# Patient Record
Sex: Female | Born: 1975
Health system: Southern US, Community
[De-identification: ages and names within clinical notes are randomized; demographics above are authoritative.]

## PROBLEM LIST (undated history)

## (undated) DIAGNOSIS — F32A Depression, unspecified: Secondary | ICD-10-CM

## (undated) DIAGNOSIS — R87629 Unspecified abnormal cytological findings in specimens from vagina: Secondary | ICD-10-CM

## (undated) DIAGNOSIS — R519 Headache, unspecified: Secondary | ICD-10-CM

## (undated) DIAGNOSIS — Z8619 Personal history of other infectious and parasitic diseases: Secondary | ICD-10-CM

## (undated) DIAGNOSIS — M797 Fibromyalgia: Secondary | ICD-10-CM

## (undated) DIAGNOSIS — F419 Anxiety disorder, unspecified: Secondary | ICD-10-CM

## (undated) DIAGNOSIS — K589 Irritable bowel syndrome without diarrhea: Secondary | ICD-10-CM

## (undated) DIAGNOSIS — E785 Hyperlipidemia, unspecified: Secondary | ICD-10-CM

## (undated) HISTORY — DX: Personal history of other infectious and parasitic diseases: Z86.19

## (undated) HISTORY — DX: Hyperlipidemia, unspecified: E78.5

## (undated) HISTORY — PX: HERNIA REPAIR: SHX51

## (undated) HISTORY — DX: Fibromyalgia: M79.7

## (undated) HISTORY — PX: CHOLECYSTECTOMY: SHX55

## (undated) HISTORY — PX: OTHER SURGICAL HISTORY: SHX169

## (undated) HISTORY — DX: Anxiety disorder, unspecified: F41.9

## (undated) HISTORY — DX: Irritable bowel syndrome, unspecified: K58.9

## (undated) HISTORY — DX: Headache, unspecified: R51.9

## (undated) HISTORY — DX: Depression, unspecified: F32.A

## (undated) HISTORY — DX: Unspecified abnormal cytological findings in specimens from vagina: R87.629

---

## 1998-10-28 ENCOUNTER — Other Ambulatory Visit: Admission: RE | Admit: 1998-10-28 | Discharge: 1998-10-28 | Payer: Self-pay | Admitting: Obstetrics and Gynecology

## 1999-10-31 ENCOUNTER — Other Ambulatory Visit: Admission: RE | Admit: 1999-10-31 | Discharge: 1999-10-31 | Payer: Self-pay | Admitting: Obstetrics and Gynecology

## 2000-11-02 ENCOUNTER — Other Ambulatory Visit: Admission: RE | Admit: 2000-11-02 | Discharge: 2000-11-02 | Payer: Self-pay | Admitting: Obstetrics and Gynecology

## 2001-05-23 ENCOUNTER — Ambulatory Visit (HOSPITAL_COMMUNITY): Admission: RE | Admit: 2001-05-23 | Discharge: 2001-05-23 | Payer: Self-pay | Admitting: Obstetrics and Gynecology

## 2001-05-23 ENCOUNTER — Encounter: Payer: Self-pay | Admitting: Obstetrics and Gynecology

## 2001-11-15 ENCOUNTER — Other Ambulatory Visit: Admission: RE | Admit: 2001-11-15 | Discharge: 2001-11-15 | Payer: Self-pay | Admitting: Obstetrics and Gynecology

## 2002-10-30 ENCOUNTER — Other Ambulatory Visit: Admission: RE | Admit: 2002-10-30 | Discharge: 2002-10-30 | Payer: Self-pay | Admitting: Obstetrics and Gynecology

## 2003-07-10 ENCOUNTER — Other Ambulatory Visit: Admission: RE | Admit: 2003-07-10 | Discharge: 2003-07-10 | Payer: Self-pay | Admitting: Obstetrics and Gynecology

## 2004-02-05 ENCOUNTER — Inpatient Hospital Stay (HOSPITAL_COMMUNITY): Admission: AD | Admit: 2004-02-05 | Discharge: 2004-02-08 | Payer: Self-pay | Admitting: Obstetrics and Gynecology

## 2006-01-19 ENCOUNTER — Inpatient Hospital Stay (HOSPITAL_COMMUNITY): Admission: AD | Admit: 2006-01-19 | Discharge: 2006-01-21 | Payer: Self-pay | Admitting: Obstetrics and Gynecology

## 2006-01-26 ENCOUNTER — Inpatient Hospital Stay (HOSPITAL_COMMUNITY): Admission: RE | Admit: 2006-01-26 | Discharge: 2006-01-28 | Payer: Self-pay | Admitting: Obstetrics and Gynecology

## 2009-10-08 ENCOUNTER — Encounter: Admission: RE | Admit: 2009-10-08 | Discharge: 2010-01-06 | Payer: Self-pay | Admitting: Orthopedic Surgery

## 2010-08-10 NOTE — L&D Delivery Note (Signed)
Delivery Note At  a viable and healthy female sex was delivered via  (Presentation:LOA ).  APGAR:9 , 9; weight pending.   Placenta status: spontaneous and intact with 3 vessel Cord:  with the following complications: none.  Cord pH: na  Anesthesia: Epidural  Episiotomy: none Lacerations: 1st degree Suture Repair: 2.0 vicryl rapide Est. Blood Loss (mL): 500  Mom to postpartum.  Baby to nursery-stable.  Deira Shimer J 06/14/2011, 5:56 PM

## 2010-11-12 LAB — RUBELLA ANTIBODY, IGM: Rubella: IMMUNE

## 2010-11-12 LAB — ABO/RH: RH Type: POSITIVE

## 2010-11-12 LAB — RPR: RPR: NONREACTIVE

## 2010-11-12 LAB — ANTIBODY SCREEN: Antibody Screen: NEGATIVE

## 2010-11-12 LAB — HIV ANTIBODY (ROUTINE TESTING W REFLEX): HIV: NONREACTIVE

## 2010-11-12 LAB — HEPATITIS B SURFACE ANTIGEN: Hepatitis B Surface Ag: NEGATIVE

## 2010-12-26 NOTE — Consult Note (Signed)
NAME:  HALIEY, ROMBERG NO.:  [id number]   MEDICAL RECORD NO.:  [id number]          PATIENT TYPE:  INP   LOCATION:  9155                          FACILITY:  [facility]   PHYSICIAN:  Lebron Conners, M.D.   DATE OF BIRTH:  [birth year]   DATE OF CONSULTATION:  DATE OF DISCHARGE:                                   CONSULTATION   Audio too short to transcribe (less than 5 seconds)      Lebron Conners, M.D.     WB/MEDQ  D:  01/19/2006  T:  01/19/2006  Job:  [id number]

## 2010-12-26 NOTE — H&P (Signed)
NAME:  Tricia [name], Tricia [name]              ACCOUNT NO.:  [id number]   MEDICAL RECORD NO.:  [id number]          PATIENT TYPE:  INP   LOCATION:  9171                          FACILITY:  [facility]   PHYSICIAN:  Lenoard Aden, M.D.DATE OF BIRTH:  Feb 03, 1976   DATE OF ADMISSION:  01/26/2006  DATE OF DISCHARGE:                                HISTORY & PHYSICAL   CHIEF COMPLAINT:  Induction.   HISTORY OF PRESENT ILLNESS:  This is a 35 year old white female, G2, P48, EDD  is February 14, 2006, at [redacted] weeks gestation who presents with a one-week history  of worsening of biliary colic, known gallstones and maintenance with  narcotics. She has a history of spontaneous vaginal delivery x1. She is a  nonsmoker, nondrinker. She denies __________ .   MEDICATIONS:  Prenatal vitamins and Vicodin p.r.n.   PAST SURGICAL HISTORY:  She has a history of inguinal hernia repair, history  of wisdom tooth removal.   PHYSICAL EXAMINATION:  GENERAL:  She is a well-developed, well-nourished,  white female in no acute distress.  HEENT:  Normal.  LUNGS:  Clear.  HEART:  Regular rhythm.  ABDOMEN:  Soft, gravid, nontender. Estimated fetal weight 6-1/2 pounds.  Cervix is 2 to 3 cm, 50% vertex, -2.  EXTREMITIES:  Showed __________ .  NEUROLOGICAL:  Nonfocal.   IMPRESSION:  9.  A 35 year old week OB.  2.  Cholecystitis secondary to biliary colic and need for continued narcotic      use.   PLAN:  Will proceed with induction. Risks and benefits discussed. Small risk  of prematurity given [redacted] week gestational age noted. The patient acknowledged  this and will proceed. Pending delivery, will have general surgery  consulting the patient and consider interval cholecystectomy.      Lenoard Aden, M.D.  Electronically Signed     RJT/MEDQ  D:  01/26/2006  T:  01/26/2006  Job:  [id number]

## 2010-12-26 NOTE — Discharge Summary (Signed)
NAME:  Tricia [name], Tricia [name]              ACCOUNT NO.:  [id number]   MEDICAL RECORD NO.:  [id number]          PATIENT TYPE:  INP   LOCATION:  9155                          [facility]ILITY:  [facility]   PHYSICIAN:  Lenoard Aden, M.D.DATE OF BIRTH:  09/10/1975   DATE OF ADMISSION:  01/19/2006  DATE OF DISCHARGE:  01/21/2006                                 DISCHARGE SUMMARY   Patient was admitted with acute biliary colic on January 19, 2006.  Surgical  consult obtained.  Patient was placed on antibiotics and bowel rest and  subsequently improved.  Tolerated regular diet well on hospital day #2.  Fetal heart rate tracing reassuring.  She is discharged to home at this  time.  Surgical follow-up scheduled.  Follow up for obstetric care within  one week.  Fetal activity discussed.  Labor warnings given.  Discharge  teaching done.  Discharge medications to include prenatal vitamins.      Lenoard Aden, M.D.  Electronically Signed     RJT/MEDQ  D:  03/02/2006  T:  03/03/2006  Job:  [id number]

## 2011-05-13 LAB — STREP B DNA PROBE: GBS: NEGATIVE

## 2011-06-09 ENCOUNTER — Encounter (HOSPITAL_COMMUNITY): Payer: Self-pay | Admitting: *Deleted

## 2011-06-09 ENCOUNTER — Telehealth (HOSPITAL_COMMUNITY): Payer: Self-pay | Admitting: *Deleted

## 2011-06-09 NOTE — Telephone Encounter (Signed)
Preadmission screen  

## 2011-06-13 ENCOUNTER — Inpatient Hospital Stay (HOSPITAL_COMMUNITY): Admission: AD | Admit: 2011-06-13 | Payer: Self-pay | Source: Ambulatory Visit | Admitting: Obstetrics and Gynecology

## 2011-06-14 ENCOUNTER — Inpatient Hospital Stay (HOSPITAL_COMMUNITY)
Admission: RE | Admit: 2011-06-14 | Discharge: 2011-06-16 | DRG: 775 | Disposition: A | Discharge status: Home / Self Care | Payer: Managed Care, Other (non HMO) | Source: Ambulatory Visit | Attending: Obstetrics & Gynecology | Admitting: Obstetrics & Gynecology

## 2011-06-14 ENCOUNTER — Encounter (HOSPITAL_COMMUNITY): Payer: Self-pay | Admitting: Anesthesiology

## 2011-06-14 ENCOUNTER — Inpatient Hospital Stay (HOSPITAL_COMMUNITY): Payer: Managed Care, Other (non HMO) | Admitting: Anesthesiology

## 2011-06-14 ENCOUNTER — Encounter (HOSPITAL_COMMUNITY): Payer: Self-pay

## 2011-06-14 DIAGNOSIS — O9903 Anemia complicating the puerperium: Secondary | ICD-10-CM | POA: Diagnosis not present

## 2011-06-14 DIAGNOSIS — O480 Post-term pregnancy: Principal | ICD-10-CM | POA: Diagnosis present

## 2011-06-14 DIAGNOSIS — D62 Acute posthemorrhagic anemia: Secondary | ICD-10-CM | POA: Diagnosis not present

## 2011-06-14 LAB — CBC
HCT: 34.7 % — ABNORMAL LOW (ref 36.0–46.0)
Hemoglobin: 11.4 g/dL — ABNORMAL LOW (ref 12.0–15.0)
MCH: 30.2 pg (ref 26.0–34.0)
MCHC: 32.9 g/dL (ref 30.0–36.0)
MCV: 91.8 fL (ref 78.0–100.0)
Platelets: 272 K/uL (ref 150–400)
RBC: 3.78 MIL/uL — ABNORMAL LOW (ref 3.87–5.11)
RDW: 14.8 % (ref 11.5–15.5)
WBC: 10.7 K/uL — ABNORMAL HIGH (ref 4.0–10.5)

## 2011-06-14 LAB — RPR: RPR Ser Ql: NONREACTIVE

## 2011-06-14 MED ORDER — OXYCODONE-ACETAMINOPHEN 5-325 MG PO TABS: 2.0000 | ORAL_TABLET | ORAL | Status: DC | PRN

## 2011-06-14 MED ORDER — ACETAMINOPHEN 325 MG PO TABS: 650.0000 mg | ORAL_TABLET | ORAL | Status: DC | PRN

## 2011-06-14 MED ORDER — PHENYLEPHRINE 40 MCG/ML (10ML) SYRINGE FOR IV PUSH (FOR BLOOD PRESSURE SUPPORT): 80.0000 ug | PREFILLED_SYRINGE | INTRAVENOUS | Status: DC | PRN

## 2011-06-14 MED ORDER — LACTATED RINGERS IV SOLN
500.0000 mL | Freq: Once | INTRAVENOUS | Status: AC
  Administered 2011-06-14: 1000 mL via INTRAVENOUS

## 2011-06-14 MED ORDER — BENZOCAINE-MENTHOL 20-0.5 % EX AERO
1.0000 "application " | INHALATION_SPRAY | CUTANEOUS | Status: DC | PRN
  Administered 2011-06-14: 1 via TOPICAL

## 2011-06-14 MED ORDER — LIDOCAINE HCL (PF) 1 % IJ SOLN: 30.0000 mL | INTRAMUSCULAR | Status: DC | PRN

## 2011-06-14 MED ORDER — METHYLERGONOVINE MALEATE 0.2 MG PO TABS: 0.2000 mg | ORAL_TABLET | ORAL | Status: DC | PRN

## 2011-06-14 MED ORDER — EPHEDRINE 5 MG/ML INJ
10.0000 mg | INTRAVENOUS | Status: DC | PRN
  Filled 2011-06-14: qty 4

## 2011-06-14 MED ORDER — IBUPROFEN 600 MG PO TABS: 600.0000 mg | ORAL_TABLET | Freq: Four times a day (QID) | ORAL | Status: DC | PRN

## 2011-06-14 MED ORDER — SIMETHICONE 80 MG PO CHEW: 80.0000 mg | CHEWABLE_TABLET | ORAL | Status: DC | PRN

## 2011-06-14 MED ORDER — LANOLIN HYDROUS EX OINT: TOPICAL_OINTMENT | CUTANEOUS | Status: DC | PRN

## 2011-06-14 MED ORDER — SENNOSIDES-DOCUSATE SODIUM 8.6-50 MG PO TABS
2.0000 | ORAL_TABLET | Freq: Every day | ORAL | Status: DC
  Administered 2011-06-14 – 2011-06-15 (×2): 2 via ORAL

## 2011-06-14 MED ORDER — FLEET ENEMA 7-19 GM/118ML RE ENEM: 1.0000 | ENEMA | RECTAL | Status: DC | PRN

## 2011-06-14 MED ORDER — ONDANSETRON HCL 4 MG/2ML IJ SOLN: 4.0000 mg | Freq: Four times a day (QID) | INTRAMUSCULAR | Status: DC | PRN

## 2011-06-14 MED ORDER — PRENATAL PLUS 27-1 MG PO TABS
1.0000 | ORAL_TABLET | Freq: Every day | ORAL | Status: DC
  Administered 2011-06-15: 1 via ORAL
  Filled 2011-06-14 (×2): qty 1

## 2011-06-14 MED ORDER — LACTATED RINGERS IV SOLN: 500.0000 mL | INTRAVENOUS | Status: DC | PRN

## 2011-06-14 MED ORDER — FENTANYL 2.5 MCG/ML BUPIVACAINE 1/10 % EPIDURAL INFUSION (WH - ANES)
INTRAMUSCULAR | Status: DC | PRN
  Administered 2011-06-14: 14 mL/h via EPIDURAL

## 2011-06-14 MED ORDER — ONDANSETRON HCL 4 MG/2ML IJ SOLN: 4.0000 mg | INTRAMUSCULAR | Status: DC | PRN

## 2011-06-14 MED ORDER — CITRIC ACID-SODIUM CITRATE 334-500 MG/5ML PO SOLN: 30.0000 mL | ORAL | Status: DC | PRN

## 2011-06-14 MED ORDER — FENTANYL 2.5 MCG/ML BUPIVACAINE 1/10 % EPIDURAL INFUSION (WH - ANES)
14.0000 mL/h | INTRAMUSCULAR | Status: DC
  Filled 2011-06-14: qty 60

## 2011-06-14 MED ORDER — DIPHENHYDRAMINE HCL 50 MG/ML IJ SOLN: 12.5000 mg | INTRAMUSCULAR | Status: DC | PRN

## 2011-06-14 MED ORDER — TETANUS-DIPHTH-ACELL PERTUSSIS 5-2.5-18.5 LF-MCG/0.5 IM SUSP: 0.5000 mL | Freq: Once | INTRAMUSCULAR | Status: DC

## 2011-06-14 MED ORDER — OXYCODONE-ACETAMINOPHEN 5-325 MG PO TABS: 1.0000 | ORAL_TABLET | ORAL | Status: DC | PRN

## 2011-06-14 MED ORDER — OXYTOCIN 20 UNITS IN LACTATED RINGERS INFUSION - SIMPLE
125.0000 mL/h | Freq: Once | INTRAVENOUS | Status: AC
  Administered 2011-06-14: 999 mL/h via INTRAVENOUS

## 2011-06-14 MED ORDER — IBUPROFEN 600 MG PO TABS
600.0000 mg | ORAL_TABLET | Freq: Four times a day (QID) | ORAL | Status: DC
  Administered 2011-06-14 – 2011-06-16 (×6): 600 mg via ORAL
  Filled 2011-06-14 (×6): qty 1

## 2011-06-14 MED ORDER — ZOLPIDEM TARTRATE 5 MG PO TABS: 5.0000 mg | ORAL_TABLET | Freq: Every evening | ORAL | Status: DC | PRN

## 2011-06-14 MED ORDER — PHENYLEPHRINE 40 MCG/ML (10ML) SYRINGE FOR IV PUSH (FOR BLOOD PRESSURE SUPPORT)
80.0000 ug | PREFILLED_SYRINGE | INTRAVENOUS | Status: DC | PRN
  Filled 2011-06-14: qty 5

## 2011-06-14 MED ORDER — DIPHENHYDRAMINE HCL 25 MG PO CAPS: 25.0000 mg | ORAL_CAPSULE | Freq: Four times a day (QID) | ORAL | Status: DC | PRN

## 2011-06-14 MED ORDER — WITCH HAZEL-GLYCERIN EX PADS: 1.0000 "application " | MEDICATED_PAD | CUTANEOUS | Status: DC | PRN

## 2011-06-14 MED ORDER — LACTATED RINGERS IV SOLN
INTRAVENOUS | Status: DC
  Administered 2011-06-14: 11:00:00 via INTRAVENOUS

## 2011-06-14 MED ORDER — BENZOCAINE-MENTHOL 20-0.5 % EX AERO
INHALATION_SPRAY | CUTANEOUS | Status: AC
  Administered 2011-06-14: 1 via TOPICAL
  Filled 2011-06-14: qty 56

## 2011-06-14 MED ORDER — ONDANSETRON HCL 4 MG PO TABS: 4.0000 mg | ORAL_TABLET | ORAL | Status: DC | PRN

## 2011-06-14 MED ORDER — EPHEDRINE 5 MG/ML INJ: 10.0000 mg | INTRAVENOUS | Status: DC | PRN

## 2011-06-14 MED ORDER — OXYTOCIN 20 UNITS IN LACTATED RINGERS INFUSION - SIMPLE
1.0000 m[IU]/min | INTRAVENOUS | Status: DC
  Administered 2011-06-14: 2 m[IU]/min via INTRAVENOUS
  Filled 2011-06-14: qty 1000

## 2011-06-14 MED ORDER — TERBUTALINE SULFATE 1 MG/ML IJ SOLN: 0.2500 mg | Freq: Once | INTRAMUSCULAR | Status: DC | PRN

## 2011-06-14 MED ORDER — OXYTOCIN BOLUS FROM INFUSION
500.0000 mL | Freq: Once | INTRAVENOUS | Status: DC
  Filled 2011-06-14: qty 500

## 2011-06-14 MED ORDER — DIBUCAINE 1 % RE OINT: 1.0000 "application " | TOPICAL_OINTMENT | RECTAL | Status: DC | PRN

## 2011-06-14 MED ORDER — SODIUM BICARBONATE 8.4 % IV SOLN
INTRAVENOUS | Status: DC | PRN
  Administered 2011-06-14: 4 mL via EPIDURAL

## 2011-06-14 MED ORDER — METHYLERGONOVINE MALEATE 0.2 MG/ML IJ SOLN: 0.2000 mg | INTRAMUSCULAR | Status: DC | PRN

## 2011-06-14 NOTE — Anesthesia Preprocedure Evaluation (Signed)
Anesthesia Evaluation  Patient identified by MRN, date of birth, ID band Patient awake    Reviewed: Allergy & Precautions, H&P , Patient's Chart, lab work & pertinent test results  Airway Mallampati: II TM Distance: >3 FB Neck ROM: full    Dental  (+) Teeth Intact   Pulmonary  clear to auscultation        Cardiovascular regular Normal    Neuro/Psych    GI/Hepatic   Endo/Other  Morbid obesity  Renal/GU      Musculoskeletal   Abdominal   Peds  Hematology   Anesthesia Other Findings       Reproductive/Obstetrics (+) Pregnancy                           Anesthesia Physical Anesthesia Plan  ASA: III  Anesthesia Plan: Epidural   Post-op Pain Management:    Induction:   Airway Management Planned:   Additional Equipment:   Intra-op Plan:   Post-operative Plan:   Informed Consent: I have reviewed the patients History and Physical, chart, labs and discussed the procedure including the risks, benefits and alternatives for the proposed anesthesia with the patient or authorized representative who has indicated his/her understanding and acceptance.   Dental Advisory Given  Plan Discussed with:   Anesthesia Plan Comments: (Labs checked- platelets confirmed with RN in room. Fetal heart tracing, per RN, reported to be stable enough for sitting procedure. Discussed epidural, and patient consents to the procedure:  included risk of possible headache,backache, failed block, allergic reaction, and nerve injury. This patient was asked if she had any questions or concerns before the procedure started. )        Anesthesia Quick Evaluation

## 2011-06-14 NOTE — Progress Notes (Signed)
Tricia [name] is a 35 y.o. [id number] at [redacted]w[redacted]d by LMP admitted for induction of labor due to postterm IUP.  Subjective:   Objective: BP 121/82  Pulse 75  Temp(Src) 98.4 F (36.9 C) (Oral)  Resp 20  Ht 5\' 1"  (1.549 m)  Wt 88.905 kg (196 lb)  BMI 37.03 kg/m2  LMP 08/29/2010      FHT:  FHR: 155 bpm, variability: moderate,  accelerations:  Present,  decelerations:  Absent UC:   irregular, every 4-5 minutes SVE:   Dilation: 3 Effacement (%): 50 Station: -2 Exam by:: k fields, rn AROM- clear  Labs: Lab Results  Component Value Date   WBC 10.7* 06/14/2011   HGB 11.4* 06/14/2011   HCT 34.7* 06/14/2011   MCV 91.8 06/14/2011   PLT 272 06/14/2011    Assessment / Plan: Induction of labor due to postterm,  progressing well on pitocin  Labor: Progressing on Pitocin, will continue to increase then AROM Preeclampsia:  na Fetal Wellbeing:  Category I Pain Control:  Labor support without medications and Epidural I/D:  n/a Anticipated MOD:  NSVD  Matthias Bogus J 06/14/2011, 1:12 PM

## 2011-06-14 NOTE — H&P (Signed)
Tricia [name], LOCHNER NO.:  [id number]  MEDICAL RECORD NO.:  [id number]  LOCATION:  9167                          FACILITY:  [facility]  PHYSICIAN:  Lenoard Aden, M.D.DATE OF BIRTH:  [birth year]  DATE OF ADMISSION:  06/14/2011 DATE OF DISCHARGE:                             HISTORY & PHYSICAL   CHIEF COMPLAINT:  Induction for postterm.  HISTORY OF PRESENT ILLNESS:  She is a 35 year old white female, G4, P2 at 61 weeks' gestation with favorable cervix induction.  ALLERGIES:  She has allergies to SULFA.  MEDICATIONS:  Prenatal vitamins.  PAST MEDICAL HISTORY:  She has a personal history of sinuitis.  SURGICAL HISTORY:  Remarkable for a wisdom tooth removal, inguinal hernia repair, and cholecystectomy.  SOCIAL HISTORY:  She is a nonsmoker, nondrinker.  She denies domestic or physical violence.  OB/GYN HISTORY:  She has a history of 2 term vaginal deliveries and one SAP.  FAMILY HISTORY:  Parkinson disease and chronic hypertension.  PRENATAL COURSE:  Uncomplicated.  PHYSICAL EXAMINATION:  GENERAL:  A well-developed, well-nourished female in no acute distress. HEENT:  Normal. NECK:  Supple.  Full range of motion. LUNGS:  Clear. HEART:  Regular rate and rhythm. ABDOMEN:  Soft, gravid, and nontender.  Fetal weight 7 to 7-1/2 pounds. Cervix is 3 cm, 100% vertex, -1. EXTREMITIES:  No cords. NEUROLOGIC:  Nonfocal. SKIN:  Intact.  IMPRESSION:  Postterm intrauterine pregnancy with favorable cervix induction.  PLAN:  Continue Pitocin epidural as needed.     Lenoard Aden, M.D.     RJT/MEDQ  D:  06/14/2011  T:  06/14/2011  Job:  [id number]

## 2011-06-14 NOTE — Anesthesia Procedure Notes (Signed)

## 2011-06-15 LAB — CBC
MCH: 30.5 pg (ref 26.0–34.0)
MCV: 91.7 fL (ref 78.0–100.0)
Platelets: 227 10*3/uL (ref 150–400)
RBC: 3.15 MIL/uL — ABNORMAL LOW (ref 3.87–5.11)

## 2011-06-15 MED ORDER — POLYSACCHARIDE IRON 150 MG PO CAPS
150.0000 mg | ORAL_CAPSULE | Freq: Every day | ORAL | Status: DC
  Administered 2011-06-15: 150 mg via ORAL
  Filled 2011-06-15 (×2): qty 1

## 2011-06-15 MED ORDER — DOCUSATE SODIUM 100 MG PO CAPS
100.0000 mg | ORAL_CAPSULE | Freq: Every day | ORAL | Status: DC
  Administered 2011-06-15: 100 mg via ORAL
  Filled 2011-06-15 (×2): qty 1

## 2011-06-15 NOTE — Progress Notes (Signed)
  PPD 1 SVD  S: Reports feeling well - minimal discomfort with cramping            Tolerating po/ No nausea or vomiting            Bleeding is moderate            Pain controlled prescription NSAID's including motrin and narcotic analgesics including percocet            Up ad lib / ambulatory            Newborn breast feeding  / female   O:  A & O x 3 NAD             VS: Blood pressure 99/66, pulse 82, temperature 98.1 F (36.7 C), temperature source Oral, resp. rate 18, height 5\' 1"  (1.549 m), weight 88.905 kg (196 lb), last menstrual period 08/29/2010, SpO2 98.00%, unknown if currently breastfeeding.  LABS: Lab Results  Component Value Date   WBC 14.2* 06/15/2011   HGB 9.6* 06/15/2011   HCT 28.9* 06/15/2011   MCV 91.7 06/15/2011   PLT 227 06/15/2011     Lungs: Clear and unlabored  Heart: regular rate and rhythm / no mumurs  Abdomen: soft, non-tender, non-distended             Fundus: firm, non-tender, Ueven  Perineum: mild edema - ice pack in place  Lochia: moderate  Extremities: trace edema, no calf pain or tenderness    A: PPD # 1 SVD female             Mild anemia   Doing well - stable status  P:  Routine post partum orders              Start iron (continue x 4 weeks)  Anticipate discharge in am  BAILEY,TANYA 06/15/2011, 8:59 AM

## 2011-06-15 NOTE — Anesthesia Postprocedure Evaluation (Signed)
Anesthesia Post Note  Patient: Tricia [name]  Procedure(s) Performed: * No procedures listed *  Anesthesia type: Epidural  Patient location: Mother/Baby  Post pain: Pain level controlled  Post assessment: Post-op Vital signs reviewed  Last Vitals:  Filed Vitals:   06/15/11 1412  BP: 113/81  Pulse: 71  Temp: 36.6 C  Resp: 18    Post vital signs: Reviewed  Level of consciousness:alert  Complications: No apparent anesthesia complications

## 2011-06-15 NOTE — Addendum Note (Signed)
Addendum  created 06/15/11 1715 by Rachell Druckenmiller   Modules edited:Charges VN, Notes Section    

## 2011-06-15 NOTE — Addendum Note (Signed)
Addendum  created 06/15/11 1715 by Cephus Shelling   Modules edited:Charges VN, Notes Section

## 2011-06-16 MED ORDER — POLYSACCHARIDE IRON 150 MG PO CAPS: 150.0000 mg | ORAL_CAPSULE | Freq: Every day | ORAL | Status: DC

## 2011-06-16 MED ORDER — OXYCODONE-ACETAMINOPHEN 5-325 MG PO TABS: 1.0000 | ORAL_TABLET | Freq: Four times a day (QID) | ORAL | Status: AC | PRN

## 2011-06-16 NOTE — Discharge Summary (Signed)
Obstetric Discharge Summary Reason for Admission: G4, P2 for induction of labor d/t post dates Prenatal Procedures: ultrasound Intrapartum Procedures: SVD with 1st degree repair Postpartum Procedures: none Complications-Operative and Postpartum: none Hemoglobin  Date Value Range Status  06/15/2011 9.6* 12.0-15.0 (g/dL) Final     HCT  Date Value Range Status  06/15/2011 28.9* 36.0-46.0 (%) Final    Discharge Diagnoses: Term Pregnancy-delivered  Discharge Information: Date: 06/16/2011 Activity: pelvic rest Diet: routine Medications: Ibuprofen Condition: stable Instructions: refer to practice specific booklet Discharge to: home   Newborn Data: Live born female on 05/13/11 Birth Weight: 6 lb 14 oz (3118 g) APGAR: 9, 9  Home with mother.  Genessa Beman K 06/16/2011, 8:34 AM

## 2011-06-16 NOTE — Progress Notes (Signed)
  PPD # 2  Subjective: Pt reports feeling well and eager for d/c home/ Pain controlled with motrin and percocet Tolerating po/ Voiding without problems/ No n/v Bleeding is light/ Newborn info:  Information for the patient's newborn:  Dimaano, Girl Lakshmi [[id number]]  female  / Feeding: breast    Objective:  VS: Blood pressure 116/81, pulse 91, temperature 98.1 F (36.7 C), temperature source Oral, resp. rate 18   Basename 06/15/11 0530 06/14/11 1015  WBC 14.2* 10.7*  HGB 9.6* 11.4*  HCT 28.9* 34.7*  PLT 227 272    Blood type: --/--/O POS (11/04 1015) Rubella: Immune (04/04 0000)    Physical Exam:  General: alert, cooperative and no distress Abdomen: soft, nontender, normal bowel sounds Uterine Fundus: firm, below umbilicus, nontender Perineum: mild edema Lochia: minimal Ext: Homans sign is negative, no sign of DVT and no edema, redness or tenderness in the calves or thighs   A/P: PPD # 2/ [id number] S/P SVD Mild ABL Anemia Doing well and stable for discharge home RX: Ibuprofen 600mg  po Q 6 hrs prn pain #30 Refill x 1 Percocet 5/325 1 to 2 po Q 4 hrs prn pain #15 No refill WOB/GYN booklet given Routine pp visit in 6wks

## 2013-11-01 ENCOUNTER — Other Ambulatory Visit: Payer: Self-pay | Admitting: *Deleted

## 2013-11-01 DIAGNOSIS — R7309 Other abnormal glucose: Secondary | ICD-10-CM

## 2013-11-03 ENCOUNTER — Other Ambulatory Visit: Payer: 59

## 2013-11-03 ENCOUNTER — Other Ambulatory Visit: Payer: Self-pay | Admitting: Obstetrics

## 2013-11-03 DIAGNOSIS — R7309 Other abnormal glucose: Secondary | ICD-10-CM

## 2013-11-06 LAB — GLUCOSE TOLERANCE, 2 HOURS W/ 1HR
GLUCOSE, FASTING: 75 mg/dL (ref 70–99)
Glucose, 1 hour: 96 mg/dL (ref 70–170)
Glucose, 2 hour: 78 mg/dL (ref 70–139)

## 2013-12-04 ENCOUNTER — Other Ambulatory Visit: Payer: Self-pay | Admitting: Obstetrics

## 2013-12-04 DIAGNOSIS — J329 Chronic sinusitis, unspecified: Secondary | ICD-10-CM

## 2013-12-04 DIAGNOSIS — E669 Obesity, unspecified: Secondary | ICD-10-CM

## 2013-12-04 MED ORDER — AMOXICILLIN-POT CLAVULANATE 875-125 MG PO TABS: 1.0000 | ORAL_TABLET | Freq: Two times a day (BID) | ORAL | Status: DC

## 2013-12-04 MED ORDER — PHENTERMINE HCL 37.5 MG PO CAPS
37.5000 mg | ORAL_CAPSULE | ORAL | Status: DC
Start: 2013-12-04 — End: 2016-03-30

## 2014-01-16 ENCOUNTER — Ambulatory Visit: Payer: Managed Care, Other (non HMO) | Admitting: *Deleted

## 2014-03-01 ENCOUNTER — Encounter: Payer: Self-pay | Admitting: *Deleted

## 2014-03-01 ENCOUNTER — Encounter: Payer: 59 | Attending: Obstetrics and Gynecology | Admitting: *Deleted

## 2014-03-01 DIAGNOSIS — Z713 Dietary counseling and surveillance: Secondary | ICD-10-CM | POA: Diagnosis present

## 2014-03-01 DIAGNOSIS — E7800 Pure hypercholesterolemia, unspecified: Secondary | ICD-10-CM | POA: Diagnosis present

## 2014-03-01 NOTE — Progress Notes (Signed)
  Medical Nutrition Therapy:  Appt start time: 0800 end time:  0900.  Assessment:  Primary concerns today: Tricia [name] is here for nutrition counseling. 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  She is interested in general nutrition counseling: what to eat, what not to eat, how to read labels, etc.  She would like for her family to eat healthier.  She states that her total cholesterol is slightly elevated (211 mg/dl).   Tricia [name] does the grocery shopping for her household.  The family eats out a fairy amount and don't cook much at home. (4 nights/week)  Tricia [name] and her husband share cooking responsibilities.  They bake or grill most of the time and rarely fry.  When at home, they eat in the living room while watching tv; she thinks she is a slow eater.    Preferred Learning Style:   Auditory  Visual   Learning Readiness:   Contemplating   MEDICATIONS: see list   DIETARY INTAKE:  Usual eating pattern includes 1-3 meals and 0-3 snacks per day.  Everyday foods include proteins, starches.  Avoided foods include none.    24-hr recall:  B ( AM): granola bar or 1/2 egg sandwich from fast food.  Might skip .  On weekends might have bowl cereal or oatmeal.  Sometimes goes out and gets eggs and toast (even though she really wants the french toast) Snk ( AM): not at work  L ( PM): might have drug rep bring in something: soup and sandwich; if she brings from home she might have lean cousine with goldfish and applesauce.  Might skip on weekends.  Big lunch at [place] ( PM): not at work unless she eats leftover goldfish from lunch.  Grazes more on the weekends on popcorn or granola bar D ( PM): spaghetti or tacos if cook at home.  [place], pizza, [place].  .  Take out on weekends.  Sometimes light dinner on sundays due to big lunch Snk ( PM): not usually.  Might have a little pudding cup or popsicle Beverages: water with  sugar-free mix in, tea (excessive)  Usual physical activity: none outside of ADLs  Estimated energy needs: 1800 calories 200 g carbohydrates 135 g protein 50 g fat    Nutritional Diagnosis:  NB-1.1 Food and nutrition-related knowledge deficit As related to proper balance of fats, carbohydrates, and proteins.  As evidenced by patient self-reported knowledge deficit.    Intervention:  Nutrition counseling provideded.  Discussed saturated, unsaturated, and trans fats and their roles on heart health.  Recommended limiting saturated and trans fats.  Discussed reading food labels for these fats and also fiber.  Suggested planning meals in advance so the family doesn't have to eat out as often during the week.  Tricia [name] agrees this is a good idea, but she doesn't seem to think it is possible to plan out in advance.    Teaching Method Utilized:  Visual Auditory   Handouts given during visit include:  High Cholesterol Nutrition Therapy  Reading Food Labels  Barriers to learning/adherence to lifestyle change: prioritizing meal planning  Demonstrated degree of understanding via:  Teach Back   Monitoring/Evaluation:  Dietary intake, exercise, and body weight prn.

## 2014-06-11 ENCOUNTER — Encounter: Payer: Self-pay | Admitting: *Deleted

## 2014-07-09 ENCOUNTER — Other Ambulatory Visit: Payer: Self-pay | Admitting: Obstetrics

## 2014-07-09 DIAGNOSIS — R05 Cough: Secondary | ICD-10-CM

## 2014-07-09 DIAGNOSIS — R059 Cough, unspecified: Secondary | ICD-10-CM

## 2014-07-09 MED ORDER — GUAIFENESIN-CODEINE 100-10 MG/5ML PO SOLN: 10.0000 mL | Freq: Four times a day (QID) | ORAL | Status: DC | PRN

## 2014-07-23 ENCOUNTER — Other Ambulatory Visit: Payer: Self-pay | Admitting: *Deleted

## 2014-07-23 DIAGNOSIS — J069 Acute upper respiratory infection, unspecified: Secondary | ICD-10-CM

## 2014-07-23 MED ORDER — AZITHROMYCIN 1 G PO PACK: 1.0000 g | PACK | Freq: Once | ORAL | Status: DC

## 2014-07-24 ENCOUNTER — Other Ambulatory Visit: Payer: Self-pay | Admitting: *Deleted

## 2014-07-24 DIAGNOSIS — J069 Acute upper respiratory infection, unspecified: Secondary | ICD-10-CM

## 2014-07-24 MED ORDER — AZITHROMYCIN 250 MG PO TABS
ORAL_TABLET | ORAL | Status: DC
Start: 2014-07-24 — End: 2015-07-02

## 2015-07-02 ENCOUNTER — Other Ambulatory Visit: Payer: Self-pay | Admitting: *Deleted

## 2015-07-02 DIAGNOSIS — J069 Acute upper respiratory infection, unspecified: Secondary | ICD-10-CM

## 2015-07-02 MED ORDER — AZITHROMYCIN 250 MG PO TABS
ORAL_TABLET | ORAL | Status: DC
Start: 2015-07-02 — End: 2016-03-30

## 2015-10-01 ENCOUNTER — Telehealth: Payer: Self-pay | Admitting: *Deleted

## 2015-10-01 ENCOUNTER — Telehealth: Payer: Self-pay

## 2015-10-01 DIAGNOSIS — M25562 Pain in left knee: Secondary | ICD-10-CM

## 2015-10-01 NOTE — Telephone Encounter (Signed)
Sch appt with [facility] on 3/8 at 8:15am per her request

## 2015-10-10 NOTE — Telephone Encounter (Signed)
Referral for ortho.

## 2015-10-16 DIAGNOSIS — M1712 Unilateral primary osteoarthritis, left knee: Secondary | ICD-10-CM | POA: Diagnosis not present

## 2015-11-20 MED FILL — BUTALB-ACETAMIN-CAFF 50-325: 50-325-40 | 6 days supply | Qty: 40 | Fill #0

## 2015-11-29 DIAGNOSIS — M1712 Unilateral primary osteoarthritis, left knee: Secondary | ICD-10-CM | POA: Diagnosis not present

## 2015-12-23 DIAGNOSIS — L700 Acne vulgaris: Secondary | ICD-10-CM | POA: Diagnosis not present

## 2015-12-23 DIAGNOSIS — D225 Melanocytic nevi of trunk: Secondary | ICD-10-CM | POA: Diagnosis not present

## 2015-12-23 DIAGNOSIS — L814 Other melanin hyperpigmentation: Secondary | ICD-10-CM | POA: Diagnosis not present

## 2016-03-23 DIAGNOSIS — H52223 Regular astigmatism, bilateral: Secondary | ICD-10-CM | POA: Diagnosis not present

## 2016-03-30 ENCOUNTER — Ambulatory Visit (INDEPENDENT_AMBULATORY_CARE_PROVIDER_SITE_OTHER): Payer: 59 | Admitting: Family Medicine

## 2016-03-30 ENCOUNTER — Encounter: Payer: Self-pay | Admitting: Family Medicine

## 2016-03-30 VITALS — BP 104/72 | HR 88 | Temp 98.5°F | Ht 61.0 in | Wt 182.6 lb

## 2016-03-30 DIAGNOSIS — Z13220 Encounter for screening for lipoid disorders: Secondary | ICD-10-CM | POA: Diagnosis not present

## 2016-03-30 DIAGNOSIS — S46819A Strain of other muscles, fascia and tendons at shoulder and upper arm level, unspecified arm, initial encounter: Secondary | ICD-10-CM

## 2016-03-30 DIAGNOSIS — Z130 Encounter for screening for diseases of the blood and blood-forming organs and certain disorders involving the immune mechanism: Secondary | ICD-10-CM

## 2016-03-30 DIAGNOSIS — Z1329 Encounter for screening for other suspected endocrine disorder: Secondary | ICD-10-CM | POA: Diagnosis not present

## 2016-03-30 DIAGNOSIS — R519 Headache, unspecified: Secondary | ICD-10-CM

## 2016-03-30 DIAGNOSIS — Z131 Encounter for screening for diabetes mellitus: Secondary | ICD-10-CM

## 2016-03-30 DIAGNOSIS — R51 Headache: Secondary | ICD-10-CM | POA: Diagnosis not present

## 2016-03-30 LAB — COMPREHENSIVE METABOLIC PANEL
ALT: 14 U/L (ref 0–35)
AST: 18 U/L (ref 0–37)
Albumin: 4.2 g/dL (ref 3.5–5.2)
Alkaline Phosphatase: 84 U/L (ref 39–117)
BUN: 11 mg/dL (ref 6–23)
CHLORIDE: 101 meq/L (ref 96–112)
CO2: 28 mEq/L (ref 19–32)
Calcium: 9.5 mg/dL (ref 8.4–10.5)
Creatinine, Ser: 0.69 mg/dL (ref 0.40–1.20)
GFR: 100.74 mL/min (ref >60.00)
GLUCOSE: 93 mg/dL (ref 70–99)
POTASSIUM: 3.6 meq/L (ref 3.5–5.1)
Sodium: 139 mEq/L (ref 135–145)
Total Bilirubin: 0.5 mg/dL (ref 0.2–1.2)
Total Protein: 7.4 g/dL (ref 6.0–8.3)

## 2016-03-30 LAB — LDL CHOLESTEROL, DIRECT: Direct LDL: 142 mg/dL

## 2016-03-30 LAB — LIPID PANEL
CHOL/HDL RATIO: 4
CHOLESTEROL: 240 mg/dL — AB (ref 0–200)
HDL: 53.8 mg/dL (ref >39.00)
NonHDL: 186.13
Triglycerides: 387 mg/dL — ABNORMAL HIGH (ref 0.0–149.0)
VLDL: 77.4 mg/dL — AB (ref 0.0–40.0)

## 2016-03-30 LAB — CBC
HEMATOCRIT: 42.1 % (ref 36.0–46.0)
Hemoglobin: 14.6 g/dL (ref 12.0–15.0)
MCHC: 34.6 g/dL (ref 30.0–36.0)
MCV: 94 fl (ref 78.0–100.0)
PLATELETS: 321 10*3/uL (ref 150.0–400.0)
RBC: 4.48 Mil/uL (ref 3.87–5.11)
RDW: 13.4 % (ref 11.5–15.5)
WBC: 11.7 10*3/uL — ABNORMAL HIGH (ref 4.0–10.5)

## 2016-03-30 LAB — TSH: TSH: 1.28 u[IU]/mL (ref 0.35–4.50)

## 2016-03-30 LAB — HEMOGLOBIN A1C: HEMOGLOBIN A1C: 5.6 % (ref 4.6–6.5)

## 2016-03-30 MED ORDER — CYCLOBENZAPRINE HCL 10 MG PO TABS: ORAL_TABLET | ORAL | 0 refills | Status: DC

## 2016-03-30 NOTE — Patient Instructions (Signed)
It was very nice to see you today- please go to lab for a blood draw, and I will be in touch with your labs asap  We will get you back into PT to work on your shoulder and neck pain If your headaches do not improve, please let me know and we can do further imaging for you Try the flexeril as needed for muscle aches, but remember this can make you sleepy- do not take it when you need to drive You may have sleep apnea- if your labs are normal we can do a sleep study for you. Let me know if you are interested in doing this test

## 2016-03-30 NOTE — Progress Notes (Signed)
Pre visit review using our clinic review tool, if applicable. No additional management support is needed unless otherwise documented below in the visit note. 

## 2016-03-30 NOTE — Progress Notes (Signed)
[facility] [address], [place], [place] [address] [phone]  Date:  03/30/2016   Name:  Tricia [name]   DOB:  10/24/1975   MRN:  [id number]  PCP:  No PCP Per Patient    Chief Complaint: Establish Care (Pt here to est. c/o frequent HA, neck and back pain.)   History of Present Illness:  Tricia [name] is a 40 y.o. very pleasant female patient who presents with the following:  Here today as a new patient.  She is generally in good health- she is establishing with primary care.   She is here today with her 3 children- they are 93, 61 and 66 yo.    She has a history of scoliosis; dx around age 28 yo.  She never needed surgery for this Over the last 5 years or so she has had shoulder trouble.  Did some PT for a while prior to the birth of her youngest, but then could not continue due to insurance changes She has noted more frequent HA over the last 3-6 months.   She may wake up with a HA that might last for 2-3 days. She may use excedrin or another OTC medication. She has tried fioricet as well but it does not help that much.   The HA are generally in the back of her head.   No nausea or vomiting. She may have light sensitivity at times No aura.    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   She may have a HA once every 1-2 weeks here recently  She otherwise feels well- no fever, cough, ST, GI symptoms She has an IUD and is not concerned about pregnancy   No very severe HA, no neurological sx noted   Patient Active Problem List   Diagnosis Date Noted  . Postpartum care following vaginal delivery (11/4) 06/15/2011    Past Medical History:  Diagnosis Date  . Allergic rhinitis   . History of chicken pox   . History of shingles   . Hyperlipidemia     Past Surgical History:  Procedure Laterality Date  . CHOLECYSTECTOMY    . HERNIA REPAIR    . wisdome teeth      Social History  Substance Use  Topics  . Smoking status: Never Smoker  . Smokeless tobacco: Never Used  . Alcohol use No    Family History  Problem Relation Age of Onset  . Hypertension Maternal Grandmother   . Hypertension Maternal Grandfather   . Hypertension Paternal Grandmother   . Heart disease Paternal Grandmother   . Hypertension Paternal Grandfather   . Heart disease Paternal Grandfather   . Anesthesia problems Neg Hx   . Hypotension Neg Hx   . Pseudochol deficiency Neg Hx   . Malignant hyperthermia Neg Hx     Allergies  Allergen Reactions  . Sulfa Antibiotics     Childhood reaction    Medication list has been reviewed and updated.  No current outpatient prescriptions on file prior to visit.   No current facility-administered medications on file prior to visit.     Review of Systems:  As per HPI- otherwise negative.   Physical Examination: Vitals:   03/30/16 1353  BP: 104/72  Pulse: 88  Temp: 98.5 F (36.9 C)   Vitals:   03/30/16 1353  Weight: 182 lb 9.6 oz (82.8 kg)  Height: 5\' 1"  (1.549 m)  Body mass index is 34.5 kg/m. Ideal Body Weight: Weight in (lb) to have BMI = 25: 132  GEN: WDWN, NAD, Non-toxic, A & O x 3, overweight, looks well HEENT: Atraumatic, Normocephalic. Neck supple. No masses, No LAD.  Bilateral TM wnl, oropharynx normal.  PEERL,EOMI.   She has some tenderness over the bilateral trapezius and latissimus muscles.  Normal ROM of both shoudders Ears and Nose: No external deformity. CV: RRR, No M/G/R. No JVD. No thrill. No extra heart sounds. PULM: CTA B, no wheezes, crackles, rhonchi. No retractions. No resp. distress. No accessory muscle use. ABD: S, NT, ND, +BS. No rebound. No HSM. EXTR: No c/c/e NEURO Normal gait.   Normal strength, sensation and DTR of all limbs, normal movement of facial muscles.   She has mild thoracic scoliosis evident on exam PSYCH: Normally interactive. Conversant. Not depressed or anxious appearing.  Calm demeanor.    Assessment  and Plan: Trapezius muscle strain, unspecified laterality, initial encounter - Plan: cyclobenzaprine (FLEXERIL) 10 MG tablet, Ambulatory referral to Physical Therapy  Screening for hyperlipidemia - Plan: Lipid panel  Frequent headaches  Screening for thyroid disorder - Plan: TSH  Screening for diabetes mellitus - Plan: Comprehensive metabolic panel, Hemoglobin A1c  Screening for deficiency anemia - Plan: CBC  Here today with muscle aches and HA as above.  At this time her HA have been present for months and are not particularly severe.  No other neurological sx.  Her sx may be due to muscle tension Will try flexeril, and refer to PT She is advised to let me know if any changes or worsening of her HA. Asked to seek ER care if any very severe or unusual HA and she agrees  She also mentions that her husband says that she snores. Offered to arrange a sleep study- for the time being she declines but she will keep this in mind.   See patient instructions for more details.  Results for orders placed or performed in visit on 03/30/16  CBC  Result Value Ref Range   WBC 11.7 (H) 4.0 - 10.5 K/uL   RBC 4.48 3.87 - 5.11 Mil/uL   Platelets 321.0 150.0 - 400.0 K/uL   Hemoglobin 14.6 12.0 - 15.0 g/dL   HCT 42.1 36.0 - 46.0 %   MCV 94.0 78.0 - 100.0 fl   MCHC 34.6 30.0 - 36.0 g/dL   RDW 13.4 11.5 - 15.5 %  Comprehensive metabolic panel  Result Value Ref Range   Sodium 139 135 - 145 mEq/L   Potassium 3.6 3.5 - 5.1 mEq/L   Chloride 101 96 - 112 mEq/L   CO2 28 19 - 32 mEq/L   Glucose, Bld 93 70 - 99 mg/dL   BUN 11 6 - 23 mg/dL   Creatinine, Ser 0.69 0.40 - 1.20 mg/dL   Total Bilirubin 0.5 0.2 - 1.2 mg/dL   Alkaline Phosphatase 84 39 - 117 U/L   AST 18 0 - 37 U/L   ALT 14 0 - 35 U/L   Total Protein 7.4 6.0 - 8.3 g/dL   Albumin 4.2 3.5 - 5.2 g/dL   Calcium 9.5 8.4 - 10.5 mg/dL   GFR 100.74 >60.00 mL/min  Lipid panel  Result Value Ref Range   Cholesterol 240 (H) 0 - 200 mg/dL    Triglycerides 387.0 (H) 0.0 - 149.0 mg/dL   HDL 53.80 >39.00 mg/dL   VLDL 77.4 (H) 0.0 - 40.0 mg/dL   Total CHOL/HDL Ratio 4    NonHDL  186.13   TSH  Result Value Ref Range   TSH 1.28 0.35 - 4.50 uIU/mL  Hemoglobin A1c  Result Value Ref Range   Hgb A1c MFr Bld 5.6 4.6 - 6.5 %  LDL cholesterol, direct  Result Value Ref Range   Direct LDL 142.0 mg/dL        Signed Lamar Blinks, M

## 2016-03-31 ENCOUNTER — Encounter: Payer: Self-pay | Admitting: Family Medicine

## 2016-04-02 ENCOUNTER — Encounter: Payer: Self-pay | Admitting: Family Medicine

## 2016-04-20 ENCOUNTER — Ambulatory Visit: Payer: 59 | Attending: Family Medicine | Admitting: Physical Therapy

## 2016-04-20 DIAGNOSIS — M6281 Muscle weakness (generalized): Secondary | ICD-10-CM | POA: Insufficient documentation

## 2016-04-20 DIAGNOSIS — M542 Cervicalgia: Secondary | ICD-10-CM | POA: Insufficient documentation

## 2016-04-20 DIAGNOSIS — R293 Abnormal posture: Secondary | ICD-10-CM | POA: Insufficient documentation

## 2016-04-20 NOTE — Therapy (Signed)
[facility] [address]  [place], [place], [address] Phone: [phone]   Fax:  [phone]  Physical Therapy Evaluation  Patient Details  Name: Tricia [name] MRN: [id number] Date of Birth: 01-25-1976 Referring Provider: Lamar Blinks, MD  Encounter Date: 04/20/2016      PT End of Session - 04/20/16 1444    Visit Number 1   Number of Visits 12   Date for PT Re-Evaluation 06/01/16   PT Start Time 1400   PT Stop Time 1441   PT Time Calculation (min) 41 min   Activity Tolerance Patient tolerated treatment well   Behavior During Therapy [facility] for tasks assessed/performed      Past Medical History:  Diagnosis Date  . Allergic rhinitis   . History of chicken pox   . History of shingles   . Hyperlipidemia     Past Surgical History:  Procedure Laterality Date  . CHOLECYSTECTOMY    . HERNIA REPAIR    . wisdome teeth      There were no vitals filed for this visit.       Subjective Assessment - 04/20/16 1401    Subjective Pt is a 40 y/o female who presents to OPPT with 4-5 year hx of chronic neck pain and headaches.  Pt reports associated shoulder pain but this has improved.  Pt was going to PT but had to stop due to coverage.  Pt reports some numbness and tingling in bil UEs.   Pertinent History moderate carpal tunnel   Limitations Sitting;House hold activities   Diagnostic tests nothing recent   Patient Stated Goals improve tightness, decrease headaches   Currently in Pain? No/denies   Pain Score --  up to 6/10   Pain Location Neck   Pain Orientation Upper   Pain Descriptors / Indicators Headache;Tightness   Pain Type Chronic pain   Pain Onset More than a month ago   Pain Frequency Intermittent   Aggravating Factors  nothing that she can pinpoint   Pain Relieving Factors nothing            [facility] PT Assessment - 04/20/16 1408      Assessment   Medical Diagnosis trapezius strain, neck  pain   Referring Provider Lamar Blinks, MD   Onset Date/Surgical Date --  "years"   Hand Dominance Right   Next MD Visit 05/04/16   Prior Therapy 4-5 years ago     Precautions   Precautions None     Restrictions   Weight Bearing Restrictions No     Balance Screen   Has the patient fallen in the past 6 months No   Has the patient had a decrease in activity level because of a fear of falling?  No   Is the patient reluctant to leave their home because of a fear of falling?  No     Home Social worker Private residence   Living Arrangements Children;Spouse/significant other   Additional Comments increased symptoms (numbness and pain) with ADLs and household tasks     Prior Function   Level of Independence Independent   Vocation Full time employment   Vocation Requirements [place] in [place] spend time with children, beach, camping     Cognition   Overall Cognitive Status Within Functional Limits for tasks assessed     Observation/Other Assessments   Focus on Therapeutic Outcomes (FOTO)  59 (41% limited; predicted 34% limited)  Posture/Postural Control   Posture/Postural Control Postural limitations   Postural Limitations Rounded Shoulders;Forward head     AROM   Overall AROM Comments pain with flexion and rotation bil   AROM Assessment Site Cervical   Cervical Flexion 45   Cervical Extension 50   Cervical - Right Side Bend 41   Cervical - Left Side Bend 46   Cervical - Right Rotation 70   Cervical - Left Rotation 70     Strength   Strength Assessment Site Shoulder;Elbow;Hand   Right Shoulder Flexion 4/5   Right Shoulder ABduction 3+/5   Right Shoulder Internal Rotation 4/5   Right Shoulder External Rotation 3/5   Left Shoulder Flexion 4/5   Left Shoulder ABduction 3/5   Left Shoulder Internal Rotation 4/5   Left Shoulder External Rotation 3/5   Right/Left Elbow Right;Left   Right Elbow Flexion 5/5   Right Elbow Extension 5/5    Left Elbow Flexion 5/5   Left Elbow Extension 5/5   Right Hand Grip (lbs) 51.33  55, 45, 54   Left Hand Grip (lbs) 45  45, 45, 45     Palpation   Palpation comment significant tenderness along bil upper traps and levator scapulae, cervical paraspinals tender and tightness noted L > R     Special Tests    Special Tests Cervical   Cervical Tests Spurling's;Dictraction     Spurling's   Findings Negative     Distraction Test   Findngs Negative                   [facility] Adult PT Treatment/Exercise - 04/20/16 1408      Self-Care   Self-Care Other Self-Care Comments   Other Self-Care Comments  issued HEP that pt reports she was familiar with from previous PT sessions - UT, LS and cervical retraction                PT Education - 04/20/16 1444    Education provided Yes   Education Details HEP   Person(s) Educated Patient   Methods Explanation;Demonstration;Handout   Comprehension Verbalized understanding;Returned demonstration;Need further instruction             PT Long Term Goals - 04/20/16 1618      PT LONG TERM GOAL #1   Title independent with HEP (06/01/16)   Time 6   Period Weeks   Status New     PT LONG TERM GOAL #2   Title verbalize understanding of posture and body mechanics to decrease risk of reinjury (06/01/16)   Time 6   Period Weeks   Status New     PT LONG TERM GOAL #3   Title perform cervical ROM without pain for improved function (06/01/16)   Time 6   Period Weeks   Status New     PT LONG TERM GOAL #4   Title improve bil shoulder strength to at least 4/5 for improved function (06/01/16)   Time 6   Period Weeks   Status New               Plan - 04/20/16 1457    Clinical Impression Statement Pt is a 40 y/o female who presents to OPPT for moderate complexity PT eval for neck pain and upper trapezius strain and tightness.  Pt reports intermittent numbness which occurs mostly with specific activities (rounded  shoulders and forward head) or sleeping.  Pt will benefit from PT to address deficits listed.   Rehab Potential  Good   PT Frequency 2x / week   PT Duration 6 weeks   PT Treatment/Interventions ADLs/Self Care Home Management;Cryotherapy;Electrical Stimulation;Iontophoresis 4mg /ml Dexamethasone;Moist Heat;Traction;Ultrasound;Therapeutic exercise;Therapeutic activities;Functional mobility training;Patient/family education;Manual techniques;Passive range of motion;Taping;Dry needling   PT Next Visit Plan review HEP, manual, ?traction (doubt pt will tolerate at this time), modalities and TDN PRN  (may need to perform TOS testing to rule out)   Consulted and Agree with Plan of Care Patient      Patient will benefit from skilled therapeutic intervention in order to improve the following deficits and impairments:  Postural dysfunction, Decreased range of motion, Increased muscle spasms, Pain, Decreased strength  Visit Diagnosis: Cervicalgia - Plan: PT plan of care cert/re-cert  Abnormal posture - Plan: PT plan of care cert/re-cert  Muscle weakness (generalized) - Plan: PT plan of care cert/re-cert     Problem List Patient Active Problem List   Diagnosis Date Noted  . Postpartum care following vaginal delivery (11/4) 06/15/2011        Laureen Abrahams, PT, DPT 04/20/16 4:22 PM     [facility] [address]  [place], [place], [address] Phone: [phone]   Fax:  [phone]  Name: Tricia [name] MRN: [id number] Date of Birth: 17-Aug-1975

## 2016-04-20 NOTE — Patient Instructions (Addendum)
    Flexibility: Upper Trapezius Stretch    Gently grasp right side of head while reaching behind back with other hand. Tilt head away until a gentle stretch is felt. Hold __20-30__ seconds.  Repeat to other side. Repeat __3__ times per set. Do __1__ sets per session. Do __2-3__ sessions per day.  http://orth.exer.us/341   Copyright  VHI. All rights reserved.     Levator Scapula Stretch, Sitting    Sit, one hand tucked under hip on side to be stretched, other hand over top of head. Turn head toward other side and look down. Use hand on head to gently stretch neck in that position. Hold _20-30__ seconds.  Repeat to other side. Repeat _3__ times per session. Do _2-3__ sessions per day.  Copyright  VHI. All rights reserved.       Flexibility: Neck Retraction    Pull head straight back, keeping eyes and jaw level. Repeat __10__ times per set. Do __1__ sets per session. Do __2-3__ sessions per day.  http://orth.exer.us/345   Copyright  VHI. All rights reserved.

## 2016-04-23 ENCOUNTER — Ambulatory Visit: Payer: 59 | Admitting: Physical Therapy

## 2016-04-23 DIAGNOSIS — M542 Cervicalgia: Secondary | ICD-10-CM

## 2016-04-23 DIAGNOSIS — M6281 Muscle weakness (generalized): Secondary | ICD-10-CM

## 2016-04-23 DIAGNOSIS — R293 Abnormal posture: Secondary | ICD-10-CM | POA: Diagnosis not present

## 2016-04-23 NOTE — Therapy (Signed)
[facility] [address]  [place], [place], [address] Phone: [phone]   Fax:  [phone]  Physical Therapy Treatment  Patient Details  Name: Tricia [name] MRN: [id number] Date of Birth: 1976/07/13 Referring Provider: Lamar Blinks, MD  Encounter Date: 04/23/2016      PT End of Session - 04/23/16 1355    Visit Number 2   Number of Visits 12   Date for PT Re-Evaluation 06/01/16   PT Start Time [id number]   PT Stop Time 1355   PT Time Calculation (min) 40 min   Activity Tolerance Patient tolerated treatment well   Behavior During Therapy [facility] for tasks assessed/performed      Past Medical History:  Diagnosis Date  . Allergic rhinitis   . History of chicken pox   . History of shingles   . Hyperlipidemia     Past Surgical History:  Procedure Laterality Date  . CHOLECYSTECTOMY    . HERNIA REPAIR    . wisdome teeth      There were no vitals filed for this visit.      Subjective Assessment - 04/23/16 1317    Subjective exercises going well; feels the same today   Pertinent History moderate carpal tunnel   Limitations Sitting;House hold activities   Diagnostic tests nothing recent   Patient Stated Goals improve tightness, decrease headaches   Currently in Pain? No/denies                         [facility] Adult PT Treatment/Exercise - 04/23/16 1318      Exercises   Exercises Neck     Neck Exercises: Machines for Strengthening   UBE (Upper Arm Bike) L1.5 x 4 min (2' fwd / 2' bwd)     Manual Therapy   Manual Therapy Myofascial release   Myofascial Release suboccipital release, TPR and myofacial release of bil UT and cervical paraspinals, L>R     Neck Exercises: Stretches   Upper Trapezius Stretch 2 reps;30 seconds   Upper Trapezius Stretch Limitations bil   Levator Stretch 2 reps;30 seconds   Levator Stretch Limitations bil   Chest Stretch 2 reps;30 seconds   Chest Stretch  Limitations 40 deg abdct; 90 deg abdct                      PT Long Term Goals - 04/20/16 1618      PT LONG TERM GOAL #1   Title independent with HEP (06/01/16)   Time 6   Period Weeks   Status New     PT LONG TERM GOAL #2   Title verbalize understanding of posture and body mechanics to decrease risk of reinjury (06/01/16)   Time 6   Period Weeks   Status New     PT LONG TERM GOAL #3   Title perform cervical ROM without pain for improved function (06/01/16)   Time 6   Period Weeks   Status New     PT LONG TERM GOAL #4   Title improve bil shoulder strength to at least 4/5 for improved function (06/01/16)   Time 6   Period Weeks   Status New               Plan - 04/23/16 1355    Clinical Impression Statement Pt tolerated session well today and reported decreased stiffness after manual.  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  Pt will continue to benefit from PT to maximize function.   PT Treatment/Interventions ADLs/Self Care Home Management;Cryotherapy;Electrical Stimulation;Iontophoresis 4mg /ml Dexamethasone;Moist Heat;Traction;Ultrasound;Therapeutic exercise;Therapeutic activities;Functional mobility training;Patient/family education;Manual techniques;Passive range of motion;Taping;Dry needling   PT Next Visit Plan posture exercises, manual, ?traction (doubt pt will tolerate at this time), modalities and TDN PRN  (may need to perform TOS testing to rule out)   Consulted and Agree with Plan of Care Patient      Patient will benefit from skilled therapeutic intervention in order to improve the following deficits and impairments:  Postural dysfunction, Decreased range of motion, Increased muscle spasms, Pain, Decreased strength  Visit Diagnosis: Cervicalgia  Abnormal posture  Muscle weakness (generalized)     Problem List Patient Active  Problem List   Diagnosis Date Noted  . Postpartum care following vaginal delivery (11/4) 06/15/2011       Laureen Abrahams, PT, DPT 04/23/16 1:57 PM    [facility] [address]  [name], [place], [address] Phone: [phone]   Fax:  [phone]  Name: SARAH LEGNER MRN: [id number] Date of Birth: [birth year]

## 2016-04-27 ENCOUNTER — Encounter: Payer: 59 | Admitting: Rehabilitative and Restorative Service Providers"

## 2016-05-04 ENCOUNTER — Ambulatory Visit: Payer: 59 | Admitting: Physical Therapy

## 2016-05-04 ENCOUNTER — Ambulatory Visit (HOSPITAL_BASED_OUTPATIENT_CLINIC_OR_DEPARTMENT_OTHER)
Admission: RE | Admit: 2016-05-04 | Discharge: 2016-05-04 | Disposition: A | Discharge status: Home / Self Care | Payer: 59 | Source: Ambulatory Visit | Attending: Family Medicine | Admitting: Family Medicine

## 2016-05-04 ENCOUNTER — Encounter: Payer: Self-pay | Admitting: Family Medicine

## 2016-05-04 ENCOUNTER — Ambulatory Visit (INDEPENDENT_AMBULATORY_CARE_PROVIDER_SITE_OTHER): Payer: 59 | Admitting: Family Medicine

## 2016-05-04 VITALS — BP 110/80 | HR 84 | Temp 98.7°F | Ht 60.75 in | Wt 184.8 lb

## 2016-05-04 DIAGNOSIS — J3489 Other specified disorders of nose and nasal sinuses: Secondary | ICD-10-CM | POA: Diagnosis not present

## 2016-05-04 DIAGNOSIS — E785 Hyperlipidemia, unspecified: Secondary | ICD-10-CM

## 2016-05-04 DIAGNOSIS — R51 Headache: Secondary | ICD-10-CM

## 2016-05-04 DIAGNOSIS — J329 Chronic sinusitis, unspecified: Secondary | ICD-10-CM | POA: Diagnosis not present

## 2016-05-04 DIAGNOSIS — R293 Abnormal posture: Secondary | ICD-10-CM

## 2016-05-04 DIAGNOSIS — M542 Cervicalgia: Secondary | ICD-10-CM

## 2016-05-04 DIAGNOSIS — R519 Headache, unspecified: Secondary | ICD-10-CM

## 2016-05-04 DIAGNOSIS — M6281 Muscle weakness (generalized): Secondary | ICD-10-CM

## 2016-05-04 MED ORDER — SUMATRIPTAN SUCCINATE 100 MG PO TABS: ORAL_TABLET | ORAL | 0 refills | Status: DC

## 2016-05-04 NOTE — Progress Notes (Signed)
Pre visit review using our clinic review tool, if applicable. No additional management support is needed unless otherwise documented below in the visit note. 

## 2016-05-04 NOTE — Patient Instructions (Signed)
It was very good to see you again today!  Please have an x-ray of your sinuses today on the ground floor- then you can head home.  If there is not any sign of infection we will have you try imitrex for headache.  Take it at the first sign of headache; this may help to stop the headache from developing Please keep me posted as to your progress!

## 2016-05-04 NOTE — Therapy (Signed)
[facility] [address]  [place], [place], [address] Phone: [phone]   Fax:  [phone]  Physical Therapy Treatment  Patient Details  Name: Tricia [name] MRN: [id number] Date of Birth: March 29, 1976 Referring Provider: Lamar Blinks, MD  Encounter Date: 05/04/2016      PT End of Session - 05/04/16 1531    Visit Number 3   Number of Visits 12   Date for PT Re-Evaluation 06/01/16   PT Start Time 1450   PT Stop Time 1538   PT Time Calculation (min) 48 min   Activity Tolerance Patient tolerated treatment well   Behavior During Therapy [facility] for tasks assessed/performed      Past Medical History:  Diagnosis Date  . Allergic rhinitis   . History of chicken pox   . History of shingles   . Hyperlipidemia     Past Surgical History:  Procedure Laterality Date  . CHOLECYSTECTOMY    . HERNIA REPAIR    . wisdome teeth      There were no vitals filed for this visit.      Subjective Assessment - 05/04/16 1451    Subjective doing "ok"  R side feels tighter than L side   Pertinent History moderate carpal tunnel   Limitations Sitting;House hold activities   Patient Stated Goals improve tightness, decrease headaches   Currently in Pain? No/denies                         [facility] Adult PT Treatment/Exercise - 05/04/16 1452      Neck Exercises: Machines for Strengthening   UBE (Upper Arm Bike) L2.5 x 6 min (3' fwd / 3' bwd)     Neck Exercises: Supine   Neck Retraction 10 reps;5 secs   Neck Retraction Limitations on 1/2 foam roll   Shoulder ABduction 10 reps;Both   Shoulder Abduction Limitations red theraband on 1/2 foam roll   Other Supine Exercise er on 1/2 foam roll; red theraband x 10 reps     Modalities   Modalities Electrical Stimulation;Moist Heat     Moist Heat Therapy   Number Minutes Moist Heat 15 Minutes   Moist Heat Location Cervical     Electrical Stimulation   Electrical Stimulation Location R c-spine to upper trap   Electrical Stimulation Action pre mod   Electrical Stimulation Parameters to tolerance x 15 min   Electrical Stimulation Goals Pain;Tone     Manual Therapy   Manual Therapy Joint mobilization   Joint Mobilization C 4-6 R UPA and R/L lateral mobs   Myofascial Release suboccipital release, TPR and myofacial release of bil UT and cervical paraspinals, R side     Neck Exercises: Stretches   Other Neck Stretches supine on 1/2 foam roll x 3 min                     PT Long Term Goals - 04/20/16 1618      PT LONG TERM GOAL #1   Title independent with HEP (06/01/16)   Time 6   Period Weeks   Status New     PT LONG TERM GOAL #2   Title verbalize understanding of posture and body mechanics to decrease risk of reinjury (06/01/16)   Time 6   Period Weeks   Status New     PT LONG TERM GOAL #3   Title perform cervical ROM without pain for improved function (  06/01/16)   Time 6   Period Weeks   Status New     PT LONG TERM GOAL #4   Title improve bil shoulder strength to at least 4/5 for improved function (06/01/16)   Time 6   Period Weeks   Status New               Plan - 05/04/16 1531    Clinical Impression Statement Pt with increased R sided neck and shoulder pain today with increased tightness felt.  Trialed estim to see if benefit noted.  Tolerated general strengthening exercises well without increase in pain.   PT Treatment/Interventions ADLs/Self Care Home Management;Cryotherapy;Electrical Stimulation;Iontophoresis 4mg /ml Dexamethasone;Moist Heat;Traction;Ultrasound;Therapeutic exercise;Therapeutic activities;Functional mobility training;Patient/family education;Manual techniques;Passive range of motion;Taping;Dry needling   PT Next Visit Plan posture exercises, manual, ?traction (doubt pt will tolerate at this time), modalities and TDN PRN  (may need to perform TOS testing to rule out)   Consulted and  Agree with Plan of Care Patient      Patient will benefit from skilled therapeutic intervention in order to improve the following deficits and impairments:  Postural dysfunction, Decreased range of motion, Increased muscle spasms, Pain, Decreased strength  Visit Diagnosis: Cervicalgia  Abnormal posture  Muscle weakness (generalized)     Problem List Patient Active Problem List   Diagnosis Date Noted  . Postpartum care following vaginal delivery (11/4) 06/15/2011       Tricia [name], PT, DPT 05/04/16 3:39 PM    [facility] [address]  [place], [place], [address] Phone: [phone]   Fax:  [phone]  Name: Tricia [name] MRN: [id number] Date of Birth: [birth year]

## 2016-05-04 NOTE — Progress Notes (Signed)
[facility] [address], [place], [place] [address] [phone]  Date:  05/04/2016   Name:  Tricia [name]   DOB:  Dec 20, 1975   MRN:  [id number]  PCP:  Lamar Blinks, MD    Chief Complaint: Establish Care (Pt was last seen 03/30/16. Has started physical therapy. HA are about the same. )   History of Present Illness:  Tricia [name] is a 40 y.o. very pleasant female patient who presents with the following:  She was seen here about 1 month ago to establish care- HPI as below:  Tricia [name] is a 41 y.o. very pleasant female patient who presents with the following:  Here today as a new patient.  She is generally in good health- she is establishing with primary care.   She is here today with her 3 children- they are 64, 1 and 34 yo.    She has a history of scoliosis; dx around age 79 yo.  She never needed surgery for this Over the last 5 years or so she has had shoulder trouble.  Did some PT for a while prior to the birth of her youngest, but then could not continue due to insurance changes She has noted more frequent HA over the last 3-6 months.   She may wake up with a HA that might last for 2-3 days. She may use excedrin or another OTC medication. She has tried fioricet as well but it does not help that much.   The HA are generally in the back of her head.   No nausea or vomiting. She may have light sensitivity at times No aura.    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   She may have a HA once every 1-2 weeks here recently  She started some PT again last month.  She does feel like this is helping her some but she still does have some headaches and pain in her shoulders and neck.  She also has flexeril to use as needed- this helped but really but her to sleep so she cannot take it except at night.  She had had a few HA since our last visit.  Right now her head feels ok.  She has never been dx with  migraine or tried a triptan med She is not sure if her HA are related to her back/ neck or something else.   Never had any CVA or heart trouble She has an IUD  Her triglycerides were elevated on her last labs but she was not fasting. We plan to have her work on lifestyle and recheck fasting labs in 4-6 months Patient Active Problem List   Diagnosis Date Noted  . Postpartum care following vaginal delivery (11/4) 06/15/2011    Past Medical History:  Diagnosis Date  . Allergic rhinitis   . History of chicken pox   . History of shingles   . Hyperlipidemia     Past Surgical History:  Procedure Laterality Date  . CHOLECYSTECTOMY    . HERNIA REPAIR    . wisdome teeth      Social History  Substance Use Topics  . Smoking status: Never Smoker  . Smokeless tobacco: Never Used  . Alcohol use No    Family History  Problem Relation Age of Onset  . Hypertension Maternal Grandmother   . Hypertension Maternal Grandfather   . Hypertension Paternal Grandmother   . Heart disease  Paternal Grandmother   . Hypertension Paternal Grandfather   . Heart disease Paternal Grandfather   . Anesthesia problems Neg Hx   . Hypotension Neg Hx   . Pseudochol deficiency Neg Hx   . Malignant hyperthermia Neg Hx     Allergies  Allergen Reactions  . Sulfa Antibiotics     Childhood reaction    Medication list has been reviewed and updated.  Current Outpatient Prescriptions on File Prior to Visit  Medication Sig Dispense Refill  . cyclobenzaprine (FLEXERIL) 10 MG tablet Take 1/2 or 1 at bedtime as needed for muscle spasm 30 tablet 0  . ibuprofen (ADVIL,MOTRIN) 200 MG tablet Take 200 mg by mouth every 6 (six) hours as needed.    . Multiple Vitamin (MULTIVITAMIN) tablet Take 1 tablet by mouth daily.    . naproxen sodium (ALEVE) 220 MG tablet Take 220 mg by mouth as needed.     No current facility-administered medications on file prior to visit.     Review of Systems:  As per HPI- otherwise  negative.   Physical Examination: Vitals:   05/04/16 1610  BP: 110/80  Pulse: 84  Temp: 98.7 F (37.1 C)   Vitals:   05/04/16 1610  Weight: 184 lb 12.8 oz (83.8 kg)  Height: 5' 0.75" (1.543 m)   Body mass index is 35.21 kg/m. Ideal Body Weight: Weight in (lb) to have BMI = 25: 131  GEN: WDWN, NAD, Non-toxic, A & O x 3, obese, looks well HEENT: Atraumatic, Normocephalic. Neck supple. No masses, No LAD. Ears and Nose: No external deformity. CV: RRR, No M/G/R. No JVD. No thrill. No extra heart sounds. PULM: CTA B, no wheezes, crackles, rhonchi. No retractions. No resp. distress. No accessory muscle use. EXTR: No c/c/e NEURO Normal gait.  PSYCH: Normally interactive. Conversant. Not depressed or anxious appearing.  Calm demeanor.    Assessment and Plan: Persistent headaches - Plan: SUMAtriptan (IMITREX) 100 MG tablet  Sinus pressure - Plan: DG Sinuses Complete  Dyslipidemia  Here today to eval a couple of concerns.  She is doing PT for muscle pain in her neck and shoulders; however she is still having some headaches. She has noted sinus pressure and congestion for about one year- we will do plain films to see if there is any evidence of sinus infection.  Assuming this is negative will plan to try imitrex for her Discussed lifestyle changes to manage her triglycerides- plan to recheck fasting labs in a few months  See patient instructions for more details.     Signed Lamar Blinks, MD

## 2016-05-07 ENCOUNTER — Ambulatory Visit: Payer: 59 | Admitting: Rehabilitative and Restorative Service Providers"

## 2016-05-07 ENCOUNTER — Encounter: Payer: Self-pay | Admitting: Rehabilitative and Restorative Service Providers"

## 2016-05-07 DIAGNOSIS — M6281 Muscle weakness (generalized): Secondary | ICD-10-CM | POA: Diagnosis not present

## 2016-05-07 DIAGNOSIS — M542 Cervicalgia: Secondary | ICD-10-CM | POA: Diagnosis not present

## 2016-05-07 DIAGNOSIS — R293 Abnormal posture: Secondary | ICD-10-CM | POA: Diagnosis not present

## 2016-05-07 NOTE — Patient Instructions (Addendum)
Shoulder Blade Squeeze    Rotate shoulders back, then squeeze shoulder blades down and back Hold 10 reps Repeat __10__ times. Do _several___ sessions per day.   Scapula Adduction With Pectoralis Stretch: Low - Standing   Shoulders at 45 hands even with shoulders, keeping weight through legs, shift weight forward until you feel pull or stretch through the front of your chest. Hold _30__ seconds. Do _3__ times, _2-4__ times per day.   Scapula Adduction With Pectoralis Stretch: Mid-Range - Standing   Shoulders at 90 elbows even with shoulders, keeping weight through legs, shift weight forward until you feel pull or strength through the front of your chest. Hold __30_ seconds. Do _3__ times, __2-4_ times per day.   Scapula Adduction With Pectoralis Stretch: High - Standing   Shoulders at 120 hands up high on the doorway, keeping weight on feet, shift weight forward until you feel pull or stretch through the front of your chest. Hold _30__ seconds. Do _3__ times, _2-3__ times per day.    SUPINE Tips A    Being in the supine position means to be lying on the back. Lying on the back is the position of least compression on the bones and discs of the spine, and helps to re-align the natural curves of the back. Gradually bring arms up more toward your ears. Hold 2-5 min can bend elbows to relax stretch as needed.  Can lie with swim noodle along spine to increase the stretch.  Also may rock knees to side to stretch along the trunk    TENS UNIT: This is helpful for muscle pain and spasm.   Search and Purchase a TENS 7000 2nd edition at www.tenspros.com. It should be less than $30.     TENS unit instructions: Do not shower or bathe with the unit on Turn the unit off before removing electrodes or batteries If the electrodes lose stickiness add a drop of water to the electrodes after they are disconnected from the unit and place on plastic sheet. If you continued to have  difficulty, call the TENS unit company to purchase more electrodes. Do not apply lotion on the skin area prior to use. Make sure the skin is clean and dry as this will help prolong the life of the electrodes. After use, always check skin for unusual red areas, rash or other skin difficulties. If there are any skin problems, does not apply electrodes to the same area. Never remove the electrodes from the unit by pulling the wires. Do not use the TENS unit or electrodes other than as directed. Do not change electrode placement without consultating your therapist or physician. Keep 2 fingers with between each electrode.

## 2016-05-07 NOTE — Therapy (Signed)
[facility] [address]  [place], [place], [address] Phone: [phone]   Fax:  [phone]  Physical Therapy Treatment  Patient Details  Name: Tricia [name] MRN: [id number] Date of Birth: Apr 25, 1976 Referring Provider: Lamar Blinks, MD  Encounter Date: 05/07/2016      PT End of Session - 05/07/16 0711    Visit Number 4   Number of Visits 12   Date for PT Re-Evaluation 06/01/16   PT Start Time 0709   PT Stop Time 0809   PT Time Calculation (min) 60 min   Activity Tolerance Patient tolerated treatment well      Past Medical History:  Diagnosis Date  . Allergic rhinitis   . History of chicken pox   . History of shingles   . Hyperlipidemia     Past Surgical History:  Procedure Laterality Date  . CHOLECYSTECTOMY    . HERNIA REPAIR    . wisdome teeth      There were no vitals filed for this visit.      Subjective Assessment - 05/07/16 0712    Subjective Some increased pain Tuesday night. Some discomfort this am - mostly in the Rt side. Thinking about her work station and how to modify.    Currently in Pain? Yes   Pain Score 3    Pain Location Neck   Pain Orientation Right;Upper   Pain Descriptors / Indicators Dull;Aching;Burning   Pain Type Chronic pain   Pain Onset More than a month ago   Pain Frequency Intermittent                         [facility] Adult PT Treatment/Exercise - 05/07/16 0001      Neck Exercises: Standing   Neck Retraction 5 reps;10 secs   Other Standing Exercises scap squeeze with noodle 10 sec x 10     Neck Exercises: Supine   Neck Retraction 10 reps;10 secs     Moist Heat Therapy   Number Minutes Moist Heat 20 Minutes   Moist Heat Location Cervical  Rt pec/ant shoulder     Electrical Stimulation   Electrical Stimulation Location ; Rt pec    Electrical Stimulation Action IFC   Electrical Stimulation Parameters  to tolerance   Electrical  Stimulation Goals Pain;Tone     Manual Therapy   Manual therapy comments pt supine head on pillow    Joint Mobilization CPA through cervical spine    Soft tissue mobilization ant/lat/post cervical musculature; upper traps/leveator bilat - Rt pecs/ant shoulder    Myofascial Release cervical posteriorly/chest anteriorly   Passive ROM cervical flexion; flexion with slight rotation; lateral flexion    Manual Traction through the manual work 20-30 sec x 3-4      Neck Exercises: Stretches   Other Neck Stretches supine on swim noodle arms at ~75 deg at sides for satis stretch ~3 min    Other Neck Stretches 3 way doorway stretch 30 sec x 2 each - pt noted tingling into bilat UE's with higher position - encouraged to continue with stretch as tolerated to work on releasing the tightness through pecs creating pressure at bracial plexus                PT Education - 05/07/16 0729    Education provided Yes   Education Details HEP   Person(s) Educated Patient   Methods Explanation;Demonstration;Tactile cues;Verbal cues;Handout   Comprehension Verbalized understanding;Returned demonstration;Verbal  cues required;Tactile cues required             PT Long Term Goals - 05/07/16 0714      PT LONG TERM GOAL #1   Title independent with HEP (06/01/16)   Time 6   Period Weeks   Status On-going     PT LONG TERM GOAL #2   Title verbalize understanding of posture and body mechanics to decrease risk of reinjury (06/01/16)   Time 6   Period Weeks   Status On-going     PT LONG TERM GOAL #3   Title perform cervical ROM without pain for improved function (06/01/16)   Time 6   Period Weeks   Status On-going     PT LONG TERM GOAL #4   Title improve bil shoulder strength to at least 4/5 for improved function (06/01/16)   Time 6   Period Weeks   Status On-going               Plan - 05/07/16 0802    Clinical Impression Statement Signifcant tightness ant/lat/post; pecs Rt > Lt.  Tightness through the anterior chest creating the UE symptoms. Responded well to manual work and passive stretch. Will consider TDN in subsequent visits.    Rehab Potential Good   PT Frequency 2x / week   PT Duration 6 weeks   PT Treatment/Interventions ADLs/Self Care Home Management;Cryotherapy;Electrical Stimulation;Iontophoresis 4mg /ml Dexamethasone;Moist Heat;Traction;Ultrasound;Therapeutic exercise;Therapeutic activities;Functional mobility training;Patient/family education;Manual techniques;Passive range of motion;Taping;Dry needling   PT Next Visit Plan posture exercises, manual, ?traction (doubt pt will tolerate at this time), modalities and TDN PRN  (may need to perform TOS testing to rule out)      Patient will benefit from skilled therapeutic intervention in order to improve the following deficits and impairments:  Postural dysfunction, Decreased range of motion, Increased muscle spasms, Pain, Decreased strength  Visit Diagnosis: Cervicalgia  Abnormal posture  Muscle weakness (generalized)     Problem List Patient Active Problem List   Diagnosis Date Noted  . Postpartum care following vaginal delivery (11/4) 06/15/2011    Wonda Goodgame Nilda Simmer PT, MPH  05/07/2016, 8:10 AM  [facility] [address]  [place], [place], [address] Phone: [phone]   Fax:  [phone]  Name: Tricia [name] MRN: [id number] Date of Birth: [birth year]

## 2016-05-11 ENCOUNTER — Ambulatory Visit: Payer: 59 | Attending: Family Medicine | Admitting: Physical Therapy

## 2016-05-11 DIAGNOSIS — M542 Cervicalgia: Secondary | ICD-10-CM | POA: Diagnosis not present

## 2016-05-11 DIAGNOSIS — M6281 Muscle weakness (generalized): Secondary | ICD-10-CM | POA: Diagnosis not present

## 2016-05-11 DIAGNOSIS — R293 Abnormal posture: Secondary | ICD-10-CM | POA: Insufficient documentation

## 2016-05-11 NOTE — Therapy (Signed)
[facility] [address]  [place], [place], [address] Phone: [phone]   Fax:  [phone]  Physical Therapy Treatment  Patient Details  Name: Tricia [name] MRN: [id number] Date of Birth: 09-30-1975 Referring Provider: Lamar Blinks, MD  Encounter Date: 05/11/2016      PT End of Session - 05/11/16 0920    Visit Number 5   Number of Visits 12   Date for PT Re-Evaluation 06/01/16   PT Start Time 0846   PT Stop Time 0934   PT Time Calculation (min) 48 min   Activity Tolerance Patient tolerated treatment well   Behavior During Therapy [facility] for tasks assessed/performed      Past Medical History:  Diagnosis Date  . Allergic rhinitis   . History of chicken pox   . History of shingles   . Hyperlipidemia     Past Surgical History:  Procedure Laterality Date  . CHOLECYSTECTOMY    . HERNIA REPAIR    . wisdome teeth      There were no vitals filed for this visit.      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     Pertinent History moderate carpal tunnel   Limitations Sitting;House hold activities   Diagnostic tests nothing recent   Patient Stated Goals improve tightness, decrease headaches   Currently in Pain? Yes   Pain Score 2   Friday was 8/10   Pain Location Shoulder   Pain Orientation Right;Upper   Pain Descriptors / Indicators Aching;Burning;Dull   Pain Type Chronic pain   Pain Onset More than a month ago   Pain Frequency Intermittent   Aggravating Factors  overhead and static holds   Pain Relieving Factors nothing                         [facility] Adult PT Treatment/Exercise - 05/11/16 0851      Neck Exercises: Machines for Strengthening   UBE (Upper Arm Bike) L3.0 x 6 min (3' fwd / 3' bwd)     Neck Exercises: Theraband   Scapula Retraction 15 reps;Green   Scapula  Retraction Limitations 5 sec hold   Shoulder Extension Green;15 reps   Shoulder Extension Limitations anchored overhead     Modalities   Modalities Traction     Traction   Type of Traction Cervical   Min (lbs) 10   Max (lbs) 18   Hold Time 60   Rest Time 20   Time 15     Neck Exercises: Stretches   Other Neck Stretches 3 way doorway stretch 30 sec x 2 each - pt noted tingling into bilat UE's with higher position - encouraged to continue with stretch as tolerated to work on releasing the tightness through pecs creating pressure at bracial plexus                     PT Long Term Goals - 05/07/16 0714      PT LONG TERM GOAL #1   Title independent with HEP (06/01/16)   Time 6   Period Weeks   Status On-going     PT LONG TERM GOAL #2   Title verbalize understanding of posture and body mechanics to decrease risk of reinjury (06/01/16)   Time 6   Period Weeks   Status On-going  PT LONG TERM GOAL #3   Title perform cervical ROM without pain for improved function (06/01/16)   Time 6   Period Weeks   Status On-going     PT LONG TERM GOAL #4   Title improve bil shoulder strength to at least 4/5 for improved function (06/01/16)   Time 6   Period Weeks   Status On-going               Plan - 05/11/16 0920    Clinical Impression Statement Pt tolerated exercises today and initiated traction today since pt reports relief with manual traction.  Will continue to benefit from PT to maximize function.   PT Treatment/Interventions ADLs/Self Care Home Management;Cryotherapy;Electrical Stimulation;Iontophoresis 4mg /ml Dexamethasone;Moist Heat;Traction;Ultrasound;Therapeutic exercise;Therapeutic activities;Functional mobility training;Patient/family education;Manual techniques;Passive range of motion;Taping;Dry needling   PT Next Visit Plan assess response to traction, posture exercises, manual, ?traction (doubt pt will tolerate at this time), modalities and TDN PRN   (may need to perform TOS testing to rule out)   Consulted and Agree with Plan of Care Patient      Patient will benefit from skilled therapeutic intervention in order to improve the following deficits and impairments:  Postural dysfunction, Decreased range of motion, Increased muscle spasms, Pain, Decreased strength  Visit Diagnosis: Cervicalgia  Abnormal posture  Muscle weakness (generalized)     Problem List Patient Active Problem List   Diagnosis Date Noted  . Postpartum care following vaginal delivery (11/4) 06/15/2011       Laureen Abrahams, PT, DPT 05/11/16 10:54 AM    [facility] [address]  [place], [place], [address] Phone: [phone]   Fax:  [phone]  Name: Tricia [name] MRN: [id number] Date of Birth: [birth year]

## 2016-05-14 ENCOUNTER — Encounter: Payer: Self-pay | Admitting: Rehabilitative and Restorative Service Providers"

## 2016-05-14 ENCOUNTER — Ambulatory Visit: Payer: 59 | Admitting: Rehabilitative and Restorative Service Providers"

## 2016-05-14 DIAGNOSIS — R293 Abnormal posture: Secondary | ICD-10-CM

## 2016-05-14 DIAGNOSIS — M542 Cervicalgia: Secondary | ICD-10-CM

## 2016-05-14 DIAGNOSIS — M6281 Muscle weakness (generalized): Secondary | ICD-10-CM

## 2016-05-14 NOTE — Patient Instructions (Signed)

## 2016-05-14 NOTE — Therapy (Signed)
[facility] [address]  [place], [place], [address] Phone: [phone]   Fax:  [phone]  Physical Therapy Treatment  Patient Details  Name: Tricia [name] MRN: [id number] Date of Birth: 08-Jul-1976 Referring Provider: Lamar Blinks, MD  Encounter Date: 05/14/2016      PT End of Session - 05/14/16 0812    Visit Number 6   Number of Visits 12   Date for PT Re-Evaluation 06/01/16   PT Start Time 0715   PT Stop Time 0805   PT Time Calculation (min) 50 min   Activity Tolerance Patient tolerated treatment well      Past Medical History:  Diagnosis Date  . Allergic rhinitis   . History of chicken pox   . History of shingles   . Hyperlipidemia     Past Surgical History:  Procedure Laterality Date  . CHOLECYSTECTOMY    . HERNIA REPAIR    . wisdome teeth      There were no vitals filed for this visit.      Subjective Assessment - 05/14/16 0815    Subjective some better today. can tell she is less tight and painful. ready to try the TDN    Currently in Pain? No/denies                         [facility] Adult PT Treatment/Exercise - 05/14/16 0001      Neck Exercises: Machines for Strengthening   UBE (Upper Arm Bike) L3.0 x 6 min (3' fwd / 3' bwd)     Neck Exercises: Theraband   Scapula Retraction 20 reps;Red   Shoulder Extension 20 reps;Red   Rows 20 reps;Red     Neck Exercises: Standing   Neck Retraction 5 reps;10 secs   Other Standing Exercises scap squeeze with noodle 10 sec x 10     Moist Heat Therapy   Number Minutes Moist Heat 15 Minutes   Moist Heat Location Cervical  thoracic     Electrical Stimulation   Electrical Stimulation Location Rt lateral cervical upper trap    Electrical Stimulation Action IFC   Electrical Stimulation Parameters to tolerance   Electrical Stimulation Goals Pain;Tone     Manual Therapy   Manual therapy comments pt prone   Soft tissue  mobilization ant/lat/post cervical musculature; upper traps/leveator bilat    Myofascial Release cervical posteriorly/chest anteriorly     Neck Exercises: Stretches   Other Neck Stretches 3 way doorway stretch 30 sec x 2 each - pt noted tingling into bilat UE's with higher position - encouraged to continue with stretch as tolerated to work on releasing the tightness through pecs creating pressure at bracial plexus          Trigger Point Dry Needling - 05/14/16 0818    Consent Given? Yes   Education Handout Provided Yes   Muscles Treated Upper Body Upper trapezius;Levator scapulae;Rhomboids;Longissimus   Upper Trapezius Response Palpable increased muscle length   Levator Scapulae Response Palpable increased muscle length   Rhomboids Response Palpable increased muscle length   Longissimus Response Palpable increased muscle length              PT Education - 05/14/16 0811    Education provided Yes   Education Details TDN   Person(s) Educated Patient   Methods Explanation   Comprehension Verbalized understanding             PT Long Term Goals -  05/14/16 0812      PT LONG TERM GOAL #1   Title independent with HEP (06/01/16)   Time 6   Period Weeks   Status On-going     PT LONG TERM GOAL #2   Title verbalize understanding of posture and body mechanics to decrease risk of reinjury (06/01/16)   Time 6   Period Weeks   Status On-going     PT LONG TERM GOAL #3   Title perform cervical ROM without pain for improved function (06/01/16)   Time 6   Period Weeks   Status On-going     PT LONG TERM GOAL #4   Title improve bil shoulder strength to at least 4/5 for improved function (06/01/16)   Time 6   Period Weeks   Status On-going               Plan - 05/14/16 [id number]    Clinical Impression Statement Progressing well with rehab. Good decrease in tissue tightness with TDN.    Rehab Potential Good   PT Frequency 2x / week   PT Treatment/Interventions  ADLs/Self Care Home Management;Cryotherapy;Electrical Stimulation;Iontophoresis 4mg /ml Dexamethasone;Moist Heat;Traction;Ultrasound;Therapeutic exercise;Therapeutic activities;Functional mobility training;Patient/family education;Manual techniques;Passive range of motion;Taping;Dry needling   PT Next Visit Plan continue traction as indicated, posture exercises, manual, modalities and assess response to TDN  (may need to perform TOS testing to rule out)   Consulted and Agree with Plan of Care Patient      Patient will benefit from skilled therapeutic intervention in order to improve the following deficits and impairments:  Postural dysfunction, Decreased range of motion, Increased muscle spasms, Pain, Decreased strength  Visit Diagnosis: Cervicalgia  Abnormal posture  Muscle weakness (generalized)     Problem List Patient Active Problem List   Diagnosis Date Noted  . Postpartum care following vaginal delivery (11/4) 06/15/2011    Taseen Marasigan Nilda Simmer PT, MPH  05/14/2016, 9:22 AM  [facility] [address]  [place], [place], [address] Phone: [phone]   Fax:  [phone]  Name: Tricia [name] MRN: [id number] Date of Birth: Mar 13, 1976

## 2016-05-20 ENCOUNTER — Ambulatory Visit: Payer: 59

## 2016-05-25 ENCOUNTER — Ambulatory Visit: Payer: 59 | Admitting: Physical Therapy

## 2016-05-25 DIAGNOSIS — R293 Abnormal posture: Secondary | ICD-10-CM

## 2016-05-25 DIAGNOSIS — M542 Cervicalgia: Secondary | ICD-10-CM

## 2016-05-25 DIAGNOSIS — M6281 Muscle weakness (generalized): Secondary | ICD-10-CM

## 2016-05-25 NOTE — Therapy (Addendum)
[facility] [address]  [place], [place], [address] Phone: [phone]   Fax:  [phone]  Physical Therapy Treatment  Patient Details  Name: Tricia [name] MRN: [id number] Date of Birth: 1976/01/23 Referring Provider: Lamar Blinks, MD  Encounter Date: 05/25/2016      PT End of Session - 05/25/16 0854    Visit Number 7   Number of Visits 12   Date for PT Re-Evaluation 06/01/16   PT Start Time 0757   PT Stop Time 0855   PT Time Calculation (min) 58 min   Activity Tolerance Patient tolerated treatment well   Behavior During Therapy [facility] for tasks assessed/performed      Past Medical History:  Diagnosis Date  . Allergic rhinitis   . History of chicken pox   . History of shingles   . Hyperlipidemia     Past Surgical History:  Procedure Laterality Date  . CHOLECYSTECTOMY    . HERNIA REPAIR    . wisdome teeth      There were no vitals filed for this visit.      Subjective Assessment - 05/25/16 0757    Subjective feels "ok."  TDN was "interesting."  sore at work, but did pretty well after.     Pertinent History moderate carpal tunnel   Limitations Sitting;House hold activities   Diagnostic tests nothing recent   Patient Stated Goals improve tightness, decrease headaches   Currently in Pain? No/denies                         [facility] Adult PT Treatment/Exercise - 05/25/16 0800      Neck Exercises: Machines for Strengthening   UBE (Upper Arm Bike) L3.0 x 6 min (3' fwd / 3' bwd)     Neck Exercises: Theraband   Scapula Retraction Green;10 reps   Scapula Retraction Limitations 10 sec hold   Shoulder Extension 10 reps;Green   Shoulder Extension Limitations 5 sec hold   Shoulder External Rotation 10 reps;Green   Shoulder External Rotation Limitations bil     Neck Exercises: Seated   Neck Retraction 10 reps;5 secs     Moist Heat Therapy   Number Minutes Moist Heat 15 Minutes    Moist Heat Location Cervical     Electrical Stimulation   Electrical Stimulation Location neck/upper trap bil   Electrical Stimulation Action IFC   Electrical Stimulation Parameters to tolerance   Electrical Stimulation Goals Pain;Tone     Neck Exercises: Stretches   Upper Trapezius Stretch 2 reps;30 seconds   Upper Trapezius Stretch Limitations bil   Levator Stretch 2 reps;30 seconds   Levator Stretch Limitations bil   Other Neck Stretches 3 way doorway stretch 30 sec x 2 each - pt noted tingling into bilat UE's with higher position - encouraged to continue with stretch as tolerated to work on releasing the tightness through pecs creating pressure at bracial plexus                     PT Long Term Goals - 05/25/16 0855      PT LONG TERM GOAL #1   Title independent with HEP (06/01/16)   Status Achieved     PT LONG TERM GOAL #2   Title verbalize understanding of posture and body mechanics to decrease risk of reinjury (06/01/16)   Status On-going     PT LONG TERM GOAL #3   Title perform  cervical ROM without pain for improved function (06/01/16)   Status On-going     PT LONG TERM GOAL #4   Title improve bil shoulder strength to at least 4/5 for improved function (06/01/16)   Status On-going               Plan - 05/25/16 0855    Clinical Impression Statement Pt independent with HEP and has met LTG #1.  Anticipate holding PT x 30 days after next session as pt doing well and feels comfortable progressing to HEP.   PT Treatment/Interventions ADLs/Self Care Home Management;Cryotherapy;Electrical Stimulation;Iontophoresis 60m/ml Dexamethasone;Moist Heat;Traction;Ultrasound;Therapeutic exercise;Therapeutic activities;Functional mobility training;Patient/family education;Manual techniques;Passive range of motion;Taping;Dry needling   PT Next Visit Plan check remaning goals, anticipate hold x 30 days after next session   Consulted and Agree with Plan of Care  Patient      Patient will benefit from skilled therapeutic intervention in order to improve the following deficits and impairments:  Postural dysfunction, Decreased range of motion, Increased muscle spasms, Pain, Decreased strength  Visit Diagnosis: Cervicalgia  Abnormal posture  Muscle weakness (generalized)     Problem List Patient Active Problem List   Diagnosis Date Noted  . Postpartum care following vaginal delivery (11/4) 06/15/2011       SLaureen Abrahams PT, DPT 05/25/16 8:57 AM    [facility] [place] [address]Phone: [phone]  Fax:  [phone] Name: SQUINCI GAVIDIAMRN: [id number] of Birth: [birth year]     PHYSICAL THERAPY DISCHARGE SUMMARY  Visits from Start of Care: 7  Current functional level related to goals / functional outcomes: See above; pt did not return for final visit to assess remaining goals but anticipate all goals would have been met.   Remaining deficits: Unknown but anticipation was to hold or d/c PT at next visit so feel pt without remaining deficits   Education / Equipment: HEP  Plan: Patient agrees to discharge.  Patient goals were partially met. Patient is being discharged due to not returning since the last visit.  ?????    SLaureen Abrahams PT, DPT 06/26/16 10:01 AM  [facility] [id number] [phone](office) [phone](fax)

## 2016-05-28 ENCOUNTER — Ambulatory Visit: Payer: 59 | Admitting: Rehabilitative and Restorative Service Providers"

## 2016-08-18 ENCOUNTER — Ambulatory Visit (INDEPENDENT_AMBULATORY_CARE_PROVIDER_SITE_OTHER): Payer: 59 | Admitting: Physician Assistant

## 2016-08-18 ENCOUNTER — Encounter: Payer: Self-pay | Admitting: Physician Assistant

## 2016-08-18 VITALS — BP 118/81 | HR 141 | Temp 102.3°F | Ht 61.0 in | Wt 183.0 lb

## 2016-08-18 DIAGNOSIS — J039 Acute tonsillitis, unspecified: Secondary | ICD-10-CM

## 2016-08-18 DIAGNOSIS — R6889 Other general symptoms and signs: Secondary | ICD-10-CM

## 2016-08-18 LAB — POCT RAPID STREP A (OFFICE): RAPID STREP A SCREEN: NEGATIVE

## 2016-08-18 MED ORDER — AMOXICILLIN 500 MG PO TABS: 500.0000 mg | ORAL_TABLET | Freq: Two times a day (BID) | ORAL | 0 refills | Status: DC

## 2016-08-18 MED ORDER — OSELTAMIVIR PHOSPHATE 75 MG PO CAPS: 75.0000 mg | ORAL_CAPSULE | Freq: Two times a day (BID) | ORAL | 0 refills | Status: DC

## 2016-08-18 NOTE — Patient Instructions (Signed)
It was great meeting you today!  Start the Tamiflu and the antibiotic.  I would like for you to follow up in 3 days to check your tonsils. Please follow-up sooner if you have any more pressing concerns. Please go to the ER if you have any difficulty breathing, unable to take food or liquid, shortness of breath or rapid worsening symptoms.

## 2016-08-18 NOTE — Progress Notes (Signed)
Subjective:    Patient ID: Tricia [name], female    DOB: 12-18-1975, 41 y.o.   MRN: [id number]  HPI  Tricia [name] is a 41 y/o female who presents with a chief complaint of sore throat. She reports headaches, body aches, chills, fever and sore throat x 3 days. She works at an [employer]. She states that she has had contact with patients with known strep and flu. Received a flu shot this year. She has had a fever up to 102.3. She is eating and drinking fair. She denies any shortness of breath, drooling, inability to drink or eat. She has a history of repeat strep infections.   She is taking Alka Seltzer Cold and Flu medicine with minimal relief.   Review of Systems  See HPI  Past Medical History:  Diagnosis Date  . Allergic rhinitis   . History of chicken pox   . History of shingles   . Hyperlipidemia      Social History   Social History  . Marital status: Married    Spouse name: N/A  . Number of children: N/A  . Years of education: N/A   Occupational History  . Not on file.   Social History Main Topics  . Smoking status: Never Smoker  . Smokeless tobacco: Never Used  . Alcohol use No  . Drug use: No  . Sexual activity: Yes   Other Topics Concern  . Not on file   Social History Narrative  . No narrative on file    Past Surgical History:  Procedure Laterality Date  . CHOLECYSTECTOMY    . HERNIA REPAIR    . wisdome teeth      Family History  Problem Relation Age of Onset  . Hypertension Maternal Grandmother   . Hypertension Maternal Grandfather   . Hypertension Paternal Grandmother   . Heart disease Paternal Grandmother   . Hypertension Paternal Grandfather   . Heart disease Paternal Grandfather   . Anesthesia problems Neg Hx   . Hypotension Neg Hx   . Pseudochol deficiency Neg Hx   . Malignant hyperthermia Neg Hx     Allergies  Allergen Reactions  . Sulfa Antibiotics     Childhood reaction    Current Outpatient Prescriptions on File Prior  to Visit  Medication Sig Dispense Refill  . cyclobenzaprine (FLEXERIL) 10 MG tablet Take 1/2 or 1 at bedtime as needed for muscle spasm 30 tablet 0  . ibuprofen (ADVIL,MOTRIN) 200 MG tablet Take 200 mg by mouth every 6 (six) hours as needed.    . Multiple Vitamin (MULTIVITAMIN) tablet Take 1 tablet by mouth daily.    . naproxen sodium (ALEVE) 220 MG tablet Take 220 mg by mouth as needed.    . SUMAtriptan (IMITREX) 100 MG tablet Take 1/2 or 1 at first sign of migraine headache. May repeat in 2 hours if needed.  Max 200 in 24 hours 5 tablet 0   No current facility-administered medications on file prior to visit.     BP 118/81 (BP Location: Right Arm, Patient Position: Sitting, Cuff Size: Large)   Pulse (!) 141   Temp (!) 102.3 F (39.1 C) (Oral)   Ht 5\' 1"  (1.549 m)   Wt 183 lb (83 kg)   SpO2 97%   BMI 34.58 kg/m      Objective:   Physical Exam  Constitutional: She appears well-developed and well-nourished.  HENT:  Head: Normocephalic and atraumatic.  Right Ear: Tympanic membrane, external ear and ear  canal normal. Tympanic membrane is not erythematous, not retracted and not bulging.  Left Ear: Tympanic membrane, external ear and ear canal normal. Tympanic membrane is not erythematous, not retracted and not bulging.  Mouth/Throat: Uvula is midline. No trismus in the jaw. No uvula swelling. Posterior oropharyngeal edema and posterior oropharyngeal erythema present. No oropharyngeal exudate.  R tonsil 3+, L tonsil 2+ No exudates  Eyes: Conjunctivae are normal.  Cardiovascular: Regular rhythm and normal heart sounds.  Tachycardia present.   Pulmonary/Chest: Effort normal and breath sounds normal. No accessory muscle usage. No respiratory distress.  Lymphadenopathy:    She has cervical adenopathy.  Neurological: She is alert.  Skin: Skin is warm and intact.  Nursing note and vitals reviewed.      Assessment & Plan:  1. Flu-like symptoms High suspicion for flu based on symptoms,  treat with Tamiflu. Unable to perform rapid test, presently out of flu swabs. Discussed use of anti-inflammatories for body aches and fever and need for adequate hydration.  2. Acute tonsillitis, unspecified etiology Rapid strep test negative, throat culture pending. Asymmetrical tonsils will treat with Amoxicillin. Patient advised as follows: I would like for you to follow up in 3 days to check your tonsils. Please follow-up sooner if you have any more pressing concerns. Please go to the ER if you have any difficulty breathing, unable to take food or liquid, shortness of breath or rapid worsening symptoms.  Inda Coke PA-C 08/18/16

## 2016-08-18 NOTE — Progress Notes (Signed)
Pre visit review using our clinic review tool, if applicable. No additional management support is needed unless otherwise documented below in the visit note. 

## 2016-08-18 NOTE — Addendum Note (Signed)
Addended by: Dundee Cellar on: 08/18/2016 09:40 AM   Modules accepted: Orders

## 2016-08-20 ENCOUNTER — Telehealth: Payer: Self-pay | Admitting: Family Medicine

## 2016-08-20 LAB — CULTURE, GROUP A STREP: ORGANISM ID, BACTERIA: NORMAL

## 2016-08-20 NOTE — Telephone Encounter (Signed)
Called patient and left message to return call.   Pt has f/u appt scheduled w/ treating provider tomorrow. Recommend she keep appt as scheduled and increase fluid intake and eat bland diet as tolerated until then.   Dr. Etter Sjogren: Strep test negative. Okay to d/c atbx? Any further recs?

## 2016-08-20 NOTE — Telephone Encounter (Signed)
Can you call to triage Pt?

## 2016-08-20 NOTE — Telephone Encounter (Signed)
Ok to d/c abx

## 2016-08-20 NOTE — Telephone Encounter (Signed)
Patient called stating that she is not feeling well after using both medications, amoxicillin (AMOXIL) 500 MG tablet and oseltamivir (TAMIFLU) 75 MG capsule that she was prescribed on 08/18/16 by Inda Coke. She states that she is very nauseated and "swimmy headed" She states she hasn't eaten very much. She is feeling better from the flu but now is dealing with these new symptoms. She is wondering about her throat culture and whether she can discontinue use of the antibiotic. Please advise  Phone: [phone]

## 2016-08-21 ENCOUNTER — Encounter: Payer: Self-pay | Admitting: Physician Assistant

## 2016-08-21 ENCOUNTER — Ambulatory Visit (INDEPENDENT_AMBULATORY_CARE_PROVIDER_SITE_OTHER): Payer: 59 | Admitting: Physician Assistant

## 2016-08-21 ENCOUNTER — Encounter: Payer: Self-pay | Admitting: *Deleted

## 2016-08-21 VITALS — BP 116/81 | HR 88 | Temp 98.9°F | Ht 61.0 in | Wt 180.6 lb

## 2016-08-21 DIAGNOSIS — J039 Acute tonsillitis, unspecified: Secondary | ICD-10-CM | POA: Diagnosis not present

## 2016-08-21 LAB — CBC WITH DIFFERENTIAL/PLATELET
BASOS ABS: 0 10*3/uL (ref 0.0–0.1)
Basophils Relative: 0.4 % (ref 0.0–3.0)
Eosinophils Absolute: 0.1 10*3/uL (ref 0.0–0.7)
Eosinophils Relative: 1.5 % (ref 0.0–5.0)
HEMATOCRIT: 41.7 % (ref 36.0–46.0)
Hemoglobin: 14.3 g/dL (ref 12.0–15.0)
LYMPHS PCT: 54.9 % — AB (ref 12.0–46.0)
Lymphs Abs: 4.7 10*3/uL — ABNORMAL HIGH (ref 0.7–4.0)
MCHC: 34.3 g/dL (ref 30.0–36.0)
MCV: 93.7 fl (ref 78.0–100.0)
MONOS PCT: 5.3 % (ref 3.0–12.0)
Monocytes Absolute: 0.5 10*3/uL (ref 0.1–1.0)
NEUTROS ABS: 3.2 10*3/uL (ref 1.4–7.7)
Neutrophils Relative %: 37.9 % — ABNORMAL LOW (ref 43.0–77.0)
Platelets: 299 10*3/uL (ref 150.0–400.0)
RBC: 4.45 Mil/uL (ref 3.87–5.11)
RDW: 12.7 % (ref 11.5–15.5)
WBC: 8.5 10*3/uL (ref 4.0–10.5)

## 2016-08-21 MED ORDER — GUAIFENESIN-CODEINE 100-10 MG/5ML PO SYRP: 5.0000 mL | ORAL_SOLUTION | Freq: Three times a day (TID) | ORAL | 0 refills | Status: DC | PRN

## 2016-08-21 NOTE — Telephone Encounter (Signed)
MyChart message sent to pt notifying her of below.

## 2016-08-21 NOTE — Progress Notes (Signed)
Subjective:    Patient ID: Thayer Headings, female    DOB: 1976-07-28, 41 y.o.   MRN: [id number]  HPI Ms. Sedberry is a 41 y/o female who presents for re-check for her throat. She was seen on 08/18/16 for presumed flu and tonsillitis. Since that time, her throat pain has improved. She is able to eat and drink without difficulty, although appetite has remained poor. She is taking Tylenol occassionally. Her strep test and throat culture were both negative and she was advised to stop her antibiotics (amoxicillin) yesterday, she was on Day 3, when she called the office. She called yesterday because she has been "swimmy headed" and felt as thought it was a side effect from all of the medication she was taking. Her symptoms have had some improvement since discontinuing the antibiotic, but she also admits that her symptoms are likely secondary to her sleeping poorly because of her cough. She has not had any fevers. Also denies SOB, stridor, inability to eat or drink, uncontrollable drooling, chest pain. She is on day 4 of 5 of Tamiflu.  Review of Systems  See HPI  Past Medical History:  Diagnosis Date  . Allergic rhinitis   . History of chicken pox   . History of shingles   . Hyperlipidemia      Social History   Social History  . Marital status: Married    Spouse name: N/A  . Number of children: N/A  . Years of education: N/A   Occupational History  . Not on file.   Social History Main Topics  . Smoking status: Never Smoker  . Smokeless tobacco: Never Used  . Alcohol use No  . Drug use: No  . Sexual activity: Yes   Other Topics Concern  . Not on file   Social History Narrative  . No narrative on file    Past Surgical History:  Procedure Laterality Date  . CHOLECYSTECTOMY    . HERNIA REPAIR    . wisdome teeth      Family History  Problem Relation Age of Onset  . Hypertension Maternal Grandmother   . Hypertension Maternal Grandfather   . Hypertension Paternal Grandmother     . Heart disease Paternal Grandmother   . Hypertension Paternal Grandfather   . Heart disease Paternal Grandfather   . Anesthesia problems Neg Hx   . Hypotension Neg Hx   . Pseudochol deficiency Neg Hx   . Malignant hyperthermia Neg Hx     Allergies  Allergen Reactions  . Sulfa Antibiotics     Childhood reaction    Current Outpatient Prescriptions on File Prior to Visit  Medication Sig Dispense Refill  . cyclobenzaprine (FLEXERIL) 10 MG tablet Take 1/2 or 1 at bedtime as needed for muscle spasm 30 tablet 0  . ibuprofen (ADVIL,MOTRIN) 200 MG tablet Take 200 mg by mouth every 6 (six) hours as needed.    . Multiple Vitamin (MULTIVITAMIN) tablet Take 1 tablet by mouth daily.    . naproxen sodium (ALEVE) 220 MG tablet Take 220 mg by mouth as needed.    Marland Kitchen oseltamivir (TAMIFLU) 75 MG capsule Take 1 capsule (75 mg total) by mouth 2 (two) times daily. 10 capsule 0  . SUMAtriptan (IMITREX) 100 MG tablet Take 1/2 or 1 at first sign of migraine headache. May repeat in 2 hours if needed.  Max 200 in 24 hours 5 tablet 0  . amoxicillin (AMOXIL) 500 MG tablet Take 1 tablet (500 mg total) by mouth 2 (two)  times daily. (Patient not taking: Reported on 08/21/2016) 20 tablet 0   No current facility-administered medications on file prior to visit.     BP 116/81   Pulse 88   Temp 98.9 F (37.2 C) (Oral)   Ht 5\' 1"  (1.549 m)   Wt 180 lb 9.6 oz (81.9 kg)   SpO2 97%   BMI 34.12 kg/m        Objective:   Physical Exam  Constitutional: Vital signs are normal. She appears well-developed and well-nourished.  HENT:  Head: Normocephalic and atraumatic.  Right Ear: Tympanic membrane, external ear and ear canal normal. Tympanic membrane is not erythematous, not retracted and not bulging.  Left Ear: Tympanic membrane, external ear and ear canal normal. Tympanic membrane is not erythematous, not retracted and not bulging.  Mouth/Throat: Mucous membranes are normal. Posterior oropharyngeal edema and  posterior oropharyngeal erythema present. No oropharyngeal exudate.  R tonsil 2+; L tonsil 1+; post nasal drip present     Neck: Trachea normal.  Cardiovascular: Normal rate, regular rhythm and normal heart sounds.   Pulmonary/Chest: Effort normal and breath sounds normal. No accessory muscle usage. No respiratory distress.  Lymphadenopathy:    She has no cervical adenopathy.  Nursing note and vitals reviewed.     Assessment & Plan:  1. Acute tonsillitis, unspecified etiology Symptoms greatly improved since last seen by me on 08/18/16. Tonsils appear slightly improved, however R tonsil is greater than L. Will order stat CBC with Differential/Platelet to assess r/o any underlying infectious process. Low suspicion for tonsillar abscess, however if markedly elevated, consider further work-up. Will call patient with results when available. Cheratussin also prescribed for cough. Stop Tamiflu secondary to symptoms.  Inda Coke PA-C 08/21/16

## 2016-08-21 NOTE — Patient Instructions (Signed)
Thanks for following up with [place] today!  You can stop the Tamiflu. Take the cough syrup as needed, it may cause drowsiness so take while at home.  Contact a health care provider if:  Large, tender lumps develop in your neck.  A rash develops.  A green, yellow-brown, or bloody substance is coughed up.    You notice that only one of the tonsils is swollen. Get help right away if:  You develop any new symptoms such as vomiting, severe headache, stiff neck, chest pain, or trouble breathing or swallowing.  You have severe throat pain along with drooling or voice changes.  You have severe pain, unrelieved with recommended medications.  You are unable to fully open the mouth.  You develop redness, swelling, or severe pain anywhere in the neck.  You have a fever.  You are unable to swallow liquids or food

## 2016-08-21 NOTE — Progress Notes (Signed)
Pre visit review using our clinic tool,if applicable. No additional management support is needed unless otherwise documented below in the visit note.  

## 2016-09-10 ENCOUNTER — Encounter: Payer: Self-pay | Admitting: Family Medicine

## 2016-11-30 ENCOUNTER — Other Ambulatory Visit: Payer: Self-pay | Admitting: Certified Nurse Midwife

## 2016-11-30 MED ORDER — PHENTERMINE HCL 37.5 MG PO CAPS: 37.5000 mg | ORAL_CAPSULE | ORAL | 3 refills | Status: DC

## 2016-12-07 MED FILL — PHENTERMINE 37.5 MG CAPSULE: 37.5 | 30 days supply | Qty: 30 | Fill #0

## 2017-01-27 DIAGNOSIS — Z01419 Encounter for gynecological examination (general) (routine) without abnormal findings: Secondary | ICD-10-CM | POA: Diagnosis not present

## 2017-01-27 DIAGNOSIS — Z6833 Body mass index (BMI) 33.0-33.9, adult: Secondary | ICD-10-CM | POA: Diagnosis not present

## 2017-03-03 ENCOUNTER — Telehealth: Payer: 59 | Admitting: Nurse Practitioner

## 2017-03-03 ENCOUNTER — Telehealth: Payer: Self-pay | Admitting: Family Medicine

## 2017-03-03 DIAGNOSIS — R05 Cough: Secondary | ICD-10-CM

## 2017-03-03 DIAGNOSIS — R059 Cough, unspecified: Secondary | ICD-10-CM

## 2017-03-03 MED ORDER — AZITHROMYCIN 250 MG PO TABS: ORAL_TABLET | ORAL | 0 refills | Status: DC

## 2017-03-03 MED ORDER — BENZONATATE 100 MG PO CAPS: 100.0000 mg | ORAL_CAPSULE | Freq: Three times a day (TID) | ORAL | 0 refills | Status: DC | PRN

## 2017-03-03 NOTE — Telephone Encounter (Signed)
Pt would like to know if she is due any labs? She said that she do not have any health issues. She would just like to stay on labs if she need them?    Please advise.

## 2017-03-03 NOTE — Progress Notes (Signed)
We are sorry that you are not feeling well.  Here is how we plan to help!  Based on your presentation I believe you most likely have A cough due to bacteria.  When patients have a fever and a productive cough with a change in color or increased sputum production, we are concerned about bacterial bronchitis.  If left untreated it can progress to pneumonia.  If your symptoms do not improve with your treatment plan it is important that you contact your provider.   I have prescribed Azithromyin 250 mg: two tables now and then one tablet daily for 4 additonal days    In addition you may use A prescription cough medication called Tessalon Perles 100mg . You may take 1-2 capsules every 8 hours as needed for your cough.    From your responses in the eVisit questionnaire you describe inflammation in the upper respiratory tract which is causing a significant cough.  This is commonly called Bronchitis and has four common causes:    Allergies  Viral Infections  Acid Reflux  Bacterial Infection Allergies, viruses and acid reflux are treated by controlling symptoms or eliminating the cause. An example might be a cough caused by taking certain blood pressure medications. You stop the cough by changing the medication. Another example might be a cough caused by acid reflux. Controlling the reflux helps control the cough.  USE OF BRONCHODILATOR ("RESCUE") INHALERS: There is a risk from using your bronchodilator too frequently.  The risk is that over-reliance on a medication which only relaxes the muscles surrounding the breathing tubes can reduce the effectiveness of medications prescribed to reduce swelling and congestion of the tubes themselves.  Although you feel brief relief from the bronchodilator inhaler, your asthma may actually be worsening with the tubes becoming more swollen and filled with mucus.  This can delay other crucial treatments, such as oral steroid medications. If you need to use a  bronchodilator inhaler daily, several times per day, you should discuss this with your provider.  There are probably better treatments that could be used to keep your asthma under control.     HOME CARE . Only take medications as instructed by your medical team. . Complete the entire course of an antibiotic. . Drink plenty of fluids and get plenty of rest. . Avoid close contacts especially the very young and the elderly . Cover your mouth if you cough or cough into your sleeve. . Always remember to wash your hands . A steam or ultrasonic humidifier can help congestion.   GET HELP RIGHT AWAY IF: . You develop worsening fever. . You become short of breath . You cough up blood. . Your symptoms persist after you have completed your treatment plan MAKE SURE YOU   Understand these instructions.  Will watch your condition.  Will get help right away if you are not doing well or get worse.  Your e-visit answers were reviewed by a board certified advanced clinical practitioner to complete your personal care plan.  Depending on the condition, your plan could have included both over the counter or prescription medications. If there is a problem please reply  once you have received a response from your provider. Your safety is important to [place].  If you have drug allergies check your prescription carefully.    You can use MyChart to ask questions about today's visit, request a non-urgent call back, or ask for a work or school excuse for 24 hours related to this e-Visit. If it has  greater than 24 hours you will need to follow up with your provider, or enter a new e-Visit to address those concerns. You will get an e-mail in the next two days asking about your experience.  I hope that your e-visit has been valuable and will speed your recovery. Thank you for using e-visits.   

## 2017-03-08 NOTE — Telephone Encounter (Signed)
In reviewing patient's chart, she is due for a physical exam.  Please call and schedule.

## 2017-03-08 NOTE — Telephone Encounter (Signed)
Pt has been scheduled.  °

## 2017-04-09 ENCOUNTER — Encounter: Payer: Self-pay | Admitting: Medical

## 2017-04-09 ENCOUNTER — Ambulatory Visit (INDEPENDENT_AMBULATORY_CARE_PROVIDER_SITE_OTHER): Payer: 59 | Admitting: Medical

## 2017-04-09 ENCOUNTER — Ambulatory Visit (HOSPITAL_BASED_OUTPATIENT_CLINIC_OR_DEPARTMENT_OTHER)
Admission: RE | Admit: 2017-04-09 | Discharge: 2017-04-09 | Disposition: A | Discharge status: Home / Self Care | Payer: 59 | Source: Ambulatory Visit | Attending: Medical | Admitting: Medical

## 2017-04-09 ENCOUNTER — Telehealth: Payer: Self-pay | Admitting: Medical

## 2017-04-09 VITALS — BP 127/90 | HR 84 | Temp 98.3°F | Resp 16 | Ht 61.0 in | Wt 180.8 lb

## 2017-04-09 DIAGNOSIS — M79671 Pain in right foot: Secondary | ICD-10-CM | POA: Diagnosis not present

## 2017-04-09 DIAGNOSIS — S92511A Displaced fracture of proximal phalanx of right lesser toe(s), initial encounter for closed fracture: Secondary | ICD-10-CM | POA: Diagnosis not present

## 2017-04-09 DIAGNOSIS — X58XXXA Exposure to other specified factors, initial encounter: Secondary | ICD-10-CM | POA: Diagnosis not present

## 2017-04-09 DIAGNOSIS — S92901A Unspecified fracture of right foot, initial encounter for closed fracture: Secondary | ICD-10-CM

## 2017-04-09 MED ORDER — HYDROCODONE-ACETAMINOPHEN 10-325 MG PO TABS: 1.0000 | ORAL_TABLET | Freq: Four times a day (QID) | ORAL | 0 refills | Status: DC | PRN

## 2017-04-09 NOTE — Progress Notes (Signed)
   Subjective:    Patient ID: Tricia [name], female    DOB: 05-Aug-1976, 41 y.o.   MRN: [id number]  HPI  Pt in for rt foot pain. She accidentally kicked 25 lb pound dumbell. She did it this morning.   It hurts to walk. Pain level when she walks to be 8-9/10.  Patient does have an old postop boot that she could use over the weekend.  Patient is not diabetic and suffered no laceration to her foot.  She indicates that she can rest this weekend and not have to walk much.   LMP- MIrena.     Review of Systems  Musculoskeletal:       Right foot pain with bruise at the base of toes.  Skin: Negative for rash.  Hematological: Negative for adenopathy. Bruises/bleeds easily.       Objective:   Physical Exam   General- No acute distress. Pleasant patient. Neurologic- CNII- XII grossly intact. Rt foot- at base of 3rd and 4th toe bruising. Mild swollen. Tender to palpation. Normal pulses. Good capillary refill.      Assessment & Plan:  For your right foot pain/injury, I am ordering x-rays of your right foot. Please go downstairs and get that done now.  I would recommend that she use her postop shoe/brace that you have at home. This will stabilize the area pending x-ray review. When I review the results I will my chart you but if there are significant findings to discuss try to call you.  If fracture is seen in metatarsal bones will try to get you in with orthopedist on Tuesday. Also if needed would recommend after orthopedic clinic but I don't think that will be necessary.  For mild to moderate pain ibuprofen over-the-counter. For more severe pain making Norco available over the weekend.  Follow-up date to be determined after x-ray reviewed.

## 2017-04-09 NOTE — Patient Instructions (Addendum)
  For your right foot pain/injury, I am ordering x-rays of your right foot. Please go downstairs and get that done now.  I would recommend that she use her postop shoe/brace that you have at home. This will stabilize the area pending x-ray review. When I review the results I will my chart you but if there are significant findings to discuss try to call you.  If fracture is seen in metatarsal bones will try to get you in with orthopedist on Tuesday. Also if needed would recommend after orthopedic clinic but I don't think that will be necessary.  For mild to moderate pain ibuprofen over-the-counter. For more severe pain making Norco available over the weekend.  Follow-up date to be determined after x-ray reviewed.

## 2017-04-09 NOTE — Telephone Encounter (Signed)
Please see the referral to sports medicine/Dr. Barbaraann Barthel. Continue get her in Wednesday or Thursday?

## 2017-04-09 NOTE — Telephone Encounter (Signed)
Opened to review 

## 2017-04-13 NOTE — Telephone Encounter (Signed)
Referral has been sent to Dr Barbaraann Barthel, their office will contact patient

## 2017-04-16 ENCOUNTER — Ambulatory Visit (INDEPENDENT_AMBULATORY_CARE_PROVIDER_SITE_OTHER): Payer: 59 | Admitting: Family Medicine

## 2017-04-16 ENCOUNTER — Encounter: Payer: Self-pay | Admitting: Family Medicine

## 2017-04-16 DIAGNOSIS — S92501A Displaced unspecified fracture of right lesser toe(s), initial encounter for closed fracture: Secondary | ICD-10-CM | POA: Diagnosis not present

## 2017-04-16 NOTE — Patient Instructions (Addendum)
You have a proximal phalanx fracture of your third digit. These typically heal over 6 weeks. Wear boot or postop shoe for comfort when up and walking around. You can transition to a supportive shoe (tennis shoe, running shoe) when tolerated though. Buddy tape the middle 3 digits together if this helps you with pain. Icing 15 minutes at a time 3-4 times a day as needed. Ibuprofen or aleve if needed for inflammation and pain. Ok to take tylenol or the norco if needed in addition to this (no driving on the norco - don't take norco with tylenol as norco has tylenol in it). Follow up with me in 5 weeks only if you're having problems.

## 2017-04-19 DIAGNOSIS — S92501K Displaced unspecified fracture of right lesser toe(s), subsequent encounter for fracture with nonunion: Secondary | ICD-10-CM | POA: Insufficient documentation

## 2017-04-19 NOTE — Progress Notes (Signed)
PCP: Copland, Gay Filler, MD Consultation requested by: Mackie Pai PA-C  Subjective:   HPI: Patient is a 41 y.o. female here for right foot injury.  Patient reports on 8/31 she was in the gym and accidentally kicked a dumbbell with her right foot. Immediate pain, bruising distal 2nd-4th digits and was limping. Pain down to 4/10 and sore mainly 3rd digit. Has been taking either ibuprofen or naproxen since and Norco at nighttime for severe pain. No skin changes, numbness.  Past Medical History:  Diagnosis Date  . Allergic rhinitis   . History of chicken pox   . History of shingles   . Hyperlipidemia     Current Outpatient Prescriptions on File Prior to Visit  Medication Sig Dispense Refill  . cyclobenzaprine (FLEXERIL) 10 MG tablet Take 1/2 or 1 at bedtime as needed for muscle spasm 30 tablet 0  . HYDROcodone-acetaminophen (NORCO) 10-325 MG tablet Take 1 tablet by mouth every 6 (six) hours as needed for severe pain. 12 tablet 0  . ibuprofen (ADVIL,MOTRIN) 200 MG tablet Take 200 mg by mouth every 6 (six) hours as needed.    . Multiple Vitamin (MULTIVITAMIN) tablet Take 1 tablet by mouth daily.    . naproxen sodium (ALEVE) 220 MG tablet Take 220 mg by mouth as needed.    . SUMAtriptan (IMITREX) 100 MG tablet Take 1/2 or 1 at first sign of migraine headache. May repeat in 2 hours if needed.  Max 200 in 24 hours 5 tablet 0   No current facility-administered medications on file prior to visit.     Past Surgical History:  Procedure Laterality Date  . CHOLECYSTECTOMY    . HERNIA REPAIR    . wisdome teeth      Allergies  Allergen Reactions  . Sulfa Antibiotics     Childhood reaction    Social History   Social History  . Marital status: Married    Spouse name: N/A  . Number of children: N/A  . Years of education: N/A   Occupational History  . Not on file.   Social History Main Topics  . Smoking status: Never Smoker  . Smokeless tobacco: Never Used  . Alcohol use  No  . Drug use: No  . Sexual activity: Yes   Other Topics Concern  . Not on file   Social History Narrative  . No narrative on file    Family History  Problem Relation Age of Onset  . Hypertension Maternal Grandmother   . Hypertension Maternal Grandfather   . Hypertension Paternal Grandmother   . Heart disease Paternal Grandmother   . Hypertension Paternal Grandfather   . Heart disease Paternal Grandfather   . Anesthesia problems Neg Hx   . Hypotension Neg Hx   . Pseudochol deficiency Neg Hx   . Malignant hyperthermia Neg Hx     BP 129/87   Pulse 83   Ht 5\' 1"  (1.549 m)   Wt 181 lb (82.1 kg)   BMI 34.20 kg/m   Review of Systems: See HPI above.     Objective:  Physical Exam:  Gen: NAD, comfortable in exam room  Right foot/ankle: Swelling 3rd digit.  Bruising distal dorsal foot into 2nd-4th digits.  No malrotation or angulation. FROM ankle, able to flex and extend digits. TTP throughout 3rd digit and distal 3rd metatarsal. Negative ant drawer and talar tilt.   Negative syndesmotic compression. Negative metatarsal squeeze. Thompsons test negative. NV intact distally.  Left foot/ankle: No gross deformity, swelling, ecchymoses FROM  without pain.   Assessment & Plan:  1. Right foot injury - independently reviewed radiographs and noted nondisplaced proximal phalanx fracture of 3rd digit.  Icing, buddy taping reviewed.  Ibuprofen or aleve with norco as needed for severe pain.  Boot or postop shoe for comfort.  F/u in 5 weeks if having issues as expect complete recovery by this time.

## 2017-04-19 NOTE — Assessment & Plan Note (Signed)
independently reviewed radiographs and noted nondisplaced proximal phalanx fracture of 3rd digit.  Icing, buddy taping reviewed.  Ibuprofen or aleve with norco as needed for severe pain.  Boot or postop shoe for comfort.  F/u in 5 weeks if having issues as expect complete recovery by this time.

## 2017-05-18 NOTE — Progress Notes (Addendum)
[facility] [address], [place], [place] [address] [phone]  Date:  05/19/2017   Name:  Tricia [name]   DOB:  [birth year]   MRN:  [id number]  PCP:  [name], MD    Chief Complaint: Annual Exam (Pt here for CPE and labs. )   History of Present Illness:  Tricia [name] is a 41 y.o. very pleasant female patient who presents with the following:  Here today for a CPE Last visit with myself about a year ago, as follows:  Tricia Blow Fosteris a 41 y.o.very pleasant femalepatient who presents with the following:  Here today as a new patient. She is generally in good health- she is establishing with primary care.  She is here today with her 3 children- they are 53, 52 and 59 yo.   She has a history of scoliosis; dx around age 73 yo. She never needed surgery for this Over the last 5 years or so she has had shoulder trouble. Did some PT for a while prior to the birth of her youngest, but then could not continue due to insurance changes She has noted more frequent HA over the last 3-6 months.  She may wake up with a HA that might last for 2-3 days. She may use excedrin or another OTC medication. She has tried fioricet as well but it does not help that much.  The HA are generally in the back of her head.  No nausea or vomiting. She may have light sensitivity at times No aura.   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 She does work for [employer]- in an [place] clinic She may have a HA once every 1-2 weeks here recently  Pap: 6/18 Mammo: not done yet- she will get her IUD changed and have a mammo in the spring Flu: done this month Tetanus: she had a tdap about 10 years ago, she is not quite sure of the date Will give td today  Since her last visit her headaches have gotten a bit better, she is doing some massage which does help with her head and neck pains BP Readings from Last 3 Encounters:   05/19/17 122/80  04/16/17 129/87  04/09/17 127/90   She has been a bit concerned about her BP- it was higher than normal over the last couple of months.  However today it looks quite good- she will continue to monitor this on occasion There is a strong family history of HTN She did not have hypertension in pregnancy  She did have a latte and some toast this am- we will do labs for her  Her children are 5, 11 and 13.  The oldest is a teen and there is some stress in the family  She admtis that she is feeling anxious a lot of the time.  Not so much depressed, but tends to worry a lot about her family and kids She is interested in trying a medication for anxiety, but notes that she often forgets to take medication every day  Finally, she notes that when she gets home after work she still feels tired and worn out. She often feels like her sleep is non- restorative.  Her husband has noted that she snores  Patient Active Problem List   Diagnosis Date Noted  . Closed fracture of third toe of right foot, initial encounter 04/19/2017  . Postpartum care following vaginal delivery (  11/4) 06/15/2011    Past Medical History:  Diagnosis Date  . Allergic rhinitis   . History of chicken pox   . History of shingles   . Hyperlipidemia     Past Surgical History:  Procedure Laterality Date  . CHOLECYSTECTOMY    . HERNIA REPAIR    . wisdome teeth      Social History  Substance Use Topics  . Smoking status: Never Smoker  . Smokeless tobacco: Never Used  . Alcohol use No    Family History  Problem Relation Age of Onset  . Hypertension Maternal Grandmother   . Hypertension Maternal Grandfather   . Hypertension Paternal Grandmother   . Heart disease Paternal Grandmother   . Hypertension Paternal Grandfather   . Heart disease Paternal Grandfather   . Anesthesia problems Neg Hx   . Hypotension Neg Hx   . Pseudochol deficiency Neg Hx   . Malignant hyperthermia Neg Hx     Allergies   Allergen Reactions  . Sulfa Antibiotics     Childhood reaction    Medication list has been reviewed and updated.  Current Outpatient Prescriptions on File Prior to Visit  Medication Sig Dispense Refill  . cyclobenzaprine (FLEXERIL) 10 MG tablet Take 1/2 or 1 at bedtime as needed for muscle spasm 30 tablet 0  . ibuprofen (ADVIL,MOTRIN) 200 MG tablet Take 200 mg by mouth every 6 (six) hours as needed.    . Multiple Vitamin (MULTIVITAMIN) tablet Take 1 tablet by mouth daily.    . naproxen sodium (ALEVE) 220 MG tablet Take 220 mg by mouth as needed.    . SUMAtriptan (IMITREX) 100 MG tablet Take 1/2 or 1 at first sign of migraine headache. May repeat in 2 hours if needed.  Max 200 in 24 hours 5 tablet 0   No current facility-administered medications on file prior to visit.     Review of Systems:  As per HPI- otherwise negative.   Physical Examination: Vitals:   05/19/17 1210  BP: 122/80  Pulse: 87  Temp: 98.5 F (36.9 C)  SpO2: 98%   Vitals:   05/19/17 1210  Weight: 181 lb 3.2 oz (82.2 kg)  Height: 5' 1.5" (1.562 m)   Body mass index is 33.68 kg/m. Ideal Body Weight: Weight in (lb) to have BMI = 25: 134.2  GEN: WDWN, NAD, Non-toxic, A & O x 3, overweight, otherwise looks wel HEENT: Atraumatic, Normocephalic. Neck supple. No masses, No LAD.  Bilateral TM wnl, oropharynx normal except for large tonsils bilaterally.  PEERL,EOMI.   Ears and Nose: No external deformity. CV: RRR, No M/G/R. No JVD. No thrill. No extra heart sounds. PULM: CTA B, no wheezes, crackles, rhonchi. No retractions. No resp. distress. No accessory muscle use. ABD: S, NT, ND. No rebound. No HSM. EXTR: No c/c/e NEURO Normal gait.  PSYCH: Normally interactive. Conversant. Not depressed or anxious appearing.  Calm demeanor.    Assessment and Plan: Physical exam  Screening for deficiency anemia - Plan: CBC  Screening for diabetes mellitus - Plan: Comprehensive metabolic panel, Hemoglobin  A1c  Screening for hyperlipidemia - Plan: Lipid panel  Non-restorative sleep - Plan: Ambulatory referral to Neurology  Fatigue, unspecified type - Plan: TSH  GAD (generalized anxiety disorder) - Plan: FLUoxetine (PROZAC) 20 MG tablet  Immunization due - Plan: Td vaccine greater than or equal to 7yo preservative free IM  Here today for a CPE She does see gyn for her well woman care IUD for contraception Update tetanus today Flu  shot is UTD Labs pending as above Referral to neurology for possible sleep apnea testing  She notes anxiety- would like to try medication.  Will put her on prozac as it has a very long 1/2 life and she tends to forget her meds some days  She will send me a mychart message with an update in a couple of weeks- sooner if not doing ok  Will plan further follow- up pending labs.   Signed Lamar Blinks, MD  Received her labs, message to pt 10/14  Results for orders placed or performed in visit on 05/19/17  CBC  Result Value Ref Range   WBC 11.6 (H) 4.0 - 10.5 K/uL   RBC 4.62 3.87 - 5.11 Mil/uL   Platelets 327.0 150.0 - 400.0 K/uL   Hemoglobin 15.0 12.0 - 15.0 g/dL   HCT 45.5 36.0 - 46.0 %   MCV 98.4 78.0 - 100.0 fl   MCHC 32.9 30.0 - 36.0 g/dL   RDW 13.0 11.5 - 15.5 %  Comprehensive metabolic panel  Result Value Ref Range   Sodium 136 135 - 145 mEq/L   Potassium 4.1 3.5 - 5.1 mEq/L   Chloride 99 96 - 112 mEq/L   CO2 29 19 - 32 mEq/L   Glucose, Bld 92 70 - 99 mg/dL   BUN 11 6 - 23 mg/dL   Creatinine, Ser 0.71 0.40 - 1.20 mg/dL   Total Bilirubin 0.5 0.2 - 1.2 mg/dL   Alkaline Phosphatase 82 39 - 117 U/L   AST 20 0 - 37 U/L   ALT 18 0 - 35 U/L   Total Protein 7.9 6.0 - 8.3 g/dL   Albumin 4.4 3.5 - 5.2 g/dL   Calcium 10.3 8.4 - 10.5 mg/dL   GFR 96.90 >60.00 mL/min  Lipid panel  Result Value Ref Range   Cholesterol 224 (H) 0 - 200 mg/dL   Triglycerides 243.0 (H) 0.0 - 149.0 mg/dL   HDL 54.60 >39.00 mg/dL   VLDL 48.6 (H) 0.0 - 40.0 mg/dL    Total CHOL/HDL Ratio 4    NonHDL 169.22   Hemoglobin A1c  Result Value Ref Range   Hgb A1c MFr Bld 5.7 4.6 - 6.5 %  TSH  Result Value Ref Range   TSH 1.26 0.35 - 4.50 uIU/mL  LDL cholesterol, direct  Result Value Ref Range   Direct LDL 138.0 mg/dL

## 2017-05-19 ENCOUNTER — Ambulatory Visit (INDEPENDENT_AMBULATORY_CARE_PROVIDER_SITE_OTHER): Payer: 59 | Admitting: Family Medicine

## 2017-05-19 ENCOUNTER — Telehealth: Payer: Self-pay | Admitting: Family Medicine

## 2017-05-19 VITALS — BP 122/80 | HR 87 | Temp 98.5°F | Ht 61.5 in | Wt 181.2 lb

## 2017-05-19 DIAGNOSIS — Z13220 Encounter for screening for lipoid disorders: Secondary | ICD-10-CM

## 2017-05-19 DIAGNOSIS — F411 Generalized anxiety disorder: Secondary | ICD-10-CM | POA: Diagnosis not present

## 2017-05-19 DIAGNOSIS — Z130 Encounter for screening for diseases of the blood and blood-forming organs and certain disorders involving the immune mechanism: Secondary | ICD-10-CM

## 2017-05-19 DIAGNOSIS — Z23 Encounter for immunization: Secondary | ICD-10-CM

## 2017-05-19 DIAGNOSIS — D72820 Lymphocytosis (symptomatic): Secondary | ICD-10-CM | POA: Diagnosis not present

## 2017-05-19 DIAGNOSIS — Z131 Encounter for screening for diabetes mellitus: Secondary | ICD-10-CM | POA: Diagnosis not present

## 2017-05-19 DIAGNOSIS — R5383 Other fatigue: Secondary | ICD-10-CM

## 2017-05-19 DIAGNOSIS — Z0000 Encounter for general adult medical examination without abnormal findings: Secondary | ICD-10-CM | POA: Diagnosis not present

## 2017-05-19 DIAGNOSIS — G478 Other sleep disorders: Secondary | ICD-10-CM

## 2017-05-19 LAB — COMPREHENSIVE METABOLIC PANEL
ALBUMIN: 4.4 g/dL (ref 3.5–5.2)
ALT: 18 U/L (ref 0–35)
AST: 20 U/L (ref 0–37)
Alkaline Phosphatase: 82 U/L (ref 39–117)
BILIRUBIN TOTAL: 0.5 mg/dL (ref 0.2–1.2)
BUN: 11 mg/dL (ref 6–23)
CALCIUM: 10.3 mg/dL (ref 8.4–10.5)
CO2: 29 meq/L (ref 19–32)
CREATININE: 0.71 mg/dL (ref 0.40–1.20)
Chloride: 99 mEq/L (ref 96–112)
GFR: 96.9 mL/min (ref >60.00)
Glucose, Bld: 92 mg/dL (ref 70–99)
Potassium: 4.1 mEq/L (ref 3.5–5.1)
Sodium: 136 mEq/L (ref 135–145)
Total Protein: 7.9 g/dL (ref 6.0–8.3)

## 2017-05-19 LAB — LIPID PANEL
CHOLESTEROL: 224 mg/dL — AB (ref 0–200)
HDL: 54.6 mg/dL (ref >39.00)
NONHDL: 169.22
TRIGLYCERIDES: 243 mg/dL — AB (ref 0.0–149.0)
Total CHOL/HDL Ratio: 4
VLDL: 48.6 mg/dL — ABNORMAL HIGH (ref 0.0–40.0)

## 2017-05-19 LAB — CBC
HEMATOCRIT: 45.5 % (ref 36.0–46.0)
HEMOGLOBIN: 15 g/dL (ref 12.0–15.0)
MCHC: 32.9 g/dL (ref 30.0–36.0)
MCV: 98.4 fl (ref 78.0–100.0)
Platelets: 327 10*3/uL (ref 150.0–400.0)
RBC: 4.62 Mil/uL (ref 3.87–5.11)
RDW: 13 % (ref 11.5–15.5)
WBC: 11.6 10*3/uL — ABNORMAL HIGH (ref 4.0–10.5)

## 2017-05-19 LAB — HEMOGLOBIN A1C: Hgb A1c MFr Bld: 5.7 % (ref 4.6–6.5)

## 2017-05-19 LAB — LDL CHOLESTEROL, DIRECT: LDL DIRECT: 138 mg/dL

## 2017-05-19 LAB — TSH: TSH: 1.26 u[IU]/mL (ref 0.35–4.50)

## 2017-05-19 MED ORDER — FLUOXETINE HCL 20 MG PO TABS: 20.0000 mg | ORAL_TABLET | Freq: Every day | ORAL | 6 refills | Status: DC

## 2017-05-19 NOTE — Patient Instructions (Addendum)
It was a pleasure to see you to day as always I will be in touch with your labs asap We will refer you to neurology- I wonder if you have sleep apnea which can cause you to feel chronically tired.  They can do a sleep test for you if needed  For anxiety, let's have you try prozac 20 mg.  You can increase to 2 pills a day after 2-3 weeks if needed.  Please let me know how this works for you!   Health Maintenance, Female Adopting a healthy lifestyle and getting preventive care can go a long way to promote health and wellness. Talk with your health care provider about what schedule of regular examinations is right for you. This is a good chance for you to check in with your provider about disease prevention and staying healthy. In between checkups, there are plenty of things you can do on your own. Experts have done a lot of research about which lifestyle changes and preventive measures are most likely to keep you healthy. Ask your health care provider for more information. Weight and diet Eat a healthy diet  Be sure to include plenty of vegetables, fruits, low-fat dairy products, and lean protein.  Do not eat a lot of foods high in solid fats, added sugars, or salt.  Get regular exercise. This is one of the most important things you can do for your health. ? Most adults should exercise for at least 150 minutes each week. The exercise should increase your heart rate and make you sweat (moderate-intensity exercise). ? Most adults should also do strengthening exercises at least twice a week. This is in addition to the moderate-intensity exercise.  Maintain a healthy weight  Body mass index (BMI) is a measurement that can be used to identify possible weight problems. It estimates body fat based on height and weight. Your health care provider can help determine your BMI and help you achieve or maintain a healthy weight.  For females 23 years of age and older: ? A BMI below 18.5 is considered  underweight. ? A BMI of 18.5 to 24.9 is normal. ? A BMI of 25 to 29.9 is considered overweight. ? A BMI of 30 and above is considered obese.  Watch levels of cholesterol and blood lipids  You should start having your blood tested for lipids and cholesterol at 40 years of age, then have this test every 5 years.  You may need to have your cholesterol levels checked more often if: ? Your lipid or cholesterol levels are high. ? You are older than 40 years of age. ? You are at high risk for heart disease.  Cancer screening Lung Cancer  Lung cancer screening is recommended for adults 75-11 years old who are at high risk for lung cancer because of a history of smoking.  A yearly low-dose CT scan of the lungs is recommended for people who: ? Currently smoke. ? Have quit within the past 15 years. ? Have at least a 30-pack-year history of smoking. A pack year is smoking an average of one pack of cigarettes a day for 1 year.  Yearly screening should continue until it has been 15 years since you quit.  Yearly screening should stop if you develop a health problem that would prevent you from having lung cancer treatment.  Breast Cancer  Practice breast self-awareness. This means understanding how your breasts normally appear and feel.  It also means doing regular breast self-exams. Let your health care  provider know about any changes, no matter how small.  If you are in your 20s or 30s, you should have a clinical breast exam (CBE) by a health care provider every 1-3 years as part of a regular health exam.  If you are 63 or older, have a CBE every year. Also consider having a breast X-ray (mammogram) every year.  If you have a family history of breast cancer, talk to your health care provider about genetic screening.  If you are at high risk for breast cancer, talk to your health care provider about having an MRI and a mammogram every year.  Breast cancer gene (BRCA) assessment is  recommended for women who have family members with BRCA-related cancers. BRCA-related cancers include: ? Breast. ? Ovarian. ? Tubal. ? Peritoneal cancers.  Results of the assessment will determine the need for genetic counseling and BRCA1 and BRCA2 testing.  Cervical Cancer Your health care provider may recommend that you be screened regularly for cancer of the pelvic organs (ovaries, uterus, and vagina). This screening involves a pelvic examination, including checking for microscopic changes to the surface of your cervix (Pap test). You may be encouraged to have this screening done every 3 years, beginning at age 53.  For women ages 43-65, health care providers may recommend pelvic exams and Pap testing every 3 years, or they may recommend the Pap and pelvic exam, combined with testing for human papilloma virus (HPV), every 5 years. Some types of HPV increase your risk of cervical cancer. Testing for HPV may also be done on women of any age with unclear Pap test results.  Other health care providers may not recommend any screening for nonpregnant women who are considered low risk for pelvic cancer and who do not have symptoms. Ask your health care provider if a screening pelvic exam is right for you.  If you have had past treatment for cervical cancer or a condition that could lead to cancer, you need Pap tests and screening for cancer for at least 20 years after your treatment. If Pap tests have been discontinued, your risk factors (such as having a new sexual partner) need to be reassessed to determine if screening should resume. Some women have medical problems that increase the chance of getting cervical cancer. In these cases, your health care provider may recommend more frequent screening and Pap tests.  Colorectal Cancer  This type of cancer can be detected and often prevented.  Routine colorectal cancer screening usually begins at 40 years of age and continues through 40 years of  age.  Your health care provider may recommend screening at an earlier age if you have risk factors for colon cancer.  Your health care provider may also recommend using home test kits to check for hidden blood in the stool.  A small camera at the end of a tube can be used to examine your colon directly (sigmoidoscopy or colonoscopy). This is done to check for the earliest forms of colorectal cancer.  Routine screening usually begins at age 64.  Direct examination of the colon should be repeated every 5-10 years through 40 years of age. However, you may need to be screened more often if early forms of precancerous polyps or small growths are found.  Skin Cancer  Check your skin from head to toe regularly.  Tell your health care provider about any new moles or changes in moles, especially if there is a change in a mole's shape or color.  Also tell your  health care provider if you have a mole that is larger than the size of a pencil eraser.  Always use sunscreen. Apply sunscreen liberally and repeatedly throughout the day.  Protect yourself by wearing long sleeves, pants, a wide-brimmed hat, and sunglasses whenever you are outside.  Heart disease, diabetes, and high blood pressure  High blood pressure causes heart disease and increases the risk of stroke. High blood pressure is more likely to develop in: ? People who have blood pressure in the high end of the normal range (130-139/85-89 mm Hg). ? People who are overweight or obese. ? People who are African American.  If you are 38-107 years of age, have your blood pressure checked every 3-5 years. If you are 37 years of age or older, have your blood pressure checked every year. You should have your blood pressure measured twice-once when you are at a hospital or clinic, and once when you are not at a hospital or clinic. Record the average of the two measurements. To check your blood pressure when you are not at a hospital or clinic, you  can use: ? An automated blood pressure machine at a pharmacy. ? A home blood pressure monitor.  If you are between 73 years and 77 years old, ask your health care provider if you should take aspirin to prevent strokes.  Have regular diabetes screenings. This involves taking a blood sample to check your fasting blood sugar level. ? If you are at a normal weight and have a low risk for diabetes, have this test once every three years after 40 years of age. ? If you are overweight and have a high risk for diabetes, consider being tested at a younger age or more often. Preventing infection Hepatitis B  If you have a higher risk for hepatitis B, you should be screened for this virus. You are considered at high risk for hepatitis B if: ? You were born in a country where hepatitis B is common. Ask your health care provider which countries are considered high risk. ? Your parents were born in a high-risk country, and you have not been immunized against hepatitis B (hepatitis B vaccine). ? You have HIV or AIDS. ? You use needles to inject street drugs. ? You live with someone who has hepatitis B. ? You have had sex with someone who has hepatitis B. ? You get hemodialysis treatment. ? You take certain medicines for conditions, including cancer, organ transplantation, and autoimmune conditions.  Hepatitis C  Blood testing is recommended for: ? Everyone born from [birth year] through 1964. ? Anyone with known risk factors for hepatitis C.  Sexually transmitted infections (STIs)  You should be screened for sexually transmitted infections (STIs) including gonorrhea and chlamydia if: ? You are sexually active and are younger than 40 years of age. ? You are older than 40 years of age and your health care provider tells you that you are at risk for this type of infection. ? Your sexual activity has changed since you were last screened and you are at an increased risk for chlamydia or gonorrhea. Ask your  health care provider if you are at risk.  If you do not have HIV, but are at risk, it may be recommended that you take a prescription medicine daily to prevent HIV infection. This is called pre-exposure prophylaxis (PrEP). You are considered at risk if: ? You are sexually active and do not regularly use condoms or know the HIV status of your partner(s). ?  You take drugs by injection. ? You are sexually active with a partner who has HIV.  Talk with your health care provider about whether you are at high risk of being infected with HIV. If you choose to begin PrEP, you should first be tested for HIV. You should then be tested every 3 months for as long as you are taking PrEP. Pregnancy  If you are premenopausal and you may become pregnant, ask your health care provider about preconception counseling.  If you may become pregnant, take 400 to 800 micrograms (mcg) of folic acid every day.  If you want to prevent pregnancy, talk to your health care provider about birth control (contraception). Osteoporosis and menopause  Osteoporosis is a disease in which the bones lose minerals and strength with aging. This can result in serious bone fractures. Your risk for osteoporosis can be identified using a bone density scan.  If you are 67 years of age or older, or if you are at risk for osteoporosis and fractures, ask your health care provider if you should be screened.  Ask your health care provider whether you should take a calcium or vitamin D supplement to lower your risk for osteoporosis.  Menopause may have certain physical symptoms and risks.  Hormone replacement therapy may reduce some of these symptoms and risks. Talk to your health care provider about whether hormone replacement therapy is right for you. Follow these instructions at home:  Schedule regular health, dental, and eye exams.  Stay current with your immunizations.  Do not use any tobacco products including cigarettes, chewing  tobacco, or electronic cigarettes.  If you are pregnant, do not drink alcohol.  If you are breastfeeding, limit how much and how often you drink alcohol.  Limit alcohol intake to no more than 1 drink per day for nonpregnant women. One drink equals 12 ounces of beer, 5 ounces of wine, or 1 ounces of hard liquor.  Do not use street drugs.  Do not share needles.  Ask your health care provider for help if you need support or information about quitting drugs.  Tell your health care provider if you often feel depressed.  Tell your health care provider if you have ever been abused or do not feel safe at home. This information is not intended to replace advice given to you by your health care provider. Make sure you discuss any questions you have with your health care provider. Document Released: 02/09/2011 Document Revised: 01/02/2016 Document Reviewed: 04/30/2015 Elsevier Interactive Patient Education  [employer].

## 2017-05-19 NOTE — Telephone Encounter (Signed)
°  Relation to SA:YTKZ Call back number:[phone]  Reason for call:  Patient dropped of [employer] accident claim form for 04/09/17 date of service patient was seen by Percell Miller, placed form in paperwork bin. Informed patient 5 to 7 business day turnaround.

## 2017-05-20 ENCOUNTER — Ambulatory Visit: Payer: 59 | Admitting: Family Medicine

## 2017-05-20 NOTE — Telephone Encounter (Signed)
Completed as much as possible on [employer] Accident Claim Form; forwarded to provider/SLS 10/11

## 2017-05-23 ENCOUNTER — Encounter: Payer: Self-pay | Admitting: Family Medicine

## 2017-05-23 NOTE — Addendum Note (Signed)
Addended by: Lamar Blinks C on: 05/23/2017 12:20 PM   Modules accepted: Orders

## 2017-05-24 ENCOUNTER — Encounter: Payer: 59 | Admitting: Family Medicine

## 2017-05-25 ENCOUNTER — Telehealth: Payer: Self-pay | Admitting: Medical

## 2017-05-25 NOTE — Telephone Encounter (Signed)
I don't remember telling pt not to work. But can see that she would have been necessary/reasonable(don't remember work question coming up). I referred her to Dr. Felipe Drone.  How many days did she miss. When did she stop working.  Did I give her a work note. Can you find that in epic.  I need to review this information before I can fill out her accident claim form.

## 2017-05-26 ENCOUNTER — Encounter: Payer: Self-pay | Admitting: Medical

## 2017-05-26 ENCOUNTER — Telehealth: Payer: Self-pay | Admitting: Medical

## 2017-05-26 NOTE — Telephone Encounter (Signed)
I don't see any note to be excuse from work.

## 2017-05-26 NOTE — Telephone Encounter (Signed)
Would you look in epic and see if I gave pt a work note for her toe fracture injury. Or did Dr. Felipe Drone give her days off or recommend time off work.

## 2017-05-26 NOTE — Telephone Encounter (Signed)
Patient Calls  Saguier, Percell Miller, [place] routed conversation to You 9 hours ago (9:57 PM)    Saguier, Percell Miller, PA-C 9 hours ago (9:52 PM)      I don't remember telling pt not to work. But can see that she would have been necessary/reasonable(don't remember work question coming up). I referred her to Dr. Felipe Drone.  How many days did she miss. When did she stop working.  Did I give her a work note. Can you find that in epic.  I need to review this information before I can fill out her accident claim form.

## 2017-05-26 NOTE — Telephone Encounter (Signed)
I just got your voicemail regarding my paperwork.  I did not miss any work due to this problem. I was able to wear Boot and work.  I think all they need for this claim is Dx codes and procdures (x-ray) and any medical notes that can be sent from that visit.   I will check with Dr Ericka Pontiff office for notes from him as well.   Above is copy of my chart message pt sent me. I fill out accident claim form based on above.(form given to [facility]). I signed.

## 2017-05-26 NOTE — Telephone Encounter (Signed)
I left a message with patient since she did not pick up. I explained to her that time per the note we did not specifically discuss time off work. Also medical system did not see a work note in the chart. I do think it is reasonable that she would've missed some work due to the toe fracture and pain ambulating. But I wanted to review this with her before filling out the form/signing off. So I asked her to call back or to my chart me. I explained that if I'm not available she could also speak to [facility]. Go to sleep

## 2017-05-26 NOTE — Telephone Encounter (Addendum)
All of my information comes from chart, provider and/or patient, in the future I can complete only the known diagnosis and information related to that diagnosis, so there are no discrepancies, and I will relay any information given by patient for verification. I apologize.Tricia [name] 10/17

## 2017-05-26 NOTE — Telephone Encounter (Signed)
Copy & paste to open phone note.

## 2017-05-27 NOTE — Telephone Encounter (Signed)
Relation to RV:UYEB Call back number: [phone] (M) Pharmacy:  Reason for call:  Patient checking on the status of paperwork, please advise via phone call [phone] (M)

## 2017-05-28 ENCOUNTER — Encounter: Payer: Self-pay | Admitting: Family Medicine

## 2017-05-28 DIAGNOSIS — G478 Other sleep disorders: Secondary | ICD-10-CM

## 2017-06-25 ENCOUNTER — Other Ambulatory Visit (INDEPENDENT_AMBULATORY_CARE_PROVIDER_SITE_OTHER): Payer: 59

## 2017-06-25 DIAGNOSIS — D72820 Lymphocytosis (symptomatic): Secondary | ICD-10-CM | POA: Diagnosis not present

## 2017-06-25 LAB — CBC
HEMATOCRIT: 43.3 % (ref 36.0–46.0)
HEMOGLOBIN: 14.4 g/dL (ref 12.0–15.0)
MCHC: 33.4 g/dL (ref 30.0–36.0)
MCV: 97.6 fl (ref 78.0–100.0)
Platelets: 327 10*3/uL (ref 150.0–400.0)
RBC: 4.43 Mil/uL (ref 3.87–5.11)
RDW: 12.7 % (ref 11.5–15.5)
WBC: 9.4 10*3/uL (ref 4.0–10.5)

## 2017-06-26 ENCOUNTER — Encounter: Payer: Self-pay | Admitting: Family Medicine

## 2017-07-13 ENCOUNTER — Telehealth: Payer: Self-pay | Admitting: Neurology

## 2017-07-13 ENCOUNTER — Ambulatory Visit: Payer: 59 | Admitting: Neurology

## 2017-07-13 ENCOUNTER — Encounter: Payer: Self-pay | Admitting: Neurology

## 2017-07-13 VITALS — BP 140/85 | HR 88 | Ht 61.0 in | Wt 183.0 lb

## 2017-07-13 DIAGNOSIS — R51 Headache: Secondary | ICD-10-CM | POA: Diagnosis not present

## 2017-07-13 DIAGNOSIS — R519 Headache, unspecified: Secondary | ICD-10-CM

## 2017-07-13 DIAGNOSIS — G4719 Other hypersomnia: Secondary | ICD-10-CM | POA: Diagnosis not present

## 2017-07-13 DIAGNOSIS — E669 Obesity, unspecified: Secondary | ICD-10-CM | POA: Diagnosis not present

## 2017-07-13 DIAGNOSIS — R0683 Snoring: Secondary | ICD-10-CM | POA: Diagnosis not present

## 2017-07-13 DIAGNOSIS — G478 Other sleep disorders: Secondary | ICD-10-CM | POA: Diagnosis not present

## 2017-07-13 NOTE — Patient Instructions (Addendum)

## 2017-07-13 NOTE — Telephone Encounter (Signed)
I called pt back. She reports that she spoke with her husband and he notices that pt twitches in her sleep, but does not have much RLS. She says that Dr. Rexene Alberts asked her for this information and  wanted to give her an update. I advised pt that I would make Dr. Rexene Alberts aware of this information.

## 2017-07-13 NOTE — Telephone Encounter (Signed)
Pt called back she forgot to tell RN about some other information. Please call

## 2017-07-13 NOTE — Progress Notes (Signed)
Subjective:    Tricia [name] ID: Tricia Tricia [name] is a 41 y.o. female.  HPI     [name], MD, [name] [facility] [address] [place], [place] [address]  Dear Dr. Lorelei Pont,  I saw your Tricia [name], Tricia Tricia [name], upon your kind request in my neurologic clinic today for initial consultation of her sleep disorder, in particular, concern for underlying obstructive sleep apnea. Tricia Tricia [name] is unaccompanied today. As you know, Tricia Tricia [name] is a 41 year old right-handed woman with an underlying medical history of scoliosis, allergic rhinitis, history of shingles, hyperlipidemia, and obesity, who reports snoring and excessive daytime somnolence. She has had morning headaches. I reviewed your office note from 05/19/2017. Her Epworth sleepiness score is 14 out of 24, fatigue score is 39 out of 63. She lives at home with her husband and 3 children. 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 She is a nonsmoker and drinks alcohol occasionally, caffeine on a daily basis in Tricia form of coffee, soda and tea, multiple servings per day, including at night. Bedtime is generally between 10 and 11 PM, wakeup time between 6 and 6:30 AM. She does not wake up rested. She does have multiple nighttime awakenings for unclear reasons, denies any telltale symptoms of restless leg syndrome her leg twitching at night, she has been told in Tricia past year that she has been snoring more consistently. She denies any night to night nocturia. She's not aware of any family history of OSA except one of her grandmothers having a CPAP machine she recalls. She has had recurrent headaches, particularly in Tricia occipital and posterior neck areas. This is not a throbbing headache such as migrainous headache, no associated photophobia or nausea or vomiting thankfully. Feels like a more dull achy nagging headache. She has also had shoulder muscle problems and has been seen by orthopedics in Tricia past for this and her  scoliosis. She has done physical therapy for this.  Her Past Medical History Is Significant For: Past Medical History:  Diagnosis Date  . Allergic rhinitis   . History of chicken pox   . History of shingles   . Hyperlipidemia     Her Past Surgical History Is Significant For: Past Surgical History:  Procedure Laterality Date  . CHOLECYSTECTOMY    . HERNIA REPAIR    . wisdome teeth      Her Family History Is Significant For: Family History  Problem Relation Age of Onset  . Hypertension Maternal Grandmother   . Hypertension Maternal Grandfather   . Hypertension Paternal Grandmother   . Heart disease Paternal Grandmother   . Hypertension Paternal Grandfather   . Heart disease Paternal Grandfather   . Anesthesia problems Neg Hx   . Hypotension Neg Hx   . Pseudochol deficiency Neg Hx   . Malignant hyperthermia Neg Hx     Her Social History Is Significant For: Social History   Socioeconomic History  . Marital status: Married    Spouse name: None  . Number of children: None  . Years of education: None  . Highest education level: None  Social Needs  . Financial resource strain: None  . Food insecurity - worry: None  . Food insecurity - inability: None  . Transportation needs - medical: None  . Transportation needs - non-medical: None  Occupational History  . None  Tobacco Use  . Smoking status: Never Smoker  . Smokeless tobacco: Never Used  Substance and Sexual Activity  . Alcohol use: No  .  Drug use: No  . Sexual activity: Yes  Other Topics Concern  . None  Social History Narrative  . None    Her Allergies Are:  Allergies  Allergen Reactions  . Sulfa Antibiotics     Childhood reaction  :   Her Current Medications Are:  Outpatient Encounter Medications as of 07/13/2017  Medication Sig  . cyclobenzaprine (FLEXERIL) 10 MG tablet Take 1/2 or 1 at bedtime as needed for muscle spasm  . FLUoxetine (PROZAC) 20 MG tablet Take 1 tablet (20 mg total) by mouth  daily. May increase to 2 pills in 2 weeks if needed  . ibuprofen (ADVIL,MOTRIN) 200 MG tablet Take 200 mg by mouth every 6 (six) hours as needed.  . Multiple Vitamin (MULTIVITAMIN) tablet Take 1 tablet by mouth daily.  . naproxen sodium (ALEVE) 220 MG tablet Take 220 mg by mouth as needed.  . SUMAtriptan (IMITREX) 100 MG tablet Take 1/2 or 1 at first sign of migraine headache. May repeat in 2 hours if needed.  Max 200 in 24 hours   No facility-administered encounter medications on file as of 07/13/2017.   :  Review of Systems:  Out of a complete 14 point review of systems, all are reviewed and negative with Tricia exception of these symptoms as listed below: Review of Systems  Neurological:       Pt presents today to discuss her sleep. Pt has never had a sleep study but does endorse snoring.  Epworth Sleepiness Scale 0= would never doze 1= slight chance of dozing 2= moderate chance of dozing 3= high chance of dozing  Sitting and reading: 3 Watching TV: 3 Sitting inactive in a public place (ex. Theater or meeting): 1 As a passenger in a car for an hour without a break: 3 Lying down to rest in Tricia afternoon: 2 Sitting and talking to someone: 0 Sitting quietly after lunch (no alcohol): 1 In a car, while stopped in traffic: 1 Total: 14     Objective:  Neurological Exam  Physical Exam Physical Examination:   Vitals:   07/13/17 1336  BP: 140/85  Pulse: 88    General Examination: Tricia Tricia [name] is a very pleasant 41 y.o. female in no acute distress. She appears well-developed and well-nourished and well groomed.   HEENT: Normocephalic, atraumatic, pupils are equal, round and reactive to light and accommodation. Funduscopic exam is normal with sharp disc margins noted. Extraocular tracking is good without limitation to gaze excursion or nystagmus noted. Normal smooth pursuit is noted. Hearing is grossly intact. Tympanic membranes are clear bilaterally. Face is symmetric with normal  facial animation and normal facial sensation. Speech is clear with no dysarthria noted. There is no hypophonia. There is no lip, neck/head, jaw or voice tremor. Neck is supple with full range of passive and active motion. There are no carotid bruits on auscultation. Oropharynx exam reveals: mild mouth dryness, adequate dental hygiene and mild airway crowding, due to larger tonsil on R and normal on Tricia L. Mallampati is class I. Tongue protrudes centrally and palate elevates symmetrically. Tonsils are 3+ on Tricia R and 2+ in size on L. Neck size is 13 7/8 inches. She has a Mild overbite.    Chest: Clear to auscultation without wheezing, rhonchi or crackles noted.  Heart: S1+S2+0, regular and normal without murmurs, rubs or gallops noted.   Abdomen: Soft, non-tender and non-distended with normal bowel sounds appreciated on auscultation.  Extremities: There is no pitting edema in Tricia distal lower extremities bilaterally.  Pedal pulses are intact.  Skin: Warm and dry without trophic changes noted.  Musculoskeletal: exam reveals no obvious joint deformities, tenderness or joint swelling or erythema, mild scoliosis lower thoracic spine. Slight increase in hip height on Tricia right, slight tilt of her body to Tricia left.   Neurologically:  Mental status: Tricia Tricia [name] is awake, alert and oriented in all 4 spheres. Her immediate and remote memory, attention, language skills and fund of knowledge are appropriate. There is no evidence of aphasia, agnosia, apraxia or anomia. Speech is clear with normal prosody and enunciation. Thought process is linear. Mood is normal and affect is normal.  Cranial nerves II - XII are as described above under HEENT exam. In addition: shoulder shrug is normal with equal shoulder height noted. Motor exam: Normal bulk, strength and tone is noted. There is no drift, tremor or rebound. Romberg is negative. Reflexes are 2+ throughout. Fine motor skills and coordination: intact with normal  finger taps, normal hand movements, normal rapid alternating patting, normal foot taps and normal foot agility.  Cerebellar testing: No dysmetria or intention tremor on finger to nose testing. Heel to shin is unremarkable bilaterally. There is no truncal or gait ataxia.  Sensory exam: intact to light touch in Tricia upper and lower extremities.  Gait, station and balance: She stands easily. No veering to one side is noted. No leaning to one side is noted. Posture is age-appropriate and stance is narrow based. Gait shows normal stride length and normal pace. No problems turning are noted. Tandem walk is unremarkable.   Assessment and plan:  In summary, Tricia Tricia [name] is a very pleasant 41 y.o.-year old female with an underlying medical history of scoliosis, allergic rhinitis, history of shingles, hyperlipidemia, and obesity, whose history and physical exam are concerning for obstructive sleep apnea (OSA). I had a long chat with Tricia Tricia [name] about my findings and Tricia diagnosis of OSA, its prognosis and treatment options. We talked about medical treatments, surgical interventions and non-pharmacological approaches. I explained in particular Tricia risks and ramifications of untreated moderate to severe OSA, especially with respect to developing cardiovascular disease down Tricia Road, including congestive heart failure, difficult to treat hypertension, cardiac arrhythmias, or stroke. Even type 2 diabetes has, in part, been linked to untreated OSA. Symptoms of untreated OSA include daytime sleepiness, memory problems, mood irritability and mood disorder such as depression and anxiety, lack of energy, as well as recurrent headaches, especially morning headaches. We talked about trying to maintain a healthy lifestyle in general, as well as Tricia importance of weight control. I encouraged Tricia Tricia [name] to eat healthy, exercise daily and keep well hydrated, to keep a scheduled bedtime and wake time routine, to not skip any meals  and eat healthy snacks in between meals. I advised Tricia Tricia [name] not to drive when feeling sleepy. I recommended Tricia following at this time: sleep study with potential positive airway pressure titration. (We will score hypopneas at 3%).   I explained Tricia sleep test procedure to Tricia Tricia [name] and also outlined possible surgical and non-surgical treatment options of OSA, including Tricia use of a custom-made dental device (which would require a referral to a specialist dentist or oral surgeon), upper airway surgical options, such as pillar implants, radiofrequency surgery, tongue base surgery, and UPPP (which would involve a referral to an ENT surgeon). Rarely, jaw surgery such as mandibular advancement may be considered.  I also explained Tricia CPAP treatment option to Tricia Tricia [name], who indicated that she would be willing  to try CPAP if Tricia need arises. I explained Tricia importance of being compliant with PAP treatment, not only for insurance purposes but primarily to improve Her symptoms, and for Tricia Tricia [name]'s long term health benefit, including to reduce Her cardiovascular risks. I answered all her questions today and Tricia Tricia [name] was in agreement. I would like to see her back after Tricia sleep study is completed and encouraged her to call with any interim questions, concerns, problems or updates.   Thank you very much for allowing me to participate in Tricia care of this nice Tricia [name]. If I can be of any further assistance to you please do not hesitate to call me at [phone].  Sincerely,   [name], MD, [name]

## 2017-08-16 ENCOUNTER — Other Ambulatory Visit: Payer: Self-pay | Admitting: Family Medicine

## 2017-08-16 DIAGNOSIS — S46819A Strain of other muscles, fascia and tendons at shoulder and upper arm level, unspecified arm, initial encounter: Secondary | ICD-10-CM

## 2017-08-16 MED ORDER — CYCLOBENZAPRINE HCL 10 MG PO TABS: ORAL_TABLET | ORAL | 0 refills | Status: DC

## 2017-08-29 ENCOUNTER — Ambulatory Visit (INDEPENDENT_AMBULATORY_CARE_PROVIDER_SITE_OTHER): Payer: 59 | Admitting: Neurology

## 2017-08-29 DIAGNOSIS — G4710 Hypersomnia, unspecified: Secondary | ICD-10-CM | POA: Diagnosis not present

## 2017-08-29 DIAGNOSIS — R519 Headache, unspecified: Secondary | ICD-10-CM

## 2017-08-29 DIAGNOSIS — G4719 Other hypersomnia: Secondary | ICD-10-CM

## 2017-08-29 DIAGNOSIS — R0683 Snoring: Secondary | ICD-10-CM

## 2017-08-29 DIAGNOSIS — E669 Obesity, unspecified: Secondary | ICD-10-CM

## 2017-08-29 DIAGNOSIS — G4720 Circadian rhythm sleep disorder, unspecified type: Secondary | ICD-10-CM

## 2017-08-29 DIAGNOSIS — G478 Other sleep disorders: Secondary | ICD-10-CM

## 2017-08-29 DIAGNOSIS — R51 Headache: Secondary | ICD-10-CM

## 2017-08-31 DIAGNOSIS — Z1231 Encounter for screening mammogram for malignant neoplasm of breast: Secondary | ICD-10-CM | POA: Diagnosis not present

## 2017-08-31 DIAGNOSIS — Z30433 Encounter for removal and reinsertion of intrauterine contraceptive device: Secondary | ICD-10-CM | POA: Diagnosis not present

## 2017-09-02 ENCOUNTER — Other Ambulatory Visit: Payer: Self-pay | Admitting: Certified Nurse Midwife

## 2017-09-02 DIAGNOSIS — L308 Other specified dermatitis: Secondary | ICD-10-CM

## 2017-09-02 DIAGNOSIS — J40 Bronchitis, not specified as acute or chronic: Secondary | ICD-10-CM

## 2017-09-02 MED ORDER — HYDROCORTISONE 2.5 % EX OINT: TOPICAL_OINTMENT | Freq: Two times a day (BID) | CUTANEOUS | 2 refills | Status: DC

## 2017-09-02 MED ORDER — PIMECROLIMUS 1 % EX CREA: TOPICAL_CREAM | Freq: Two times a day (BID) | CUTANEOUS | 2 refills | Status: DC

## 2017-09-02 MED ORDER — BENZONATATE 100 MG PO CAPS: 100.0000 mg | ORAL_CAPSULE | Freq: Three times a day (TID) | ORAL | 0 refills | Status: DC | PRN

## 2017-09-02 MED ORDER — ALBUTEROL SULFATE HFA 108 (90 BASE) MCG/ACT IN AERS: 2.0000 | INHALATION_SPRAY | Freq: Four times a day (QID) | RESPIRATORY_TRACT | 1 refills | Status: DC | PRN

## 2017-09-02 MED ORDER — AMOXICILLIN-POT CLAVULANATE 875-125 MG PO TABS: 1.0000 | ORAL_TABLET | Freq: Two times a day (BID) | ORAL | 0 refills | Status: DC

## 2017-09-03 ENCOUNTER — Encounter: Payer: Self-pay | Admitting: Neurology

## 2017-09-03 NOTE — Procedures (Signed)
PATIENT'S NAME:  Tricia [name], Tricia [name] DOB:      [birth year]      MR#:    [id number]     DATE OF RECORDING: 08/29/2017 REFERRING M.D.:  Lamar Blinks, MD Study Performed:   Baseline Polysomnogram HISTORY: 42 year old woman with a history of scoliosis, allergic rhinitis, history of shingles, hyperlipidemia, and obesity, who reports snoring and excessive daytime somnolence. She has had morning headaches. Her Epworth sleepiness score is 14 out of 24, fatigue score is 39 out of 63. The patient endorsed the Epworth Sleepiness Scale at 14 points. The patient's weight 183 pounds with a height of 61 (inches), resulting in a BMI of 34.5 kg/m2. The patient's neck circumference measured 14 inches.  CURRENT MEDICATIONS: Flexiril, Prozac, Advil, Multuivitamin, Aleve, Imitrex,   PROCEDURE:  This is a multichannel digital polysomnogram utilizing the Somnostar 11.2 system.  Electrodes and sensors were applied and monitored per AASM Specifications.   EEG, EOG, Chin and Limb EMG, were sampled at 200 Hz.  ECG, Snore and Nasal Pressure, Thermal Airflow, Respiratory Effort, CPAP Flow and Pressure, Oximetry was sampled at 50 Hz. Digital video and audio were recorded.      BASELINE STUDY  Lights Out was at 22:22 and Lights On at 05:01.  Total recording time (TRT) was 399.5 minutes, with a total sleep time (TST) of  313.5 minutes.   The patient's sleep latency was 54.5 minutes, which is delayed. REM latency was 81.5 minutes, which is normal. The sleep efficiency was 78.5%, which is reduced.     SLEEP ARCHITECTURE: WASO (Wake after sleep onset) was 31.5 minutes with mild sleep fragmentation noted. There were 46.5 minutes in Stage N1, 142 minutes Stage N2, 47.5 minutes Stage N3 and 77.5 minutes in Stage REM.  The percentage of Stage N1 was 14.8%, which is increased, Stage N2 was 45.3%, which is normal, Stage N3 was 15.2%, which is normal, and Stage R (REM sleep) was 24.7%, which is normal. The arousals were noted as: 31 were  spontaneous, 0 were associated with PLMs, 4 were associated with respiratory events. Audio and video analysis did not show any abnormal or unusual movements, behaviors, phonations or vocalizations. The patient took 1 bathroom break. Mild to, at times moderate snoring was noted. The EKG was in keeping with normal sinus rhythm (NSR).  RESPIRATORY ANALYSIS:  There were a total of 7 respiratory events:  1 obstructive apneas, 0 central apneas and 0 mixed apneas with a total of 1 apneas and an apnea index (AI) of .2 /hour. There were 6 hypopneas with a hypopnea index of 1.1 /hour. The patient also had 0 respiratory event related arousals (RERAs).      The total APNEA/HYPOPNEA INDEX (AHI) was 1.3/hour and the total RESPIRATORY DISTURBANCE INDEX was 1.3 /hour.  7 events occurred in REM sleep and 0 events in NREM. The REM AHI was 5.4 /hour, versus a non-REM AHI of 0. The patient spent 0 minutes of total sleep time in the supine position and 314 minutes in non-supine.. The supine AHI was 0.0 versus a non-supine AHI of 1.3.  OXYGEN SATURATION & C02:  The Wake baseline 02 saturation was 96%, with the lowest being 89%. Time spent below 89% saturation equaled 0 minutes.  PERIODIC LIMB MOVEMENTS: The patient had a total of 0 Periodic Limb Movements.  The Periodic Limb Movement (PLM) index was 0 and the PLM Arousal index was 0/hour.  Post-study, the patient indicated that sleep was the same as usual.   IMPRESSION:  1. Primary  Snoring 2. Dysfunctions associated with sleep stages or arousal from sleep  RECOMMENDATIONS:  1. This study does not demonstrate any significant obstructive or central sleep disordered breathing, mild to moderate snoring was noted. For disturbing snoring, an oral appliance (through a qualified dentist) can be considered. This study does not support an intrinsic sleep disorder as a cause of the patient's symptoms. Other causes, including circadian rhythm disturbances, an underlying mood  disorder, medication effect and/or an underlying medical problem cannot be ruled out. 2. This study shows sleep fragmentation and abnormal sleep stage percentages; these are nonspecific findings and per se do not signify an intrinsic sleep disorder or a cause for the patient's sleep-related symptoms. Causes include (but are not limited to) the first night effect of the sleep study, circadian rhythm disturbances, medication effect or an underlying mood disorder or medical problem.  3. The patient should be cautioned not to drive, work at heights, or operate dangerous or heavy equipment when tired or sleepy. Review and reiteration of good sleep hygiene measures should be pursued with any patient. 4. The patient can follow-up with her referring provider, who will be notified of the test results.   I certify that I have reviewed the entire raw data recording prior to the issuance of this report in accordance with the Standards of Accreditation of the American Academy of Sleep Medicine (AASM)   [name], MD, [name] Diplomat, American Board of Psychiatry and Neurology (Neurology and Sleep Medicine)

## 2017-09-03 NOTE — Progress Notes (Signed)
Patient referred by Dr. Lorelei Pont, seen by me on 07/13/17, diagnostic PSG on 08/29/17.   Please call and notify the patient that the recent sleep study did not show any significant obstructive sleep apnea. Mild to moderate snoring was noted. For disturbing snoring, an oral appliance (through a qualified dentist) can be considered.  She had some difficulty falling asleep, if this happens at home, some Melatonin may help: 1 mg to 3 mg, one to 2 hours before bedtime.  Please remind patient to try to maintain good sleep hygiene, which means: Keep a regular sleep and wake schedule and make enough time for sleep (7 1/2 to 8 1/2 hours for the average adult), try not to exercise or have a meal within 2 hours of your bedtime, try to keep your bedroom conducive for sleep, that is, cool and dark, without light distractors such as an illuminated alarm clock, and refrain from watching TV right before sleep or in the middle of the night and do not keep the TV or radio on during the night. If a nightlight is used, have it away from the visual field. Also, try not to use or play on electronic devices at bedtime, such as your cell phone, tablet PC or laptop. If you like to read at bedtime on an electronic device, try to dim the background light as much as possible. Do not eat in the middle of the night. Keep pets away from the bedroom environment. For stress relief, try meditation, deep breathing exercises (there are many books and CDs available), a white noise machine or fan can help to diffuse other noise distractors, such as traffic noise. Do not drink alcohol before bedtime, as it can disturb sleep and cause middle of the night awakenings. Never mix alcohol and sedating medications! Avoid narcotic pain medication close to bedtime, as opioids/narcotics can suppress breathing drive and breathing effort.   Please inform patient that she can FU with PCP.    Thanks,  [name], MD, [name] [facility])

## 2017-09-06 ENCOUNTER — Telehealth: Payer: Self-pay

## 2017-09-06 NOTE — Telephone Encounter (Signed)
I called pt. I advised pt that Dr. Rexene Alberts reviewed pt's sleep study and found that pt did not show any significant osa. Mild to moderate snoring was noted and I advised pt to discuss her snoring with a qualified dentist if it is bothersome to her. Dr. Rexene Alberts recommends that pt try melatonin 1mg  to 3mg  one to 2 hours before bedtime. Dr. Rexene Alberts also recommends that pt follow up with her PCP. I reviewed sleep hygiene recommendations with the pt, including trying to keep a regular sleep wake schedule, avoiding electronics in the bedroom, keeping the bedroom cool, dark, and quiet, and avoiding eating or exercising within 2 hours of bedtime as well as eating in the middle of the night. I advised pt to keep pets out of the bedroom. I discussed with pt the importance of stress relief and to try meditation, deep breathing exercises, and/or a white noise machine or fan to diffuse other noise distracters. I advised pt to not drink alcohol before bedtime and to never mix alcohol and sedating medications. Pt was advised to avoid narcotic pain medication close to bedtime. I advised pt that a copy of these sleep study results will be sent to Dr. Lorelei Pont. Pt verbalized understanding of results. Pt had no questions at this time but was encouraged to call back if questions arise.

## 2017-09-06 NOTE — Telephone Encounter (Signed)
-----   Message from [name], MD sent at 09/03/2017 11:37 AM EST ----- Patient referred by Dr. Lorelei Pont, seen by me on 07/13/17, diagnostic PSG on 08/29/17.   Please call and notify the patient that the recent sleep study did not show any significant obstructive sleep apnea. Mild to moderate snoring was noted. For disturbing snoring, an oral appliance (through a qualified dentist) can be considered.  She had some difficulty falling asleep, if this happens at home, some Melatonin may help: 1 mg to 3 mg, one to 2 hours before bedtime.  Please remind patient to try to maintain good sleep hygiene, which means: Keep a regular sleep and wake schedule and make enough time for sleep (7 1/2 to 8 1/2 hours for the average adult), try not to exercise or have a meal within 2 hours of your bedtime, try to keep your bedroom conducive for sleep, that is, cool and dark, without light distractors such as an illuminated alarm clock, and refrain from watching TV right before sleep or in the middle of the night and do not keep the TV or radio on during the night. If a nightlight is used, have it away from the visual field. Also, try not to use or play on electronic devices at bedtime, such as your cell phone, tablet PC or laptop. If you like to read at bedtime on an electronic device, try to dim the background light as much as possible. Do not eat in the middle of the night. Keep pets away from the bedroom environment. For stress relief, try meditation, deep breathing exercises (there are many books and CDs available), a white noise machine or fan can help to diffuse other noise distractors, such as traffic noise. Do not drink alcohol before bedtime, as it can disturb sleep and cause middle of the night awakenings. Never mix alcohol and sedating medications! Avoid narcotic pain medication close to bedtime, as opioids/narcotics can suppress breathing drive and breathing effort.   Please inform patient that she can FU with PCP.     Thanks,  [name], MD, [name] [facility])

## 2017-09-10 ENCOUNTER — Encounter: Payer: Self-pay | Admitting: Family Medicine

## 2017-09-10 MED FILL — CYCLOBENZAPRINE HCL 10 MG T: 10 | 30 days supply | Qty: 30 | Fill #0

## 2017-09-23 ENCOUNTER — Encounter: Payer: Self-pay | Admitting: Family Medicine

## 2017-09-27 ENCOUNTER — Encounter: Payer: Self-pay | Admitting: Family Medicine

## 2017-09-27 ENCOUNTER — Ambulatory Visit: Payer: 59 | Admitting: Family Medicine

## 2017-09-27 DIAGNOSIS — S92501K Displaced unspecified fracture of right lesser toe(s), subsequent encounter for fracture with nonunion: Secondary | ICD-10-CM

## 2017-09-27 DIAGNOSIS — M722 Plantar fascial fibromatosis: Secondary | ICD-10-CM | POA: Diagnosis not present

## 2017-09-27 NOTE — Patient Instructions (Signed)
You have plantar fasciitis Take tylenol and/or aleve as needed for pain  Plantar fascia stretch for 20-30 seconds (do 3 of these) in morning Lowering/raise on a step exercises 3 x 10 once or twice a day - this is very important for long term recovery. Can add heel walks, toe walks forward and backward as well Ice heel for 15 minutes as needed. Avoid flat shoes/barefoot walking as much as possible. Arch straps have been shown to help with pain. Inserts are important (dr. scholls active series, spencos, our green insoles, custom orthotics). Steroid injection is a consideration for short term pain relief if you are struggling. Physical therapy is also an option. Follow up with me in 6 weeks.  

## 2017-09-28 ENCOUNTER — Encounter: Payer: Self-pay | Admitting: Family Medicine

## 2017-09-28 DIAGNOSIS — N939 Abnormal uterine and vaginal bleeding, unspecified: Secondary | ICD-10-CM | POA: Diagnosis not present

## 2017-09-28 DIAGNOSIS — M722 Plantar fascial fibromatosis: Secondary | ICD-10-CM | POA: Insufficient documentation

## 2017-09-28 NOTE — Assessment & Plan Note (Signed)
reviewed home exercises and stretches to do daily.  Icing.  Arch binders, encouraged arch supports and different ones she could try that she may like.  Consider physical therapy, night splints, injection.  Tylenol, aleve if needed.  F/u in 6 weeks.

## 2017-09-28 NOTE — Assessment & Plan Note (Signed)
Right 3rd digit proximal phalanx fracture - she continues to have achiness with sharp edges seen where her fracture is.  Offered x-rays, discussed percutaneous pinning, ORIF if this confirms nonunion - she declined for now as not bothering her enough to want to do anything about this.

## 2017-09-28 NOTE — Progress Notes (Signed)
PCP: Copland, Gay Filler, MD  Subjective:   HPI: Patient is a 42 y.o. female here for right heel pain.  Patient reports for a few months now she's had plantar right heel pain. Pain worse by end of work shift. Pain currently at 3/10 level. Has tried some stretching only which helps some. Not using inserts - can never find ones that feel right. She also reports achiness of her 3rd toe which she broke previously, swelling. No skin changes, numbness  Past Medical History:  Diagnosis Date  . Allergic rhinitis   . History of chicken pox   . History of shingles   . Hyperlipidemia     Current Outpatient Medications on File Prior to Visit  Medication Sig Dispense Refill  . albuterol (PROVENTIL HFA;VENTOLIN HFA) 108 (90 Base) MCG/ACT inhaler Inhale 2 puffs into the lungs every 6 (six) hours as needed for wheezing or shortness of breath. 18 g 1  . amoxicillin-clavulanate (AUGMENTIN) 875-125 MG tablet Take 1 tablet by mouth 2 (two) times daily. 14 tablet 0  . benzonatate (TESSALON PERLES) 100 MG capsule Take 1 capsule (100 mg total) by mouth 3 (three) times daily as needed for cough. 30 capsule 0  . cyclobenzaprine (FLEXERIL) 10 MG tablet Take 1/2 or 1 at bedtime as needed for muscle spasm 30 tablet 0  . FLUoxetine (PROZAC) 20 MG tablet Take 1 tablet (20 mg total) by mouth daily. May increase to 2 pills in 2 weeks if needed 60 tablet 6  . hydrocortisone 2.5 % ointment Apply topically 2 (two) times daily. 30 g 2  . ibuprofen (ADVIL,MOTRIN) 200 MG tablet Take 200 mg by mouth every 6 (six) hours as needed.    . Multiple Vitamin (MULTIVITAMIN) tablet Take 1 tablet by mouth daily.    . naproxen sodium (ALEVE) 220 MG tablet Take 220 mg by mouth as needed.    . pimecrolimus (ELIDEL) 1 % cream Apply topically 2 (two) times daily. 30 g 2  . SUMAtriptan (IMITREX) 100 MG tablet Take 1/2 or 1 at first sign of migraine headache. May repeat in 2 hours if needed.  Max 200 in 24 hours 5 tablet 0   No current  facility-administered medications on file prior to visit.     Past Surgical History:  Procedure Laterality Date  . CHOLECYSTECTOMY    . HERNIA REPAIR    . wisdome teeth      Allergies  Allergen Reactions  . Sulfa Antibiotics     Childhood reaction    Social History   Socioeconomic History  . Marital status: Married    Spouse name: Not on file  . Number of children: Not on file  . Years of education: Not on file  . Highest education level: Not on file  Social Needs  . Financial resource strain: Not on file  . Food insecurity - worry: Not on file  . Food insecurity - inability: Not on file  . Transportation needs - medical: Not on file  . Transportation needs - non-medical: Not on file  Occupational History  . Not on file  Tobacco Use  . Smoking status: Never Smoker  . Smokeless tobacco: Never Used  Substance and Sexual Activity  . Alcohol use: No  . Drug use: No  . Sexual activity: Yes  Other Topics Concern  . Not on file  Social History Narrative  . Not on file    Family History  Problem Relation Age of Onset  . Hypertension Maternal Grandmother   .  Hypertension Maternal Grandfather   . Hypertension Paternal Grandmother   . Heart disease Paternal Grandmother   . Hypertension Paternal Grandfather   . Heart disease Paternal Grandfather   . Anesthesia problems Neg Hx   . Hypotension Neg Hx   . Pseudochol deficiency Neg Hx   . Malignant hyperthermia Neg Hx     BP 122/83   Pulse 91   Ht 5\' 1"  (1.549 m)   Wt 180 lb (81.6 kg)   BMI 34.01 kg/m   Review of Systems: See HPI above.     Objective:  Physical Exam:  Gen: NAD, comfortable in exam room  Right foot/ankle: Mild swelling of 3rd digit throughout.  No gross deformity, swelling, ecchymoses FROM with 5/5 strength all directions TTP plantar calcaneus at PF insertion.  Mild TTP base 3rd proximal phalanx. Negative ant drawer and talar tilt.   Negative syndesmotic compression. Negative calcaneal  squeeze. Thompsons test negative. NV intact distally.  Left foot/ankle: No deformity. FROM with 5/5 strength. No tenderness to palpation. NVI distally.   MSK u/s right 3rd digit:  Callus formation noted but can still see sharp edges of fracture base proximal phalanx.  Assessment & Plan:  1. Right plantar fasciitis - reviewed home exercises and stretches to do daily.  Icing.  Arch binders, encouraged arch supports and different ones she could try that she may like.  Consider physical therapy, night splints, injection.  Tylenol, aleve if needed.  F/u in 6 weeks.  2. Right 3rd digit proximal phalanx fracture - she continues to have achiness with sharp edges seen where her fracture is.  Offered x-rays, discussed percutaneous pinning, ORIF if this confirms nonunion - she declined for now as not bothering her enough to want to do anything about this.

## 2017-11-25 DIAGNOSIS — H52223 Regular astigmatism, bilateral: Secondary | ICD-10-CM | POA: Diagnosis not present

## 2018-01-25 ENCOUNTER — Other Ambulatory Visit: Payer: Self-pay | Admitting: Certified Nurse Midwife

## 2018-01-25 DIAGNOSIS — F520 Hypoactive sexual desire disorder: Secondary | ICD-10-CM

## 2018-01-25 MED ORDER — FLIBANSERIN 100 MG PO TABS: 1.0000 | ORAL_TABLET | Freq: Every day | ORAL | 12 refills | Status: DC

## 2018-02-09 ENCOUNTER — Other Ambulatory Visit: Payer: Self-pay | Admitting: Certified Nurse Midwife

## 2018-02-09 DIAGNOSIS — F520 Hypoactive sexual desire disorder: Secondary | ICD-10-CM

## 2018-02-09 MED ORDER — BUPROPION HCL ER (XL) 150 MG PO TB24: 150.0000 mg | ORAL_TABLET | Freq: Every day | ORAL | 5 refills | Status: DC

## 2018-02-11 MED FILL — buPROPion HCL ER (XL) 150 M: 150 | 30 days supply | Qty: 30 | Fill #0

## 2018-04-21 ENCOUNTER — Other Ambulatory Visit: Payer: Self-pay | Admitting: Obstetrics

## 2018-04-21 DIAGNOSIS — F520 Hypoactive sexual desire disorder: Secondary | ICD-10-CM

## 2018-04-21 MED ORDER — FLIBANSERIN 100 MG PO TABS: 1.0000 | ORAL_TABLET | Freq: Every day | ORAL | 12 refills | Status: DC

## 2018-07-23 NOTE — Progress Notes (Addendum)
[facility] [address] [place], [place] [address] [phone]  Date:  07/25/2018   Name:  Tricia [name]   DOB:  04/01/1976   MRN:  [id number]  PCP:  [name], MD    Chief Complaint: Annual Exam   History of Present Illness:  Tricia [name] is a 42 y.o. very pleasant female patient who presents with the following:  Generally healthy woman here today for a physical and labs. She also wishes to discuss her anxiety. Most recent labs done about a year ago.  Can update today  From our CPE a year ago: Since her last visit her headaches have gotten a bit better, she is doing some massage which does help with her head and neck pains She has been a bit concerned about her BP- it was higher than normal over the last couple of months.  However today it looks quite good- she will continue to monitor this on occasion There is a strong family history of HTN She did not have hypertension in pregnancy Her children are 5, 11 and 13.  The oldest is a teen and there is some stress in the family  She admtis that she is feeling anxious a lot of the time.  Not so much depressed, but tends to worry a lot about her family and kids She is interested in trying a medication for anxiety, but notes that she often forgets to take medication every day Finally, she notes that when she gets home after work she still feels tired and worn out. She often feels like her sleep is non- restorative.  Her husband has noted that she snores //////////////// We referred her to neurology for sleep study, and started Prozac for anxiety at that time.  She had her sleep study in January of this year, and it did not show sleep apnea. She is also taking medication for hypoactive sexual desire disorder per her GYN- Addyi She notes that this really made her tired   Flu shot: done  Mammo: done in January of 2019 Pap: per her GYN  Labs: update today ,  she is fasting today  Her kids are now 69, 12 and 14 Things are very busy with sports We tried prozac but she had diarrhea with this and had to stop it.   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  She used xanax in the past, she is not sure of the dose   Reviewed her [place]- nothing recent   She had to get an MMR for her work recently  Today is Monday.  This last Thursday she noted outbreak of a rash on her chest and back.  It consists of a flat, somewhat scaly erythematous skin lesions.  They are smaller than a nickel.  Otherwise she feels fine, though she did recently have a cold last week. Patient Active Problem List   Diagnosis Date Noted  . Plantar fasciitis of right foot 09/28/2017  . GAD (generalized anxiety disorder) 05/19/2017  . Closed fracture of third toe of right foot, with nonunion, subsequent encounter 04/19/2017  . Postpartum care following vaginal delivery (11/4) 06/15/2011    Past Medical History:  Diagnosis Date  . Allergic rhinitis   . History of chicken pox   . History of shingles   .  Hyperlipidemia     Past Surgical History:  Procedure Laterality Date  . CHOLECYSTECTOMY    . HERNIA REPAIR    . wisdome teeth      Social History   Tobacco Use  . Smoking status: Never Smoker  . Smokeless tobacco: Never Used  Substance Use Topics  . Alcohol use: No  . Drug use: No    Family History  Problem Relation Age of Onset  . Hypertension Maternal Grandmother   . Hypertension Maternal Grandfather   . Hypertension Paternal Grandmother   . Heart disease Paternal Grandmother   . Hypertension Paternal Grandfather   . Heart disease Paternal Grandfather   . Anesthesia problems Neg Hx   . Hypotension Neg Hx   . Pseudochol deficiency Neg Hx   . Malignant hyperthermia Neg Hx     Allergies  Allergen Reactions  . Sulfa Antibiotics      Childhood reaction    Medication list has been reviewed and updated.  Current Outpatient Medications on File Prior to Visit  Medication Sig Dispense Refill  . cyclobenzaprine (FLEXERIL) 10 MG tablet Take 1/2 or 1 at bedtime as needed for muscle spasm 30 tablet 0  . hydrocortisone 2.5 % ointment Apply topically 2 (two) times daily. 30 g 2  . ibuprofen (ADVIL,MOTRIN) 200 MG tablet Take 200 mg by mouth every 6 (six) hours as needed.    . Multiple Vitamin (MULTIVITAMIN) tablet Take 1 tablet by mouth daily.    . naproxen sodium (ALEVE) 220 MG tablet Take 220 mg by mouth as needed.     No current facility-administered medications on file prior to visit.     Review of Systems:  As per HPI- otherwise negative.   Physical Examination: Vitals:   07/25/18 0915  BP: 126/72  Pulse: 85  Resp: 16  Temp: 98.2 F (36.8 C)  SpO2: 97%   Vitals:   07/25/18 0915  Weight: 182 lb (82.6 kg)  Height: '5\' 1"'  (1.549 m)   Body mass index is 34.39 kg/m. Ideal Body Weight: Weight in (lb) to have BMI = 25: 132  GEN: WDWN, NAD, Non-toxic, A & O x 3, obese, looks well    HEENT: Atraumatic, Normocephalic. Neck supple. No masses, No LAD.  Bilateral TM wnl, oropharynx normal.  PEERL,EOMI.   Ears and Nose: No external deformity. CV: RRR, No M/G/R. No JVD. No thrill. No extra heart sounds. PULM: CTA B, no wheezes, crackles, rhonchi. No retractions. No resp. distress. No accessory muscle use. ABD: S, NT, ND, +BS. No rebound. No HSM. EXTR: No c/c/e NEURO Normal gait.  PSYCH: Normally interactive. Conversant. Not depressed or anxious appearing.  Calm demeanor.    Assessment and Plan: Physical exam  Fatigue, unspecified type - Plan: CBC, TSH  GAD (generalized anxiety disorder) - Plan: venlafaxine XR (EFFEXOR-XR) 75 MG 24 hr capsule, ALPRAZolam (XANAX) 0.25 MG tablet  Screening for deficiency anemia - Plan: CBC  Screening for diabetes mellitus - Plan: Comprehensive metabolic panel, Hemoglobin  A1c  Screening for hyperlipidemia - Plan: Lipid panel  Skin rash - Plan: Antistreptolysin O titer   Physical exam today. We can touch of her labs ASAP. She likely has pityriasis rosea on her trunk.  However she also reports recent cold-like symptoms, so we will also check an ASO titer for today. She is bothered by anxiety.  This is fairly persistent, but she also does have some attacks of more acute anxiety. We will have her try Effexor XR 75 mg.  Asked her  to give me an update in about 1 month with how this is working for her.  Also gave her a prescription for Xanax to use as needed for acute anxiety.  Cautioned her about tolerance, habit formation, and sedation. She is to use this as little as possible   Signed Lamar Blinks, MD  Received her labs, message to pt  Results for orders placed or performed in visit on 07/25/18  CBC  Result Value Ref Range   WBC 8.3 4.0 - 10.5 K/uL   RBC 4.61 3.87 - 5.11 Mil/uL   Platelets 325.0 150.0 - 400.0 K/uL   Hemoglobin 15.1 (H) 12.0 - 15.0 g/dL   HCT 44.3 36.0 - 46.0 %   MCV 96.1 78.0 - 100.0 fl   MCHC 34.2 30.0 - 36.0 g/dL   RDW 13.0 11.5 - 15.5 %  Comprehensive metabolic panel  Result Value Ref Range   Sodium 138 135 - 145 mEq/L   Potassium 4.1 3.5 - 5.1 mEq/L   Chloride 102 96 - 112 mEq/L   CO2 26 19 - 32 mEq/L   Glucose, Bld 104 (H) 70 - 99 mg/dL   BUN 12 6 - 23 mg/dL   Creatinine, Ser 0.71 0.40 - 1.20 mg/dL   Total Bilirubin 0.8 0.2 - 1.2 mg/dL   Alkaline Phosphatase 75 39 - 117 U/L   AST 17 0 - 37 U/L   ALT 17 0 - 35 U/L   Total Protein 7.2 6.0 - 8.3 g/dL   Albumin 4.2 3.5 - 5.2 g/dL   Calcium 9.6 8.4 - 10.5 mg/dL   GFR 96.33 >60.00 mL/min  Hemoglobin A1c  Result Value Ref Range   Hgb A1c MFr Bld 5.7 4.6 - 6.5 %  Lipid panel  Result Value Ref Range   Cholesterol 213 (H) 0 - 200 mg/dL   Triglycerides 165.0 (H) 0.0 - 149.0 mg/dL   HDL 49.60 >39.00 mg/dL   VLDL 33.0 0.0 - 40.0 mg/dL   LDL Cholesterol 130 (H) 0 - 99  mg/dL   Total CHOL/HDL Ratio 4    NonHDL 163.48   TSH  Result Value Ref Range   TSH 0.90 0.35 - 4.50 uIU/mL   The 10-year ASCVD risk score Mikey Bussing DC Jr., et al., 2013) is: 0.8%   Values used to calculate the score:     Age: 89 years     Sex: Female     Is Non-Hispanic African American: No     Diabetic: No     Tobacco smoker: No     Systolic Blood Pressure: 820 mmHg     Is BP treated: No     HDL Cholesterol: 49.6 mg/dL     Total Cholesterol: 213 mg/dL A1c stable  Lipids stable

## 2018-07-25 ENCOUNTER — Ambulatory Visit (INDEPENDENT_AMBULATORY_CARE_PROVIDER_SITE_OTHER): Payer: 59 | Admitting: Family Medicine

## 2018-07-25 ENCOUNTER — Encounter: Payer: Self-pay | Admitting: Family Medicine

## 2018-07-25 VITALS — BP 126/72 | HR 85 | Temp 98.2°F | Resp 16 | Ht 61.0 in | Wt 182.0 lb

## 2018-07-25 DIAGNOSIS — Z13220 Encounter for screening for lipoid disorders: Secondary | ICD-10-CM | POA: Diagnosis not present

## 2018-07-25 DIAGNOSIS — R21 Rash and other nonspecific skin eruption: Secondary | ICD-10-CM | POA: Diagnosis not present

## 2018-07-25 DIAGNOSIS — Z0000 Encounter for general adult medical examination without abnormal findings: Secondary | ICD-10-CM | POA: Diagnosis not present

## 2018-07-25 DIAGNOSIS — R5383 Other fatigue: Secondary | ICD-10-CM | POA: Diagnosis not present

## 2018-07-25 DIAGNOSIS — F411 Generalized anxiety disorder: Secondary | ICD-10-CM

## 2018-07-25 DIAGNOSIS — Z131 Encounter for screening for diabetes mellitus: Secondary | ICD-10-CM

## 2018-07-25 DIAGNOSIS — Z130 Encounter for screening for diseases of the blood and blood-forming organs and certain disorders involving the immune mechanism: Secondary | ICD-10-CM | POA: Diagnosis not present

## 2018-07-25 LAB — COMPREHENSIVE METABOLIC PANEL
ALT: 17 U/L (ref 0–35)
AST: 17 U/L (ref 0–37)
Albumin: 4.2 g/dL (ref 3.5–5.2)
Alkaline Phosphatase: 75 U/L (ref 39–117)
BUN: 12 mg/dL (ref 6–23)
CHLORIDE: 102 meq/L (ref 96–112)
CO2: 26 mEq/L (ref 19–32)
CREATININE: 0.71 mg/dL (ref 0.40–1.20)
Calcium: 9.6 mg/dL (ref 8.4–10.5)
GFR: 96.33 mL/min (ref >60.00)
Glucose, Bld: 104 mg/dL — ABNORMAL HIGH (ref 70–99)
POTASSIUM: 4.1 meq/L (ref 3.5–5.1)
SODIUM: 138 meq/L (ref 135–145)
Total Bilirubin: 0.8 mg/dL (ref 0.2–1.2)
Total Protein: 7.2 g/dL (ref 6.0–8.3)

## 2018-07-25 LAB — CBC
HEMATOCRIT: 44.3 % (ref 36.0–46.0)
Hemoglobin: 15.1 g/dL — ABNORMAL HIGH (ref 12.0–15.0)
MCHC: 34.2 g/dL (ref 30.0–36.0)
MCV: 96.1 fl (ref 78.0–100.0)
Platelets: 325 10*3/uL (ref 150.0–400.0)
RBC: 4.61 Mil/uL (ref 3.87–5.11)
RDW: 13 % (ref 11.5–15.5)
WBC: 8.3 10*3/uL (ref 4.0–10.5)

## 2018-07-25 LAB — LIPID PANEL
Cholesterol: 213 mg/dL — ABNORMAL HIGH (ref 0–200)
HDL: 49.6 mg/dL (ref >39.00)
LDL Cholesterol: 130 mg/dL — ABNORMAL HIGH (ref 0–99)
NonHDL: 163.48
Total CHOL/HDL Ratio: 4
Triglycerides: 165 mg/dL — ABNORMAL HIGH (ref 0.0–149.0)
VLDL: 33 mg/dL (ref 0.0–40.0)

## 2018-07-25 LAB — TSH: TSH: 0.9 u[IU]/mL (ref 0.35–4.50)

## 2018-07-25 LAB — HEMOGLOBIN A1C: Hgb A1c MFr Bld: 5.7 % (ref 4.6–6.5)

## 2018-07-25 MED ORDER — ALPRAZOLAM 0.25 MG PO TABS: 0.2500 mg | ORAL_TABLET | Freq: Two times a day (BID) | ORAL | 0 refills | Status: DC | PRN

## 2018-07-25 MED ORDER — VENLAFAXINE HCL ER 75 MG PO CP24: 75.0000 mg | ORAL_CAPSULE | Freq: Every day | ORAL | 6 refills | Status: DC

## 2018-07-25 MED FILL — VENLAFAXINE HCL ER 75 MG CA: 75 | 30 days supply | Qty: 30 | Fill #0

## 2018-07-25 MED FILL — ALPRAZolam 0.25 MG TABS: 0.25 | 10 days supply | Qty: 20 | Fill #0

## 2018-07-25 NOTE — Patient Instructions (Signed)
Good to see you today as always. I will be in touch with your labs. For stress and anxiety, let us have you start Effexor once a day 75 mg.  We can increase this dose if need be, please let me know how it works for you.  I will also give you Xanax to use on occasion for acute anxiety.  However please remember this medication can make you drowsy, and can be habit-forming.  Use it as infrequently as you can. I think your rash is pityriasis rosea.  This is a harmless rash, sometimes caused by a viral infection.  We do not completely understand what causes pityriasis.  Typically this will resolve within a few weeks, and does not need any particular treatment.  However it is also possible that you have a condition called guttate psoriasis, which is caused by a strep infection.  I will check a blood test called an ASO titer today, to see if it appears you recently had strep.   Health Maintenance, Female Adopting a healthy lifestyle and getting preventive care can go a long way to promote health and wellness. Talk with your health care provider about what schedule of regular examinations is right for you. This is a good chance for you to check in with your provider about disease prevention and staying healthy. In between checkups, there are plenty of things you can do on your own. Experts have done a lot of research about which lifestyle changes and preventive measures are most likely to keep you healthy. Ask your health care provider for more information. Weight and diet Eat a healthy diet  Be sure to include plenty of vegetables, fruits, low-fat dairy products, and lean protein.  Do not eat a lot of foods high in solid fats, added sugars, or salt.  Get regular exercise. This is one of the most important things you can do for your health. ? Most adults should exercise for at least 150 minutes each week. The exercise should increase your heart rate and make you sweat (moderate-intensity  exercise). ? Most adults should also do strengthening exercises at least twice a week. This is in addition to the moderate-intensity exercise.  Maintain a healthy weight  Body mass index (BMI) is a measurement that can be used to identify possible weight problems. It estimates body fat based on height and weight. Your health care provider can help determine your BMI and help you achieve or maintain a healthy weight.  For females 58 years of age and older: ? A BMI below 18.5 is considered underweight. ? A BMI of 18.5 to 24.9 is normal. ? A BMI of 25 to 29.9 is considered overweight. ? A BMI of 30 and above is considered obese.  Watch levels of cholesterol and blood lipids  You should start having your blood tested for lipids and cholesterol at 41 years of age, then have this test every 5 years.  You may need to have your cholesterol levels checked more often if: ? Your lipid or cholesterol levels are high. ? You are older than 41 years of age. ? You are at high risk for heart disease.  Cancer screening Lung Cancer  Lung cancer screening is recommended for adults 94-61 years old who are at high risk for lung cancer because of a history of smoking.  A yearly low-dose CT scan of the lungs is recommended for people who: ? Currently smoke. ? Have quit within the past 15 years. ? Have at least a  30-pack-year history of smoking. A pack year is smoking an average of one pack of cigarettes a day for 1 year.  Yearly screening should continue until it has been 15 years since you quit.  Yearly screening should stop if you develop a health problem that would prevent you from having lung cancer treatment.  Breast Cancer  Practice breast self-awareness. This means understanding how your breasts normally appear and feel.  It also means doing regular breast self-exams. Let your health care provider know about any changes, no matter how small.  If you are in your 20s or 30s, you should have a  clinical breast exam (CBE) by a health care provider every 1-3 years as part of a regular health exam.  If you are 30 or older, have a CBE every year. Also consider having a breast X-ray (mammogram) every year.  If you have a family history of breast cancer, talk to your health care provider about genetic screening.  If you are at high risk for breast cancer, talk to your health care provider about having an MRI and a mammogram every year.  Breast cancer gene (BRCA) assessment is recommended for women who have family members with BRCA-related cancers. BRCA-related cancers include: ? Breast. ? Ovarian. ? Tubal. ? Peritoneal cancers.  Results of the assessment will determine the need for genetic counseling and BRCA1 and BRCA2 testing.  Cervical Cancer Your health care provider may recommend that you be screened regularly for cancer of the pelvic organs (ovaries, uterus, and vagina). This screening involves a pelvic examination, including checking for microscopic changes to the surface of your cervix (Pap test). You may be encouraged to have this screening done every 3 years, beginning at age 77.  For women ages 29-65, health care providers may recommend pelvic exams and Pap testing every 3 years, or they may recommend the Pap and pelvic exam, combined with testing for human papilloma virus (HPV), every 5 years. Some types of HPV increase your risk of cervical cancer. Testing for HPV may also be done on women of any age with unclear Pap test results.  Other health care providers may not recommend any screening for nonpregnant women who are considered low risk for pelvic cancer and who do not have symptoms. Ask your health care provider if a screening pelvic exam is right for you.  If you have had past treatment for cervical cancer or a condition that could lead to cancer, you need Pap tests and screening for cancer for at least 20 years after your treatment. If Pap tests have been discontinued,  your risk factors (such as having a new sexual partner) need to be reassessed to determine if screening should resume. Some women have medical problems that increase the chance of getting cervical cancer. In these cases, your health care provider may recommend more frequent screening and Pap tests.  Colorectal Cancer  This type of cancer can be detected and often prevented.  Routine colorectal cancer screening usually begins at 41 years of age and continues through 41 years of age.  Your health care provider may recommend screening at an earlier age if you have risk factors for colon cancer.  Your health care provider may also recommend using home test kits to check for hidden blood in the stool.  A small camera at the end of a tube can be used to examine your colon directly (sigmoidoscopy or colonoscopy). This is done to check for the earliest forms of colorectal cancer.  Routine screening  usually begins at age 68.  Direct examination of the colon should be repeated every 5-10 years through 41 years of age. However, you may need to be screened more often if early forms of precancerous polyps or small growths are found.  Skin Cancer  Check your skin from head to toe regularly.  Tell your health care provider about any new moles or changes in moles, especially if there is a change in a mole's shape or color.  Also tell your health care provider if you have a mole that is larger than the size of a pencil eraser.  Always use sunscreen. Apply sunscreen liberally and repeatedly throughout the day.  Protect yourself by wearing long sleeves, pants, a wide-brimmed hat, and sunglasses whenever you are outside.  Heart disease, diabetes, and high blood pressure  High blood pressure causes heart disease and increases the risk of stroke. High blood pressure is more likely to develop in: ? People who have blood pressure in the high end of the normal range (130-139/85-89 mm Hg). ? People who are  overweight or obese. ? People who are African American.  If you are 61-11 years of age, have your blood pressure checked every 3-5 years. If you are 28 years of age or older, have your blood pressure checked every year. You should have your blood pressure measured twice-once when you are at a hospital or clinic, and once when you are not at a hospital or clinic. Record the average of the two measurements. To check your blood pressure when you are not at a hospital or clinic, you can use: ? An automated blood pressure machine at a pharmacy. ? A home blood pressure monitor.  If you are between 3 years and 36 years old, ask your health care provider if you should take aspirin to prevent strokes.  Have regular diabetes screenings. This involves taking a blood sample to check your fasting blood sugar level. ? If you are at a normal weight and have a low risk for diabetes, have this test once every three years after 41 years of age. ? If you are overweight and have a high risk for diabetes, consider being tested at a younger age or more often. Preventing infection Hepatitis B  If you have a higher risk for hepatitis B, you should be screened for this virus. You are considered at high risk for hepatitis B if: ? You were born in a country where hepatitis B is common. Ask your health care provider which countries are considered high risk. ? Your parents were born in a high-risk country, and you have not been immunized against hepatitis B (hepatitis B vaccine). ? You have HIV or AIDS. ? You use needles to inject street drugs. ? You live with someone who has hepatitis B. ? You have had sex with someone who has hepatitis B. ? You get hemodialysis treatment. ? You take certain medicines for conditions, including cancer, organ transplantation, and autoimmune conditions.  Hepatitis C  Blood testing is recommended for: ? Everyone born from [birth year] through 1964. ? Anyone with known risk factors for  hepatitis C.  Sexually transmitted infections (STIs)  You should be screened for sexually transmitted infections (STIs) including gonorrhea and chlamydia if: ? You are sexually active and are younger than 41 years of age. ? You are older than 41 years of age and your health care provider tells you that you are at risk for this type of infection. ? Your sexual activity has changed  since you were last screened and you are at an increased risk for chlamydia or gonorrhea. Ask your health care provider if you are at risk.  If you do not have HIV, but are at risk, it may be recommended that you take a prescription medicine daily to prevent HIV infection. This is called pre-exposure prophylaxis (PrEP). You are considered at risk if: ? You are sexually active and do not regularly use condoms or know the HIV status of your partner(s). ? You take drugs by injection. ? You are sexually active with a partner who has HIV.  Talk with your health care provider about whether you are at high risk of being infected with HIV. If you choose to begin PrEP, you should first be tested for HIV. You should then be tested every 3 months for as long as you are taking PrEP. Pregnancy  If you are premenopausal and you may become pregnant, ask your health care provider about preconception counseling.  If you may become pregnant, take 400 to 800 micrograms (mcg) of folic acid every day.  If you want to prevent pregnancy, talk to your health care provider about birth control (contraception). Osteoporosis and menopause  Osteoporosis is a disease in which the bones lose minerals and strength with aging. This can result in serious bone fractures. Your risk for osteoporosis can be identified using a bone density scan.  If you are 4 years of age or older, or if you are at risk for osteoporosis and fractures, ask your health care provider if you should be screened.  Ask your health care provider whether you should take a  calcium or vitamin D supplement to lower your risk for osteoporosis.  Menopause may have certain physical symptoms and risks.  Hormone replacement therapy may reduce some of these symptoms and risks. Talk to your health care provider about whether hormone replacement therapy is right for you. Follow these instructions at home:  Schedule regular health, dental, and eye exams.  Stay current with your immunizations.  Do not use any tobacco products including cigarettes, chewing tobacco, or electronic cigarettes.  If you are pregnant, do not drink alcohol.  If you are breastfeeding, limit how much and how often you drink alcohol.  Limit alcohol intake to no more than 1 drink per day for nonpregnant women. One drink equals 12 ounces of beer, 5 ounces of wine, or 1 ounces of hard liquor.  Do not use street drugs.  Do not share needles.  Ask your health care provider for help if you need support or information about quitting drugs.  Tell your health care provider if you often feel depressed.  Tell your health care provider if you have ever been abused or do not feel safe at home. This information is not intended to replace advice given to you by your health care provider. Make sure you discuss any questions you have with your health care provider. Document Released: 02/09/2011 Document Revised: 01/02/2016 Document Reviewed: 04/30/2015 Elsevier Interactive Patient Education  [employer].

## 2018-07-26 LAB — ANTISTREPTOLYSIN O TITER: ASO: 96 IU/mL (ref <200)

## 2018-09-28 ENCOUNTER — Other Ambulatory Visit: Payer: Self-pay | Admitting: Obstetrics

## 2018-09-28 DIAGNOSIS — B3731 Acute candidiasis of vulva and vagina: Secondary | ICD-10-CM

## 2018-09-28 DIAGNOSIS — J069 Acute upper respiratory infection, unspecified: Secondary | ICD-10-CM

## 2018-09-28 DIAGNOSIS — B373 Candidiasis of vulva and vagina: Secondary | ICD-10-CM

## 2018-09-28 MED ORDER — AZITHROMYCIN 250 MG PO TABS: ORAL_TABLET | ORAL | 1 refills | Status: DC

## 2018-09-28 MED ORDER — AMOXICILLIN-POT CLAVULANATE 875-125 MG PO TABS: 1.0000 | ORAL_TABLET | Freq: Two times a day (BID) | ORAL | 1 refills | Status: DC

## 2018-09-28 MED ORDER — FLUCONAZOLE 150 MG PO TABS: 150.0000 mg | ORAL_TABLET | Freq: Once | ORAL | 2 refills | Status: AC

## 2018-09-28 MED FILL — FLUCONAZOLE 150 MG TABS: 150 | 1 days supply | Qty: 1 | Fill #0

## 2018-09-28 MED FILL — AMOX-CLAV 875-125 MG TABLET: 875-125 | 7 days supply | Qty: 14 | Fill #0

## 2018-09-28 MED FILL — AZITHROMYCIN 250 MG TABLET: 250 | 5 days supply | Qty: 6 | Fill #0

## 2018-10-18 MED FILL — VENLAFAXINE HCL ER 75 MG CA: 75 | 30 days supply | Qty: 30 | Fill #1

## 2018-11-21 ENCOUNTER — Emergency Department (HOSPITAL_BASED_OUTPATIENT_CLINIC_OR_DEPARTMENT_OTHER)
Admission: EM | Admit: 2018-11-21 | Discharge: 2018-11-21 | Disposition: A | Discharge status: Home / Self Care | Payer: 59 | Attending: Emergency Medicine | Admitting: Emergency Medicine

## 2018-11-21 ENCOUNTER — Ambulatory Visit: Payer: Self-pay | Admitting: *Deleted

## 2018-11-21 ENCOUNTER — Encounter (HOSPITAL_BASED_OUTPATIENT_CLINIC_OR_DEPARTMENT_OTHER): Payer: Self-pay

## 2018-11-21 ENCOUNTER — Emergency Department (HOSPITAL_BASED_OUTPATIENT_CLINIC_OR_DEPARTMENT_OTHER): Payer: 59

## 2018-11-21 ENCOUNTER — Other Ambulatory Visit: Payer: Self-pay

## 2018-11-21 DIAGNOSIS — Z79899 Other long term (current) drug therapy: Secondary | ICD-10-CM | POA: Insufficient documentation

## 2018-11-21 DIAGNOSIS — Y9289 Other specified places as the place of occurrence of the external cause: Secondary | ICD-10-CM | POA: Insufficient documentation

## 2018-11-21 DIAGNOSIS — Y998 Other external cause status: Secondary | ICD-10-CM | POA: Diagnosis not present

## 2018-11-21 DIAGNOSIS — S62316A Displaced fracture of base of fifth metacarpal bone, right hand, initial encounter for closed fracture: Secondary | ICD-10-CM | POA: Insufficient documentation

## 2018-11-21 DIAGNOSIS — R55 Syncope and collapse: Secondary | ICD-10-CM | POA: Diagnosis not present

## 2018-11-21 DIAGNOSIS — R0789 Other chest pain: Secondary | ICD-10-CM | POA: Diagnosis not present

## 2018-11-21 DIAGNOSIS — Y9301 Activity, walking, marching and hiking: Secondary | ICD-10-CM | POA: Insufficient documentation

## 2018-11-21 DIAGNOSIS — W010XXA Fall on same level from slipping, tripping and stumbling without subsequent striking against object, initial encounter: Secondary | ICD-10-CM | POA: Diagnosis not present

## 2018-11-21 DIAGNOSIS — R079 Chest pain, unspecified: Secondary | ICD-10-CM | POA: Diagnosis not present

## 2018-11-21 LAB — URINALYSIS, MICROSCOPIC (REFLEX)

## 2018-11-21 LAB — URINALYSIS, ROUTINE W REFLEX MICROSCOPIC
Bilirubin Urine: NEGATIVE
Glucose, UA: NEGATIVE mg/dL
Hgb urine dipstick: NEGATIVE
Ketones, ur: NEGATIVE mg/dL
Nitrite: NEGATIVE
Protein, ur: NEGATIVE mg/dL
Specific Gravity, Urine: 1.01 (ref 1.005–1.030)
pH: 6 (ref 5.0–8.0)

## 2018-11-21 LAB — CBC
HCT: 46.6 % — ABNORMAL HIGH (ref 36.0–46.0)
Hemoglobin: 15.3 g/dL — ABNORMAL HIGH (ref 12.0–15.0)
MCH: 31.8 pg (ref 26.0–34.0)
MCHC: 32.8 g/dL (ref 30.0–36.0)
MCV: 96.9 fL (ref 80.0–100.0)
Platelets: 320 10*3/uL (ref 150–400)
RBC: 4.81 MIL/uL (ref 3.87–5.11)
RDW: 12.4 % (ref 11.5–15.5)
WBC: 14.1 10*3/uL — ABNORMAL HIGH (ref 4.0–10.5)
nRBC: 0 % (ref 0.0–0.2)

## 2018-11-21 LAB — COMPREHENSIVE METABOLIC PANEL
ALT: 19 U/L (ref 0–44)
AST: 24 U/L (ref 15–41)
Albumin: 4.3 g/dL (ref 3.5–5.0)
Alkaline Phosphatase: 79 U/L (ref 38–126)
Anion gap: 9 (ref 5–15)
BUN: 15 mg/dL (ref 6–20)
CO2: 22 mmol/L (ref 22–32)
Calcium: 9.4 mg/dL (ref 8.9–10.3)
Chloride: 102 mmol/L (ref 98–111)
Creatinine, Ser: 0.76 mg/dL (ref 0.44–1.00)
GFR calc Af Amer: >60 mL/min (ref >60)
GFR calc non Af Amer: >60 mL/min (ref >60)
Glucose, Bld: 114 mg/dL — ABNORMAL HIGH (ref 70–99)
Potassium: 3.6 mmol/L (ref 3.5–5.1)
Sodium: 133 mmol/L — ABNORMAL LOW (ref 135–145)
Total Bilirubin: 0.4 mg/dL (ref 0.3–1.2)
Total Protein: 8.3 g/dL — ABNORMAL HIGH (ref 6.5–8.1)

## 2018-11-21 LAB — TROPONIN I: Troponin I: <0.03 ng/mL (ref <0.03)

## 2018-11-21 LAB — PREGNANCY, URINE: Preg Test, Ur: NEGATIVE

## 2018-11-21 MED ORDER — OXYCODONE-ACETAMINOPHEN 5-325 MG PO TABS: 1.0000 | ORAL_TABLET | Freq: Four times a day (QID) | ORAL | 0 refills | Status: DC | PRN

## 2018-11-21 MED ORDER — KETOROLAC TROMETHAMINE 15 MG/ML IJ SOLN
15.0000 mg | Freq: Once | INTRAMUSCULAR | Status: AC
  Administered 2018-11-21: 15 mg via INTRAVENOUS
  Filled 2018-11-21: qty 1

## 2018-11-21 MED ORDER — SODIUM CHLORIDE 0.9 % IV BOLUS
1000.0000 mL | Freq: Once | INTRAVENOUS | Status: AC
  Administered 2018-11-21: 1000 mL via INTRAVENOUS

## 2018-11-21 NOTE — ED Triage Notes (Addendum)
Pt states she was walking outside ~230pm-she passed out and hit pavement-denies head/neck pain-pain to right hand/wrist-NAD-steady gait

## 2018-11-21 NOTE — Discharge Instructions (Signed)
You were seen in the emergency department today for possibly passing out.  Your work-up in the ER was overall fairly reassuring.  Your EKG and heart enzymes did not show a heart attack.  Your chest x-ray was normal.  Your labs did not show significant abnormalities, your white blood cell count was somewhat high at 14, this should be rechecked by primary care.  Your sodium was a bit low at 133, you were given normal saline fluids in the emergency department.  We would like you to follow-up with your primary care provider or with a heart doctor for reevaluation and further discussion of the event that occurred today.  These be aware that passing out can recur, we recommend avoiding driving, elevated surfaced or potentially dangerous situations.  Please be sure to maintain good hydration.  Regarding your hand injury.  Your x-ray shows that you have a fractured fifth metacarpal.  We have placed you into a splint for this fracture.  Please keep the splint on, clean, and dry until you have followed up with Dr. Barbaraann Barthel, the sports medicine provider upstairs.  Please follow-up within 1 week.  Home care instructions: -- *PRICE in the first 24-48 hours after injury: Protect with splint Rest Ice- Do not apply ice pack directly to your splint place towel or similar between your splint and ice/ice pack. Apply ice for 20 min, then remove for 40 min while awake Compression- Wear brace, elastic bandage, splint as directed by your provider Elevate affected extremity above the level of your heart when not walking around for the first 24-48 hours   Medications:  Take ibuprofen per over-the-counter dosing.  Please take Percocet as needed for severe pain not controlled with ibuprofen. -Percocet-this is a narcotic/controlled substance medication that has potential addicting qualities.  We recommend that you take 1-2 tablets every 6 hours as needed for severe pain.  Do not drive or operate heavy machinery when taking this  medicine as it can be sedating. Do not drink alcohol or take other sedating medications when taking this medicine for safety reasons.  Keep this out of reach of small children.  Please be aware this medicine has Tylenol in it (325 mg/tab) do not exceed the maximum dose of Tylenol in a day per over the counter recommendations should you decide to supplement with Tylenol over the counter.   We have prescribed you new medication(s) today. Discuss the medications prescribed today with your pharmacist as they can have adverse effects and interactions with your other medicines including over the counter and prescribed medications. Seek medical evaluation if you start to experience new or abnormal symptoms after taking one of these medicines, seek care immediately if you start to experience difficulty breathing, feeling of your throat closing, facial swelling, or rash as these could be indications of a more serious allergic reaction   Return instructions:  Please return if you pass out again, you have return of chest pain, trouble breathing, numbness, weakness, dizziness, inability to keep fluids down, or seizure activity. Please return if your digits or extremity are numb or tingling, appear gray or blue, or you have severe pain (also elevate the extremity and loosen splint or wrap if you were given one) Please return if you have redness or fevers.  Please return to the Emergency Department if you experience worsening symptoms.  Please return if you have any other emergent concerns. Additional Information:  Your vital signs today were: BP 131/79    Pulse 77    Temp  98.5 F (36.9 C) (Oral)    Resp 17    Ht 5' (1.524 m)    Wt 83.3 kg    SpO2 100%    BMI 35.87 kg/m  If your blood pressure (BP) was elevated above 135/85 this visit, please have this repeated by your doctor within one month. ---------------

## 2018-11-21 NOTE — ED Provider Notes (Signed)
[facility] EMERGENCY DEPARTMENT Provider Note   CSN: [id number] Arrival date & time: 11/21/18  1717    History   Chief Complaint Chief Complaint  Patient presents with  . Near Syncope    HPI Tricia [name] is a 43 y.o. female with a hx of hyperlipidemia, anxiety, and prior cholecystectomy who presents to the ED for near syncope vs. Syncope which occurred at 14:30 this afternoon with R hand injury. Patient states she had been feeling poorly throughout the weekend with increased anxiety & some nausea & diarrhea, states she had been doing a lot of work around the house. Today she was walking through her neighborhood with her children when she started to feel very hot/sweaty and then was "falling." She does not believe she was feeling lightheaded/dizzy prior to. She did not trip. She fell forward and thinks she fainted. Her children said she did not lose consciousness. When she came back to she felt her chest was tight w/ dyspnea & nausea w/ some anxiousness. This lasted a short period of time prior to resolving & has not re-occurred, sxs after incident were not present prior to episode. She did fall onto her face & her R hand. Only area of pain currently is R hand, moderate to severe, worse with movement, no alleviating factors. R hand dominant.   Denies seizure activity, emesis, abdominal pain, headache, numbness, weakness, leg pain/swelling, hemoptysis, recent surgery/trauma, recent long travel, hormone use, personal hx of cancer, or hx of DVT/PE. No family hx of sudden cardiac death.      HPI  Past Medical History:  Diagnosis Date  . Allergic rhinitis   . History of chicken pox   . History of shingles   . Hyperlipidemia     Patient Active Problem List   Diagnosis Date Noted  . Plantar fasciitis of right foot 09/28/2017  . GAD (generalized anxiety disorder) 05/19/2017  . Closed fracture of third toe of right foot, with nonunion, subsequent encounter 04/19/2017  .  Postpartum care following vaginal delivery (11/4) 06/15/2011    Past Surgical History:  Procedure Laterality Date  . CHOLECYSTECTOMY    . HERNIA REPAIR    . wisdome teeth       OB History    Gravida  4   Para  3   Term  3   Preterm      AB  1   Living  3     SAB  1   TAB      Ectopic      Multiple      Live Births  3            Home Medications    Prior to Admission medications   Medication Sig Start Date End Date Taking? Authorizing Provider  ALPRAZolam (XANAX) 0.25 MG tablet Take 1 tablet (0.25 mg total) by mouth 2 (two) times daily as needed for anxiety. 07/25/18   Copland, Gay Filler, MD  amoxicillin-clavulanate (AUGMENTIN) 875-125 MG tablet Take 1 tablet by mouth 2 (two) times daily. 09/28/18   Shelly Bombard, MD  azithromycin (ZITHROMAX Z-PAK) 250 MG tablet Take 2 tablets po load, then 1 tablet po daily 09/28/18   Shelly Bombard, MD  cyclobenzaprine (FLEXERIL) 10 MG tablet Take 1/2 or 1 at bedtime as needed for muscle spasm 08/16/17   Copland, Gay Filler, MD  hydrocortisone 2.5 % ointment Apply topically 2 (two) times daily. 09/02/17   Kandis Cocking A, CNM  ibuprofen (ADVIL,MOTRIN) 200 MG  tablet Take 200 mg by mouth every 6 (six) hours as needed.    [provider]  Multiple Vitamin (MULTIVITAMIN) tablet Take 1 tablet by mouth daily.    [provider]  naproxen sodium (ALEVE) 220 MG tablet Take 220 mg by mouth as needed.    [provider]  venlafaxine XR (EFFEXOR-XR) 75 MG 24 hr capsule Take 1 capsule (75 mg total) by mouth daily with breakfast. 07/25/18   Copland, Gay Filler, MD    Family History Family History  Problem Relation Age of Onset  . Hypertension Maternal Grandmother   . Hypertension Maternal Grandfather   . Hypertension Paternal Grandmother   . Heart disease Paternal Grandmother   . Hypertension Paternal Grandfather   . Heart disease Paternal Grandfather   . Anesthesia problems Neg Hx   . Hypotension Neg  Hx   . Pseudochol deficiency Neg Hx   . Malignant hyperthermia Neg Hx     Social History Social History   Tobacco Use  . Smoking status: Never Smoker  . Smokeless tobacco: Never Used  Substance Use Topics  . Alcohol use: Yes    Comment: occ  . Drug use: No     Allergies   Sulfa antibiotics   Review of Systems Review of Systems  Constitutional: Negative for chills and fever.  HENT: Negative for congestion, ear pain and sore throat.   Respiratory: Positive for chest tightness (resolved at present) and shortness of breath (resolved at present). Negative for cough.   Cardiovascular: Negative for leg swelling.  Gastrointestinal: Positive for diarrhea (resolved at present) and nausea (resolved at present). Negative for abdominal pain, anal bleeding, blood in stool, rectal pain and vomiting.  Musculoskeletal: Positive for arthralgias (R hand).  Neurological: Positive for syncope (vs near syncope). Negative for dizziness, tremors, seizures, speech difficulty, weakness, light-headedness, numbness and headaches.  All other systems reviewed and are negative.    Physical Exam Updated Vital Signs BP (!) 146/108 (BP Location: Left Arm)   Pulse 88   Temp 98.5 F (36.9 C) (Oral)   Resp 14   Ht 5' (1.524 m)   Wt 83.3 kg   SpO2 99%   BMI 35.87 kg/m   Physical Exam Vitals signs and nursing note reviewed.  Constitutional:      General: She is not in acute distress.    Appearance: She is well-developed.  HENT:     Head: Normocephalic and atraumatic. No raccoon eyes or Battle's sign.     Right Ear: No hemotympanum.     Left Ear: No hemotympanum.     Mouth/Throat:     Pharynx: Uvula midline.  Eyes:     General:        Right eye: No discharge.        Left eye: No discharge.     Conjunctiva/sclera: Conjunctivae normal.     Pupils: Pupils are equal, round, and reactive to light.  Neck:     Musculoskeletal: Full passive range of motion without pain, normal range of motion and  neck supple. No spinous process tenderness.     Comments: No palpable step off.  Cardiovascular:     Rate and Rhythm: Normal rate and regular rhythm.     Heart sounds: No murmur.  Pulmonary:     Effort: No respiratory distress.     Breath sounds: Normal breath sounds. No wheezing or rales.  Chest:     Chest wall: Tenderness (anterior chest wall without palpable crepitus or obvious deformity) present.  Abdominal:     General: There is no distension.     Palpations: Abdomen is soft.     Tenderness: There is no abdominal tenderness.  Musculoskeletal:     Comments: UEs: Soft tissue swelling and mild bruising to R 4th/5th metacarpal/carpals. Full AROM throughout all joints. Tender to the R 4th/5th metacarpals. No snuffbox tenderness. UEs otherwise nontender. NVI distally.  Back: No midline tenderness/palpable step off.  LEs: Normal AROM throughout. Nontender.   Skin:    General: Skin is warm and dry.     Capillary Refill: Capillary refill takes less than 2 seconds.     Findings: No rash.  Neurological:     Comments: Alert. Clear speech. No facial droop. CNIII-XII grossly intact. Bilateral upper and lower extremities' sensation grossly intact. 5/5 symmetric strength with grip strength and with plantar and dorsi flexion bilaterally . Normal finger to nose bilaterally. Negative pronator drift. Negative Romberg sign. Gait is steady and intact.   Psychiatric:        Behavior: Behavior normal.      ED Treatments / Results  Labs (all labs ordered are listed, but only abnormal results are displayed) Labs Reviewed  CBC - Abnormal; Notable for the following components:      Result Value   WBC 14.1 (*)    Hemoglobin 15.3 (*)    HCT 46.6 (*)    All other components within normal limits  COMPREHENSIVE METABOLIC PANEL - Abnormal; Notable for the following components:   Sodium 133 (*)    Glucose, Bld 114 (*)    Total Protein 8.3 (*)    All other components within normal limits  URINALYSIS,  ROUTINE W REFLEX MICROSCOPIC - Abnormal; Notable for the following components:   Leukocytes,Ua TRACE (*)    All other components within normal limits  URINALYSIS, MICROSCOPIC (REFLEX) - Abnormal; Notable for the following components:   Bacteria, UA FEW (*)    All other components within normal limits  TROPONIN I  PREGNANCY, URINE    EKG EKG Interpretation  Date/Time:  Monday November 21 2018 17:32:00 EDT Ventricular Rate:  86 PR Interval:    QRS Duration: 88 QT Interval:  366 QTC Calculation: 438 R Axis:   68 Text Interpretation:  Sinus rhythm Borderline short PR  Otherwise normal ecg Confirmed by Veryl Speak [phone]) on 11/21/2018 5:35:08 PM   Radiology Dg Chest 2 View  Result Date: 11/21/2018 CLINICAL DATA:  Recent syncopal episode with chest pain EXAM: CHEST - 2 VIEW COMPARISON:  None. FINDINGS: The heart size and mediastinal contours are within normal limits. Both lungs are clear. The visualized skeletal structures are unremarkable. IMPRESSION: No active cardiopulmonary disease. Electronically Signed   By: Inez Catalina M.D.   On: 11/21/2018 19:00   Dg Hand Complete Right  Result Date: 11/21/2018 CLINICAL DATA:  Recent syncopal episode with hand pain, initial encounter EXAM: RIGHT HAND - COMPLETE 3+ VIEW COMPARISON:  None. FINDINGS: There is a an avulsion fracture from the proximal aspect of the fifth metacarpal laterally. No other fracture or soft tissue abnormality is seen. IMPRESSION: Fracture at the base of the fifth metacarpal. Electronically Signed   By: Inez Catalina M.D.   On: 11/21/2018 19:01    Procedures Procedures (including critical care time)  SPLINT APPLICATION Date/Time: 5:32 PM Authorized by: Kennith Maes Consent: Verbal consent obtained. Risks and benefits: risks, benefits and alternatives were discussed Consent given by: patient Splint applied by: ED technician Location details: RUE Splint type: ulnar gutter Post-procedure: The splinted  body part  was neurovascularly unchanged following the procedure. Patient tolerance: Patient tolerated the procedure well with no immediate complications.   Medications Ordered in ED Medications  sodium chloride 0.9 % bolus 1,000 mL ( Intravenous Stopped 11/21/18 1918)  ketorolac (TORADOL) 15 MG/ML injection 15 mg (15 mg Intravenous Given 11/21/18 1938)    Initial Impression / Assessment and Plan / ED Course  I have reviewed the triage vital signs and the nursing notes.  Pertinent labs & imaging results that were available during my care of the patient were reviewed by me and considered in my medical decision making (see chart for details).    Patient presents s/p syncope vs. Near syncope w/ R hand injury. Nontoxic appearing, no apparent distress, vitals WNL with the exception of elevated BP doubt HTN emergency. Exam with some swelling/TTP to R hand & anterior chest wall tenderness otherwise unremarkable. No evidence of serious head/neck/back injury. No headache. No midline tenderness. No neuro deficits. Do not suspect ICH, ischemic CVA, or SAH.  Given temporary chest tightness/dyspnea which is resolved and not reoccurred will obtain cardiac work-up w/ CXR & R hand xray.   Work-up reviewed:  CBC: nonspecific leukocytosis @ 14.1. No anemia.  CMP: Mild hyponatremia @ 133, receiving fluids. No other electrolyte disturbance. Renal function preserved.  UA: Trace leuks w/ few bacteria, not consistent with obvious UTI, culture.  EKG: NSR, borderline short PR.  Troponin: Negative.  CXR: Negative R hand x-ray:  Fracture at the base of the fifth metacarpal. --> NVI distally, ulnar gutter splint, PRICE, perocet for severe pain, & hand follow up- patient would prefer to see Dr. Barbaraann Barthel upstairs which I feel is reasonable.   Work-up re-assuring.  Cardiac monitor without tachycardia or arrhythmias. No personal hx of CAD. Trop negative, EKG without STEMI- doubt ACS. PERC negative doubt PE. No family hx of sudden  cardiac death. Borderline PR, does not appear to have delta wave consistent with WPW. Dyspnea/chest tightness may have been due to impact from fall given tender chest wall, but not definitive, CXR negative, do not feel need for further eval w/ CT at this time w/ clear breath sounds, no crepitus/deformity, low suspicion for significant intra-thoracic injury. Discussed findings & plan of care w/ supervising physician Dr. Stark Jock who has reviewed EKG-  Plan for discharge home with cardiology follow up, given no re-occurrence of sxs and event was > 3 hours PTA does not need delta trop, in agreement. I discussed results, treatment plan, need for follow-up, and return precautions with the patient. Provided opportunity for questions, patient confirmed understanding and is in agreement with plan.    Vitals:   11/21/18 1725 11/21/18 1900  BP: (!) 146/108 131/79  Pulse: 88 77  Resp: 14 17  Temp: 98.5 F (36.9 C)   SpO2: 99% 100%    Final Clinical Impressions(s) / ED Diagnoses   Final diagnoses:  Closed displaced fracture of base of fifth metacarpal bone of right hand, initial encounter  Near syncope    ED Discharge Orders    None       Amaryllis Dyke, PA-C 11/21/18 1941    Veryl Speak, MD 11/21/18 2135

## 2018-11-21 NOTE — Telephone Encounter (Signed)
FYI. Pt headed to ED.  

## 2018-11-21 NOTE — ED Notes (Addendum)
Pt states she was out walking and about passed out  [place] injuring rt hand  Pt a&o

## 2018-11-21 NOTE — Telephone Encounter (Signed)
Patient was outside walking- and she fell- fainted. Hand is swollen. Patient states she was walking with children around the neighborhood and she felt faint and fell. Patient states she could not catch herself and she hurt her hand. Patient states she also hit her cheek and glasses. Patient states her hand is swollen and she can not remove her rings- patient advised ED now. She agrees to go.  Reason for Disposition . [1] SEVERE pain AND [2] not improved 2 hours after pain medicine/ice packs . [1] All other patients AND [2] now alert and feels fine(Exception: SIMPLE FAINT due to stress, pain, prolonged standing, or suddenly standing)  Answer Assessment - Initial Assessment Questions 1. ONSET: "How long were you unconscious?" (minutes) "When did it happen?"    possibly seconds 2. CONTENT: "What happened during period of unconsciousness?" (e.g., seizure activity)      No seizure 3. MENTAL STATUS: "Alert and oriented now?" (oriented x 3 = name, month, location)      alert 4. TRIGGER: "What do you think caused the fainting?" "What were you doing just before you fainted?"  (e.g., exercise, sudden standing up, prolonged standing)     Unknown, walking 5. RECURRENT SYMPTOM: "Have you ever passed out before?" If so, ask: "When was the last time?" and "What happened that time?"      no 6. INJURY: "Did you sustain any injury during the fall?"      Scratched cheek, glasses hit, hand- R hand 7. CARDIAC SYMPTOMS: "Have you had any of the following symptoms: chest pain, difficulty breathing, palpitations?"     no 8. NEUROLOGIC SYMPTOMS: "Have you had any of the following symptoms: headache, numbness, vertigo, weakness?"     no 9. GI SYMPTOMS: "Have you had any of the following symptoms: abdominal pain, vomiting, diarrhea, blood in stools?"     Loose stools since yesterday 10. OTHER SYMPTOMS: "Do you have any other symptoms?"       Anxiety yesterday 11. PREGNANCY: "Is there any chance you are pregnant?"  "When was your last menstrual period?"       No- Mirena  Answer Assessment - Initial Assessment Questions 1. MECHANISM: "How did the injury happen?"     Today- 2:00 2. ONSET: "When did the injury happen?" (Minutes or hours ago)      2:00 3. APPEARANCE of INJURY: "What does the injury look like?"      R hand- 4 and 5 digit swollen- swelling in hand- gripping and pain  4. SEVERITY: "Can you use the hand normally?" "Can you bend your fingers into a ball and then fully open them?"     No- painful- moveable- yes- but can't squeeze 5. SIZE: For cuts, bruises, or swelling, ask: "How large is it?" (e.g., inches or centimeters;  entire hand or wrist)      Scratch on 5 digit 6. PAIN: "Is there pain?" If so, ask: "How bad is the pain?"  (Scale 1-10; or mild, moderate, severe)     6 7. TETANUS: For any breaks in the skin, ask: "When was the last tetanus booster?"     yes 8. OTHER SYMPTOMS: "Do you have any other symptoms?"      no 9. PREGNANCY: "Is there any chance you are pregnant?" "When was your last menstrual period?"     n/a  Protocols used: HAND AND WRIST Gerarda Fraction

## 2018-11-22 ENCOUNTER — Encounter: Payer: Self-pay | Admitting: Family Medicine

## 2018-11-22 ENCOUNTER — Telehealth: Payer: Self-pay

## 2018-11-22 ENCOUNTER — Ambulatory Visit: Payer: 59 | Admitting: Family Medicine

## 2018-11-22 VITALS — BP 126/77 | HR 88 | Temp 99.0°F | Ht 61.0 in | Wt 182.0 lb

## 2018-11-22 DIAGNOSIS — S62306A Unspecified fracture of fifth metacarpal bone, right hand, initial encounter for closed fracture: Secondary | ICD-10-CM

## 2018-11-22 DIAGNOSIS — R9431 Abnormal electrocardiogram [ECG] [EKG]: Secondary | ICD-10-CM

## 2018-11-22 NOTE — Patient Instructions (Signed)
You have a small avulsion fracture at the base of your 5th metacarpal. Wear the splint at all times. Ok to ice over the swollen areas that aren't under the splint. Elevate above your heart as much as possible. Tylenol, ibuprofen as needed for pain. Follow up with me in 2 weeks. Expect this to take 6 weeks to heal.

## 2018-11-22 NOTE — Telephone Encounter (Signed)
Copied from [place] [phone]. Topic: Referral - Request for Referral >> Nov 22, 2018 10:42 AM Richardo Priest, NT wrote: Has patient seen PCP for this complaint? No. *If NO, is insurance requiring patient see PCP for this issue before PCP can refer them? Referral for which specialty: Cardiology Preferred provider/office: Dr.Kline/ [facility] [phone] fax [phone] Reason for referral: Patient was sent to  a QT issue

## 2018-11-22 NOTE — Progress Notes (Signed)
PCP: Copland, Gay Filler, MD  Subjective:   HPI: Patient is a 43 y.o. female here for right hand injury.  Patient reports yesterday afternoon she went for a walk with family and had an episode where she either nearly passed out or passed out. She recalls events of this fall. States she had fallen onto right hand that was in front of her tucked into her body. Immediate pain, swelling. Pain is sharp and worse since in splint over to index finger and thumb. Most of pain localized to ulnar side of hand however. No prior injuries. Some numbness into fingers. She is right handed.  Past Medical History:  Diagnosis Date  . Allergic rhinitis   . History of chicken pox   . History of shingles   . Hyperlipidemia     Current Outpatient Medications on File Prior to Visit  Medication Sig Dispense Refill  . ALPRAZolam (XANAX) 0.25 MG tablet Take 1 tablet (0.25 mg total) by mouth 2 (two) times daily as needed for anxiety. 20 tablet 0  . hydrocortisone 2.5 % ointment Apply topically 2 (two) times daily. 30 g 2  . Multiple Vitamin (MULTIVITAMIN) tablet Take 1 tablet by mouth daily.    Marland Kitchen oxyCODONE-acetaminophen (PERCOCET/ROXICET) 5-325 MG tablet Take 1-2 tablets by mouth every 6 (six) hours as needed for severe pain. 8 tablet 0  . venlafaxine XR (EFFEXOR-XR) 75 MG 24 hr capsule Take 1 capsule (75 mg total) by mouth daily with breakfast. 30 capsule 6   No current facility-administered medications on file prior to visit.     Past Surgical History:  Procedure Laterality Date  . CHOLECYSTECTOMY    . HERNIA REPAIR    . wisdome teeth      Allergies  Allergen Reactions  . Sulfa Antibiotics     Childhood reaction    Social History   Socioeconomic History  . Marital status: Married    Spouse name: Not on file  . Number of children: Not on file  . Years of education: Not on file  . Highest education level: Not on file  Occupational History  . Not on file  Social Needs  . Financial  resource strain: Not on file  . Food insecurity:    Worry: Not on file    Inability: Not on file  . Transportation needs:    Medical: Not on file    Non-medical: Not on file  Tobacco Use  . Smoking status: Never Smoker  . Smokeless tobacco: Never Used  Substance and Sexual Activity  . Alcohol use: Yes    Comment: occ  . Drug use: No  . Sexual activity: Yes    Birth control/protection: I.U.D.  Lifestyle  . Physical activity:    Days per week: Not on file    Minutes per session: Not on file  . Stress: Not on file  Relationships  . Social connections:    Talks on phone: Not on file    Gets together: Not on file    Attends religious service: Not on file    Active member of club or organization: Not on file    Attends meetings of clubs or organizations: Not on file    Relationship status: Not on file  . Intimate partner violence:    Fear of current or ex partner: Not on file    Emotionally abused: Not on file    Physically abused: Not on file    Forced sexual activity: Not on file  Other Topics Concern  .  Not on file  Social History Narrative  . Not on file    Family History  Problem Relation Age of Onset  . Hypertension Maternal Grandmother   . Hypertension Maternal Grandfather   . Hypertension Paternal Grandmother   . Heart disease Paternal Grandmother   . Hypertension Paternal Grandfather   . Heart disease Paternal Grandfather   . Anesthesia problems Neg Hx   . Hypotension Neg Hx   . Pseudochol deficiency Neg Hx   . Malignant hyperthermia Neg Hx     BP 126/77   Pulse 88   Temp 99 F (37.2 C) (Oral)   Ht 5\' 1"  (1.549 m)   Wt 182 lb (82.6 kg)   BMI 34.39 kg/m   Review of Systems: See HPI above.     Objective:  Physical Exam:  Gen: NAD, comfortable in exam room  Right hand/wrist: Splint removed. Mod swelling, bruising on radial side of hand into 2nd, 3rd digits.  No malrotation or angulation of digits. FROM digits with 5/5 strength though motion is  slow. FROM wrist with 5/5 strength. Sensation intact to light touch. TTP greatest base of 4th and 5th metacarpals.  Mild tenderness over bruised area distal 2nd, 3rd metacarpal area. No snuffbox or other tenderness. NVI distally.  Left hand/wrist: No deformity. FROM with 5/5 strength. No tenderness to palpation. NVI distally.   Assessment & Plan:  1. Right 5th metacarpal fracture - independently reviewed radiographs and noted small avulsion off base of 5th metacarpal on radial side.  Placed new ulnar gutter splint today as other one was very tight and uncomfortable.  To wear this at all times.  Icing on radial side for swelling, elevation.  Tylenol or ibuprofen as needed.  F/u in 2 weeks.

## 2018-11-23 LAB — URINE CULTURE

## 2018-11-24 DIAGNOSIS — Z01419 Encounter for gynecological examination (general) (routine) without abnormal findings: Secondary | ICD-10-CM | POA: Diagnosis not present

## 2018-11-24 DIAGNOSIS — Z1151 Encounter for screening for human papillomavirus (HPV): Secondary | ICD-10-CM | POA: Diagnosis not present

## 2018-11-24 DIAGNOSIS — Z6834 Body mass index (BMI) 34.0-34.9, adult: Secondary | ICD-10-CM | POA: Diagnosis not present

## 2018-12-02 ENCOUNTER — Telehealth: Payer: Self-pay

## 2018-12-02 NOTE — Telephone Encounter (Signed)
Spoke with Tricia [name] and she has given her consent for a [facility] visit with Dr. Acie Fredrickson. Tricia [name] has been advised to have her BP, HR, and weight ready for her visit.   YOUR CARDIOLOGY TEAM HAS ARRANGED FOR AN E-VISIT FOR YOUR APPOINTMENT - PLEASE REVIEW IMPORTANT INFORMATION BELOW SEVERAL DAYS PRIOR TO YOUR APPOINTMENT  Due to the recent COVID-19 pandemic, we are transitioning in-person office visits to tele-medicine visits in an effort to decrease unnecessary exposure to our patients and staff. Medicare and most insurances are covering these visits without a copay needed. You will need a working email and a smartphone or computer with a camera and microphone. For patients that do not have these items, we can still complete the visit using a telephone but do prefer video when possible. If possible, we also ask that you have a blood pressure cuff and scale at home to measure your blood pressure, heart rate and weight prior to your scheduled appointment. Patients with clinical needs that need an in-person evaluation and testing will still be able to come to the office if absolutely necessary. If you have any questions, feel free to call our office.     DOWNLOADING THE SOFTWARE  Download the [employer] app to enable video and telephone visits with your [facility] Provider.   Instructions for downloading [employer] WebEx: - Go to https://www.webex.com/downloads.html and follow the instructions, or download the app on your smartphone [facility] [employer] Meetings). - If you have technical difficulties with downloading WebEx, please call WebEx at [phone]. - Once the app is downloaded (can be done on either mobile or desktop computer), go to Settings in the upper left hand corner.  Be sure that camera and audio are enabled.  - You will receive an email message with a link to the meeting with a time to join for your tele-health visit.  - Please download the app and have settings configured prior to the appointment time.       2-3 DAYS BEFORE YOUR APPOINTMENT  One of our staff will call you to confirm that you have been able to set up your WebEx account. We will remind you check your blood pressure, heart rate and weight prior to your scheduled appointment. If you have an Apple Watch or Kardia, please upload any pertinent ECG strips the day before or morning of your appointment to [facility]. Our staff will also make sure you have reviewed the consent and agree to move forward with your scheduled tele-health visit.    THE DAY OF YOUR APPOINTMENT  Approximately 15-20 minutes prior to your scheduled appointment, you will receive an e-mail directly from one of our staff member's [email or web address] e-mail accounts inviting you to join a WebEx meeting.  Please do not reply to that email - simply join the [employer].  Upon joining, a member of the office staff will speak with you initially through the WebEx platform to confirm medications, vital signs for the day and any symptoms you may be experiencing.  Please have this information available prior to the time of visit start.      CONSENT FOR TELE-HEALTH VISIT - PLEASE RVIEW  I hereby voluntarily request, consent and authorize [facility] and its employed or contracted physicians, physician assistants, nurse practitioners or other licensed health care professionals (the Practitioner), to provide me with telemedicine health care services (the "Services") as deemed necessary by the treating Practitioner. I acknowledge and consent to receive the Services by the Practitioner via telemedicine. I understand that the  telemedicine visit will involve communicating with the Practitioner through live audiovisual communication technology and the disclosure of certain medical information by electronic transmission. I acknowledge that I have been given the opportunity to request an in-person assessment or other available alternative prior to the telemedicine visit and am voluntarily  participating in the telemedicine visit.  I understand that I have the right to withhold or withdraw my consent to the use of telemedicine in the course of my care at any time, without affecting my right to future care or treatment, and that the Practitioner or I may terminate the telemedicine visit at any time. I understand that I have the right to inspect all information obtained and/or recorded in the course of the telemedicine visit and may receive copies of available information for a reasonable fee.  I understand that some of the potential risks of receiving the Services via telemedicine include:  Marland Kitchen Delay or interruption in medical evaluation due to technological equipment failure or disruption; . Information transmitted may not be sufficient (e.g. poor resolution of images) to allow for appropriate medical decision making by the Practitioner; and/or  . In rare instances, security protocols could fail, causing a breach of personal health information.  Furthermore, I acknowledge that it is my responsibility to provide information about my medical history, conditions and care that is complete and accurate to the best of my ability. I acknowledge that Practitioner's advice, recommendations, and/or decision may be based on factors not within their control, such as incomplete or inaccurate data provided by me or distortions of diagnostic images or specimens that may result from electronic transmissions. I understand that the practice of medicine is not an exact science and that Practitioner makes no warranties or guarantees regarding treatment outcomes. I acknowledge that I will receive a copy of this consent concurrently upon execution via email to the email address I last provided but may also request a printed copy by calling the office of [place].    I understand that my insurance will be billed for this visit.   I have read or had this consent read to me. . I understand the contents of this  consent, which adequately explains the benefits and risks of the Services being provided via telemedicine.  . I have been provided ample opportunity to ask questions regarding this consent and the Services and have had my questions answered to my satisfaction. . I give my informed consent for the services to be provided through the use of telemedicine in my medical care  By participating in this telemedicine visit I agree to the above.

## 2018-12-05 ENCOUNTER — Telehealth: Payer: Self-pay | Admitting: Cardiovascular Disease

## 2018-12-05 NOTE — Telephone Encounter (Signed)
Spoke with patient who confirmed all demographics. She uses My Chart and has a smart phone. Will have all vitals ready for visit.

## 2018-12-06 ENCOUNTER — Encounter: Payer: Self-pay | Admitting: Family Medicine

## 2018-12-06 ENCOUNTER — Other Ambulatory Visit: Payer: Self-pay

## 2018-12-06 ENCOUNTER — Ambulatory Visit: Payer: 59 | Admitting: Family Medicine

## 2018-12-06 DIAGNOSIS — S62316D Displaced fracture of base of fifth metacarpal bone, right hand, subsequent encounter for fracture with routine healing: Secondary | ICD-10-CM

## 2018-12-06 NOTE — Assessment & Plan Note (Signed)
-   independently reviewed previous x-rays from 11/21/18 demonstrating a small nondisplaced fracture of the proximal fifth metacarpal - intolerant of moldable ulnar gutter splint - transition to volar wrist splint with buddy taping of 4/5 digits, low risk fx - patient to call if having increased pain or swelling - immobilization for additional two weeks to encourage healing, then will wean out of the splint - ice, NSAIDs prn

## 2018-12-06 NOTE — Patient Instructions (Signed)
Wear the brace/buddy taping for 2 weeks regularly. Ice the area 15 minutes at a time 3-4 times a day and as needed. Elevate above your heart as much as possible. Tylenol, ibuprofen as needed for pain. Follow up with me in 2 weeks. Expect this to take 4 more weeks though I'm hopeful in 2 weeks we can discontinue the brace/taping and you can focus on motion and strengthening.

## 2018-12-06 NOTE — Progress Notes (Signed)
  Tricia [name] - 43 y.o. female MRN [id number]  Date of birth: 1975-10-15   Chief complaint: R fifth metacarpal fx f/u  SUBJECTIVE:    History of present illness: 43 year old female who presents today for follow-up of a nondisplaced right-sided proximal fifth metacarpal fracture.  Her date of injury was 11/21/2018 when she fell on an outstretched hand.  She was originally seen in the emergency department and diagnosed with this fracture.  She followed up at the sports medicine office and was placed in a ulnar gutter splint for immobilization.  She states that her pain has improved significantly.  The swelling has also decreased.  She has been removing the splint to shower.  She does endorse that she is getting some tenderness at her ulnar styloid from the splint and stiffness in her third and fourth metacarpals.  Denies any new injury or new trauma.  No numbness or tingling of the hand or wrist.  No elbow pain associated with this problem.  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  No new concerns today.   Review of systems:  Per HPI; in addition no fever, no rash, no additional weakness, no additional numbness, no additional paresthesias, and no additional fall/injury   Interval past medical history, surgical history, family history, and social history obtained and are unchanged.   Of note, she is a non-smoker.  Medications reviewed and unchanged. Allergies reviewed.  Of note she has an allergy to sulfa.  OBJECTIVE:  Physical exam: Vital signs are reviewed. BP 110/70   Ht 5\' 1"  (1.549 m)   Wt 185 lb (83.9 kg)   BMI 34.96 kg/m   Gen.: Alert, oriented, appears stated age, in no apparent distress Integumentary: No rashes, ecchymoses or superficial skin abrasions Neurologic:  Sensation is intact light touch C5-T1 bilaterally Musculoskeletal: Inspection of the right wrist and hand demonstrates no obvious swelling  or deformity.  She has mild tenderness palpation at the proximal fifth metacarpal.  Mild stiffness with flexion at the fifth MCP joint.  Full range of motion of the wrist in flexion and extension.  Full range of motion of the elbow in flexion and extension. Strength testing: 5/5 in wrist flexion and extension. R radial: 2+.  Inspection of the left wrist demonstrates no acute abnormalities.  Full range of motion wrist flexion extension.  Strength testing 5 out of 5 in all ranges of motion.  Neurovascularly intact.    ASSESSMENT & PLAN: Closed fracture of base of fifth metacarpal bone of right hand with routine healing, subsequent encounter - independently reviewed previous x-rays from 11/21/18 demonstrating a small nondisplaced fracture of the proximal fifth metacarpal - intolerant of moldable ulnar gutter splint - transition to volar wrist splint with buddy taping of 4/5 digits, low risk fx - patient to call if having increased pain or swelling - immobilization for additional two weeks to encourage healing, then will wean out of the splint - ice, NSAIDs prn    [employer]

## 2018-12-07 ENCOUNTER — Encounter: Payer: Self-pay | Admitting: Cardiovascular Disease

## 2018-12-07 ENCOUNTER — Telehealth: Payer: Self-pay | Admitting: *Deleted

## 2018-12-07 ENCOUNTER — Telehealth (INDEPENDENT_AMBULATORY_CARE_PROVIDER_SITE_OTHER): Payer: 59 | Admitting: Cardiovascular Disease

## 2018-12-07 VITALS — BP 123/86 | HR 94 | Ht 61.0 in | Wt 186.0 lb

## 2018-12-07 DIAGNOSIS — R55 Syncope and collapse: Secondary | ICD-10-CM | POA: Diagnosis not present

## 2018-12-07 DIAGNOSIS — Z7189 Other specified counseling: Secondary | ICD-10-CM | POA: Diagnosis not present

## 2018-12-07 DIAGNOSIS — R9431 Abnormal electrocardiogram [ECG] [EKG]: Secondary | ICD-10-CM | POA: Insufficient documentation

## 2018-12-07 NOTE — Telephone Encounter (Signed)
Patient enrolled for [employer] to ship a 30 day cardiac event monitor to her home.  [employer] will call to confirm shipping address and then ship monitor 2nd day air [employer].  Instructions reviewed briefly as they are included in monitor kit.

## 2018-12-07 NOTE — Patient Instructions (Addendum)
Medication Instructions:  Your physician recommends that you continue on your current medications as directed. Please refer to the Current Medication list given to you today.  If you need a refill on your cardiac medications before your next appointment, please call your pharmacy.    Lab work: None Ordered   Testing/Procedures: Your physician has requested that you have an echocardiogram. Echocardiography is a painless test that uses sound waves to create images of your heart. It provides your doctor with information about the size and shape of your heart and how well your heart's chambers and valves are working. This procedure takes approximately one hour. There are no restrictions for this procedure. **Our office will call you to schedule    Your physician has recommended that you wear an event monitor. Event monitors are medical devices that record the heart's electrical activity. Doctors most often [place] these monitors to diagnose arrhythmias. Arrhythmias are problems with the speed or rhythm of the heartbeat. The monitor is a small, portable device. You can wear one while you do your normal daily activities. This is usually used to diagnose what is causing palpitations/syncope (passing out).    Follow-Up: Your physician recommends that you return for a follow-up appointment on Friday August 21 at 8:20 am with Dr. Acie Fredrickson [address], [place]

## 2018-12-07 NOTE — Progress Notes (Signed)
Virtual Visit via Video Note   This visit type was conducted due to national recommendations for restrictions regarding the COVID-19 Pandemic (e.g. social distancing) in an effort to limit this patient's exposure and mitigate transmission in our community.  Due to her co-morbid illnesses, this patient is at least at moderate risk for complications without adequate follow up.  This format is felt to be most appropriate for this patient at this time.  All issues noted in this document were discussed and addressed.  A limited physical exam was performed with this format.  Please refer to the patient's chart for her consent to telehealth for [facility].   Evaluation Performed:  Follow-up visit  Date:  12/07/2018   ID:  Tricia [name], DOB Dec 18, 1975, MRN [id number]  Patient Location: Home Provider Location: Home  PCP:  Copland, Gay Filler, MD  Cardiologist:   Jerrell Hart  Electrophysiologist:  None   Chief Complaint:   Abnormal ECG  Problem List : 1.  Abnormal ECG :  Short PR interval  2. Near syncope   History of Present Illness:    Tricia [name] is a 43 y.o. female with an episode of near syncope and an abnormal ECG .  Had a near syncopal episode several week ago. Was out walking several weeks ago. Not strenuous. Felt a little off early that AM Felt hot.    Had been eating and drinking normally  Does not exercise normally  Had been up walking for a while.  Felt chest tightness afterwards Did not notice tachypalps  Does not recall if her CP was a  MSK pain   [place] [address], injured her hand  No further episodes  Lasted 5 minutes   + family hx of cardiac issues Father has cardiomyopathy, pacer, Grandfather has AF   The patient does not have symptoms concerning for COVID-19 infection (fever, chills, cough, or new shortness of breath).    Past Medical History:  Diagnosis Date  . Allergic rhinitis   . History of chicken pox   . History of shingles   . Hyperlipidemia     Past Surgical History:  Procedure Laterality Date  . CHOLECYSTECTOMY    . HERNIA REPAIR    . wisdome teeth       No outpatient medications have been marked as taking for the 12/07/18 encounter (Appointment) with Pradyun Ishman, Wonda Cheng, MD.     Allergies:   Sulfa antibiotics   Social History   Tobacco Use  . Smoking status: Never Smoker  . Smokeless tobacco: Never Used  Substance Use Topics  . Alcohol use: Yes    Comment: occ  . Drug use: No     Family Hx: The patient's family history includes Heart disease in her paternal grandfather and paternal grandmother; Hypertension in her maternal grandfather, maternal grandmother, paternal grandfather, and paternal grandmother. There is no history of Anesthesia problems, Hypotension, Pseudochol deficiency, or Malignant hyperthermia.  ROS:   Please see the history of present illness.     All other systems reviewed and are negative.   Prior CV studies:   The following studies were reviewed today:    Labs/Other Tests and Data Reviewed:    EKG:  No ECG reviewed.  Recent Labs: 07/25/2018: TSH 0.90 11/21/2018: ALT 19; BUN 15; Creatinine, Ser 0.76; Hemoglobin 15.3; Platelets 320; Potassium 3.6; Sodium 133   Recent Lipid Panel Lab Results  Component Value Date/Time   CHOL 213 (H) 07/25/2018 09:49 AM   TRIG 165.0 (H) 07/25/2018 09:49 AM  HDL 49.60 07/25/2018 09:49 AM   CHOLHDL 4 07/25/2018 09:49 AM   LDLCALC 130 (H) 07/25/2018 09:49 AM   LDLDIRECT 138.0 05/19/2017 12:43 PM    Wt Readings from Last 3 Encounters:  12/06/18 185 lb (83.9 kg)  11/22/18 182 lb (82.6 kg)  11/21/18 183 lb 10.3 oz (83.3 kg)     Objective:    Vital Signs:  There were no vitals taken for this visit.   VITAL SIGNS:  reviewed  General:   Appears healthy,   NAD,  Moderately obese  HEENT:   No obvious JVD or lymphadenopathy Resp:   Normal work of breathing,   resp rate is normal  CV :   BP and HR are normal ,  No edema Abd:   No abdomina swelling ,  Ext:   No clubbing, cyanosis, or edema  Neuro:   Alert and oriented x 3.   No obvious motor deficits Skin : no obvious rashes    ASSESSMENT & PLAN:    1. Near Syncope: Cadience is seen today for further evaluation of an episode of near syncope.  This occurred while she was out walking slowly with her kids.  She had not had any previous episodes and and has not had any episodes since she was seen in the emergency room and her work-up was unremarkable.  She was found to have a short PR interval.  I would  also like to do an echocardiogram for further evaluation.  Have encouraged her to start an exercise program.  It will be interesting to see if she has any exercise-induced symptoms.  I have encouraged her to eat well to stay hydrated.   COVID-19 Education: The signs and symptoms of COVID-19 were discussed with the patient and how to seek care for testing (follow up with PCP or arrange E-visit).  The importance of social distancing was discussed today.  Time:   Today, I have spent  30  minutes with the patient with telehealth technology discussing the above problems.     Medication Adjustments/Labs and Tests Ordered: Current medicines are reviewed at length with the patient today.  Concerns regarding medicines are outlined above.   Tests Ordered: No orders of the defined types were placed in this encounter.   Medication Changes: No orders of the defined types were placed in this encounter.   Disposition:  Follow up in 4 month(s)  Signed, Mertie Moores, MD  12/07/2018 1:11 PM    [facility]

## 2018-12-09 ENCOUNTER — Ambulatory Visit (INDEPENDENT_AMBULATORY_CARE_PROVIDER_SITE_OTHER): Payer: 59

## 2018-12-09 DIAGNOSIS — R55 Syncope and collapse: Secondary | ICD-10-CM

## 2018-12-20 ENCOUNTER — Ambulatory Visit: Payer: 59 | Admitting: Family Medicine

## 2018-12-20 ENCOUNTER — Telehealth: Payer: Self-pay | Admitting: Cardiovascular Disease

## 2018-12-20 ENCOUNTER — Encounter: Payer: Self-pay | Admitting: Family Medicine

## 2018-12-20 ENCOUNTER — Other Ambulatory Visit: Payer: Self-pay

## 2018-12-20 VITALS — BP 131/90 | Ht 61.0 in | Wt 185.0 lb

## 2018-12-20 DIAGNOSIS — S62316D Displaced fracture of base of fifth metacarpal bone, right hand, subsequent encounter for fracture with routine healing: Secondary | ICD-10-CM

## 2018-12-20 NOTE — Telephone Encounter (Signed)
New message   Pt c/o BP issue: STAT if pt c/o blurred vision, one-sided weakness or slurred speech  1. What are your last 5 BP readings? 133/98, 130/92, 130/90  Patient only had these 3 readings.  2. Are you having any other symptoms (ex. Dizziness, headache, blurred vision, passed out)? No issues  3. What is your BP issue? Patient has heart monitor and BP was off she didn't know if she needed to report it.  Would like a nurse to call her.

## 2018-12-21 ENCOUNTER — Encounter: Payer: Self-pay | Admitting: Family Medicine

## 2018-12-21 NOTE — Telephone Encounter (Signed)
Spoke with patient who states she wanted to report elevated BP readings. States yesterday was a stressful day at work and then she had an appointment for her broken hand so possibly the elevated readings are situational. She denies s/s of syncope since the one episode reported in April. I advised that per Dr. Acie Fredrickson she can start a low dose diuretic for elevated BP. I advised her to monitor sodium in diet which can cause elevated BP readings. She states she would like to monitor for a while longer and call us back to report. She is currently wearing a cardiac monitor and occasionally notes a feeling of not being able to take a deep breath. She will press the button on the monitor so that we can pinpoint those occasions on her monitor report. I advised her to take some BP readings when she is relaxed through the weekend and to call back if readings remain high. She verbalized understanding and agreement and thanked me for the call.

## 2018-12-21 NOTE — Progress Notes (Signed)
PCP: Copland, Gay Filler, MD  Subjective:   HPI: Patient is a 43 y.o. female here for right hand injury.  4/14: Patient reports yesterday afternoon she went for a walk with family and had an episode where she either nearly passed out or passed out. She recalls events of this fall. States she had fallen onto right hand that was in front of her tucked into her body. Immediate pain, swelling. Pain is sharp and worse since in splint over to index finger and thumb. Most of pain localized to ulnar side of hand however. No prior injuries. Some numbness into fingers. She is right handed.  24/31: 43 year old female who presents today for follow-up of a nondisplaced right-sided proximal fifth metacarpal fracture.  Her date of injury was 11/21/2018 when she fell on an outstretched hand.  She was originally seen in the emergency department and diagnosed with this fracture.  She followed up at the sports medicine office and was placed in a ulnar gutter splint for immobilization.  She states that her pain has improved significantly.  The swelling has also decreased.  She has been removing the splint to shower.  She does endorse that she is getting some tenderness at her ulnar styloid from the splint and stiffness in her third and fourth metacarpals.  Denies any new injury or new trauma.  No numbness or tingling of the hand or wrist.  No elbow pain associated with this problem.  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  No new concerns today.  5/12: Patient reports she's doing very well. About 70% improved. Had some soreness especially the first couple days after last visit. Pain laterally at hand - unsure if brace is rubbing on this area or it's related to the fracture. Buddy taping 4th and 5th digits with this. No skin changes, numbness.  Past Medical History:  Diagnosis Date  . Allergic rhinitis   . History of chicken pox    . History of shingles   . Hyperlipidemia     Current Outpatient Medications on File Prior to Visit  Medication Sig Dispense Refill  . cyclobenzaprine (FLEXERIL) 10 MG tablet Take 10 mg by mouth 3 (three) times daily as needed for muscle spasms.    . ALPRAZolam (XANAX) 0.25 MG tablet Take 1 tablet (0.25 mg total) by mouth 2 (two) times daily as needed for anxiety. 20 tablet 0  . Multiple Vitamin (MULTIVITAMIN) tablet Take 1 tablet by mouth daily.    Marland Kitchen oxyCODONE-acetaminophen (PERCOCET/ROXICET) 5-325 MG tablet Take 1-2 tablets by mouth every 6 (six) hours as needed for severe pain. 8 tablet 0  . venlafaxine XR (EFFEXOR-XR) 75 MG 24 hr capsule Take 1 capsule (75 mg total) by mouth daily with breakfast. 30 capsule 6   No current facility-administered medications on file prior to visit.     Past Surgical History:  Procedure Laterality Date  . CHOLECYSTECTOMY    . HERNIA REPAIR    . wisdome teeth      Allergies  Allergen Reactions  . Sulfa Antibiotics     Childhood reaction    Social History   Socioeconomic History  . Marital status: Married    Spouse name: Not on file  . Number of children: Not on file  . Years of education: Not on file  . Highest education level: Not on file  Occupational History  . Not on file  Social Needs  . Emergency planning/management officer  strain: Not on file  . Food insecurity:    Worry: Not on file    Inability: Not on file  . Transportation needs:    Medical: Not on file    Non-medical: Not on file  Tobacco Use  . Smoking status: Never Smoker  . Smokeless tobacco: Never Used  Substance and Sexual Activity  . Alcohol use: Yes    Comment: occ  . Drug use: No  . Sexual activity: Yes    Birth control/protection: I.U.D.  Lifestyle  . Physical activity:    Days per week: Not on file    Minutes per session: Not on file  . Stress: Not on file  Relationships  . Social connections:    Talks on phone: Not on file    Gets together: Not on file    Attends  religious service: Not on file    Active member of club or organization: Not on file    Attends meetings of clubs or organizations: Not on file    Relationship status: Not on file  . Intimate partner violence:    Fear of current or ex partner: Not on file    Emotionally abused: Not on file    Physically abused: Not on file    Forced sexual activity: Not on file  Other Topics Concern  . Not on file  Social History Narrative  . Not on file    Family History  Problem Relation Age of Onset  . Hypertension Maternal Grandmother   . Hypertension Maternal Grandfather   . Hypertension Paternal Grandmother   . Heart disease Paternal Grandmother   . Hypertension Paternal Grandfather   . Heart disease Paternal Grandfather   . Anesthesia problems Neg Hx   . Hypotension Neg Hx   . Pseudochol deficiency Neg Hx   . Malignant hyperthermia Neg Hx     BP 131/90   Ht 5\' 1"  (1.549 m)   Wt 185 lb (83.9 kg)   BMI 34.96 kg/m   Review of Systems: See HPI above.     Objective:  Physical Exam:  Gen: NAD, comfortable in exam room  Right hand/wrist: Brace removed. No swelling, bruising, other deformity.  No malrotation or angulation. FROM digits with 5/5 strength. FROM wrist with 5/5 strength. Sensation intact to light touch. TTP base of 5th metacarpal on ulnar side, minimal tenderness on radial side.  No other tenderness. NVI distally.   Assessment & Plan:  1. Right 5th metacarpal fracture - Brief MSK u/s today shows interval callus at fracture site.  Clinically much improved 4 weeks out from small avulsion radial side base 5th metacarpal.  Discontinue immobilization - can buddy tape/brace only if needed.  Icing, tylenol, ibuprofen if needed.  F/u prn.

## 2019-01-05 ENCOUNTER — Telehealth (HOSPITAL_COMMUNITY): Payer: Self-pay

## 2019-01-05 NOTE — Telephone Encounter (Signed)

## 2019-01-06 ENCOUNTER — Other Ambulatory Visit: Payer: Self-pay

## 2019-01-06 ENCOUNTER — Ambulatory Visit (HOSPITAL_COMMUNITY): Payer: 59 | Attending: Internal Medicine

## 2019-01-06 DIAGNOSIS — R55 Syncope and collapse: Secondary | ICD-10-CM | POA: Diagnosis not present

## 2019-01-11 ENCOUNTER — Other Ambulatory Visit: Payer: Self-pay

## 2019-01-12 ENCOUNTER — Telehealth: Payer: Self-pay | Admitting: Nurse Practitioner

## 2019-01-12 NOTE — Telephone Encounter (Signed)
Reviewed monitor results with patient. She has had no further episodes of syncope. She asked about appointment scheduled for August with Dr. Acie Fredrickson and I advised that she may follow-up in the future as needed or he can evaluate her in person at that time. She asks about trivial MR seen on echo and I advised that we will need to recheck echo in a few years or sooner if she develops symptoms or if a healthcare provider notices that murmur is getting louder. She asked questions about her cholesterol which I answered to her satisfaction. She wants to work on better diet and exercise before starting medication and is aware of coronary calcium scoring which can be done in the future if she chooses. I advised that she may call anytime with questions or concerns and that I will place a 1 year recall as a reminder for her to schedule a future appointment with Dr. Acie Fredrickson. She thanked me for my help.

## 2019-01-12 NOTE — Telephone Encounter (Signed)
-----   Message from Thayer Headings, MD sent at 01/12/2019 11:35 AM EDT ----- Sinus rhythm. No explanation for her episode of syncope

## 2019-03-31 ENCOUNTER — Ambulatory Visit: Payer: 59 | Admitting: Cardiovascular Disease

## 2019-04-12 MED FILL — VENLAFAXINE HCL ER 75 MG CA: 75 | 30 days supply | Qty: 30 | Fill #2

## 2019-05-23 MED FILL — VENLAFAXINE HCL ER 75 MG CA: 75 | 30 days supply | Qty: 30 | Fill #0

## 2019-07-27 NOTE — Patient Instructions (Addendum)
It was good to see you again today, I will be in touch with your labs ASAP I will set you up to see urology- start doing your kegels now!  About 100 reps per day  Let's get you back on Effexor XR daily- try for a good 4-6 weeks to see if it will help with your anxiety You can also use the alprazolam on occasion for more severe anxiety or panic, but remember this can be habit forming and sedating    Health Maintenance, Female Adopting a healthy lifestyle and getting preventive care are important in promoting health and wellness. Ask your health care provider about:  The right schedule for you to have regular tests and exams.  Things you can do on your own to prevent diseases and keep yourself healthy. What should I know about diet, weight, and exercise? Eat a healthy diet   Eat a diet that includes plenty of vegetables, fruits, low-fat dairy products, and lean protein.  Do not eat a lot of foods that are high in solid fats, added sugars, or sodium. Maintain a healthy weight Body mass index (BMI) is used to identify weight problems. It estimates body fat based on height and weight. Your health care provider can help determine your BMI and help you achieve or maintain a healthy weight. Get regular exercise Get regular exercise. This is one of the most important things you can do for your health. Most adults should:  Exercise for at least 150 minutes each week. The exercise should increase your heart rate and make you sweat (moderate-intensity exercise).  Do strengthening exercises at least twice a week. This is in addition to the moderate-intensity exercise.  Spend less time sitting. Even light physical activity can be beneficial. Watch cholesterol and blood lipids Have your blood tested for lipids and cholesterol at 42 years of age, then have this test every 5 years. Have your cholesterol levels checked more often if:  Your lipid or cholesterol levels are high.  You are older than 42  years of age.  You are at high risk for heart disease. What should I know about cancer screening? Depending on your health history and family history, you may need to have cancer screening at various ages. This may include screening for:  Breast cancer.  Cervical cancer.  Colorectal cancer.  Skin cancer.  Lung cancer. What should I know about heart disease, diabetes, and high blood pressure? Blood pressure and heart disease  High blood pressure causes heart disease and increases the risk of stroke. This is more likely to develop in people who have high blood pressure readings, are of African descent, or are overweight.  Have your blood pressure checked: ? Every 3-5 years if you are 61-40 years of age. ? Every year if you are 5 years old or older. Diabetes Have regular diabetes screenings. This checks your fasting blood sugar level. Have the screening done:  Once every three years after age 106 if you are at a normal weight and have a low risk for diabetes.  More often and at a younger age if you are overweight or have a high risk for diabetes. What should I know about preventing infection? Hepatitis B If you have a higher risk for hepatitis B, you should be screened for this virus. Talk with your health care provider to find out if you are at risk for hepatitis B infection. Hepatitis C Testing is recommended for:  Everyone born from [birth year] through 1964.  Anyone with  known risk factors for hepatitis C. Sexually transmitted infections (STIs)  Get screened for STIs, including gonorrhea and chlamydia, if: ? You are sexually active and are younger than 42 years of age. ? You are older than 42 years of age and your health care provider tells you that you are at risk for this type of infection. ? Your sexual activity has changed since you were last screened, and you are at increased risk for chlamydia or gonorrhea. Ask your health care provider if you are at risk.  Ask your health  care provider about whether you are at high risk for HIV. Your health care provider may recommend a prescription medicine to help prevent HIV infection. If you choose to take medicine to prevent HIV, you should first get tested for HIV. You should then be tested every 3 months for as long as you are taking the medicine. Pregnancy  If you are about to stop having your period (premenopausal) and you may become pregnant, seek counseling before you get pregnant.  Take 400 to 800 micrograms (mcg) of folic acid every day if you become pregnant.  Ask for birth control (contraception) if you want to prevent pregnancy. Osteoporosis and menopause Osteoporosis is a disease in which the bones lose minerals and strength with aging. This can result in bone fractures. If you are 67 years old or older, or if you are at risk for osteoporosis and fractures, ask your health care provider if you should:  Be screened for bone loss.  Take a calcium or vitamin D supplement to lower your risk of fractures.  Be given hormone replacement therapy (HRT) to treat symptoms of menopause. Follow these instructions at home: Lifestyle  Do not use any products that contain nicotine or tobacco, such as cigarettes, e-cigarettes, and chewing tobacco. If you need help quitting, ask your health care provider.  Do not use street drugs.  Do not share needles.  Ask your health care provider for help if you need support or information about quitting drugs. Alcohol use  Do not drink alcohol if: ? Your health care provider tells you not to drink. ? You are pregnant, may be pregnant, or are planning to become pregnant.  If you drink alcohol: ? Limit how much you use to 0-1 drink a day. ? Limit intake if you are breastfeeding.  Be aware of how much alcohol is in your drink. In the [place] one drink equals one 12 oz bottle of beer (355 mL), one 5 oz glass of wine (148 mL), or one 1 oz glass of hard liquor (44 mL). General  instructions  Schedule regular health, dental, and eye exams.  Stay current with your vaccines.  Tell your health care provider if: ? You often feel depressed. ? You have ever been abused or do not feel safe at home. Summary  Adopting a healthy lifestyle and getting preventive care are important in promoting health and wellness.  Follow your health care provider's instructions about healthy diet, exercising, and getting tested or screened for diseases.  Follow your health care provider's instructions on monitoring your cholesterol and blood pressure. This information is not intended to replace advice given to you by your health care provider. Make sure you discuss any questions you have with your health care provider. Document Released: 02/09/2011 Document Revised: 07/20/2018 Document Reviewed: 07/20/2018 Elsevier Patient Education  2020 [employer].

## 2019-07-27 NOTE — Progress Notes (Addendum)
[place] [facility] [address] [place], [place] [address] [phone]  Date:  07/31/2019   Name:  Tricia [name]   DOB:  04/01/1976   MRN:  [id number]  PCP:  [name], MD    Chief Complaint: Annual Exam   History of Present Illness:  Tricia [name] is a 43 y.o. very pleasant female patient who presents with the following:  Here today for routine physical-virtual visit due to provider quarantine.  Patient location is her car, provider location is office.  Patient and myself are on the call today, she gives consent for virtual visit.  Her identity is confirmed with 2 factors Patient with history of anxiety Last seen by myself about 1 year ago for physical Treated with venlafaxine and alprazolam- however she is not really taking these right now.  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 Acie Fredrickson ordered an echo, 30 day monitor completed in June, normal Per pt this was called a near- syncopal episode, has not happened again.  She is feeling fine  Mammogram- she plans to do every other year Pap done this year per her GYN DR Genevieve Norlander- in spring of this year Flu vaccine complete Routine blood work today  She notes that she often feels tired, but thinks this may just be due to stress She did do a sleep study that did not show OSA not long ago- 2019  She has noted some issues with her bladder- she has stress incontinence She may have leakage that she cannot even feel She is leaking a  small amount-does not have to change her close She has to wear a liner some of the time - but not always She will leak with cough or sneeze She would like to talk to urology about this, I will make a referral for her  Wt Readings from Last 3 Encounters:  07/31/19 188 lb (85.3 kg)  12/20/18 185 lb (83.9 kg)  12/07/18 186 lb (84.4 kg)    Weight in 2018, 181  Patient Active Problem List   Diagnosis Date Noted  . Near syncope 12/07/2018  . Nonspecific abnormal electrocardiogram (ECG) (EKG) 12/07/2018  . Closed fracture of base of fifth metacarpal bone of right hand with routine healing, subsequent encounter 12/06/2018  . Plantar fasciitis of right foot 09/28/2017  . GAD (generalized anxiety disorder) 05/19/2017  . Closed fracture of third toe of right foot, with nonunion, subsequent encounter 04/19/2017    Past Medical History:  Diagnosis Date  . Allergic rhinitis   . History of chicken pox   . History of shingles   . Hyperlipidemia     Past Surgical History:  Procedure Laterality Date  . CHOLECYSTECTOMY    . HERNIA REPAIR    . wisdome teeth      Social History   Tobacco Use  . Smoking status: Never Smoker  . Smokeless tobacco: Never Used  Substance Use Topics  . Alcohol use: Yes    Comment: occ  .  Drug use: No    Family History  Problem Relation Age of Onset  . Hypertension Maternal Grandmother   . Hypertension Maternal Grandfather   . Hypertension Paternal Grandmother   . Heart disease Paternal Grandmother   . Hypertension Paternal Grandfather   . Heart disease Paternal Grandfather   . Anesthesia problems Neg Hx   . Hypotension Neg Hx   . Pseudochol deficiency Neg Hx   . Malignant hyperthermia Neg Hx     Allergies  Allergen Reactions  . Sulfa Antibiotics     Childhood reaction    Medication list has been reviewed and updated.  Current Outpatient Medications on File Prior to Visit  Medication Sig Dispense Refill  . cyclobenzaprine (FLEXERIL) 10 MG  tablet Take 10 mg by mouth 3 (three) times daily as needed for muscle spasms.    . Multiple Vitamin (MULTIVITAMIN) tablet Take 1 tablet by mouth daily.     No current facility-administered medications on file prior to visit.    Review of Systems:  As per HPI- otherwise negative.   Physical Examination: Vitals:   07/31/19 0855  BP: 127/80  Pulse: 74  Resp: 16  Temp: (!) 97.1 F (36.2 C)  SpO2: 98%   Vitals:   07/31/19 0855  Weight: 188 lb (85.3 kg)  Height: 5\' 1"  (1.549 m)   Body mass index is 35.52 kg/m. Ideal Body Weight: Weight in (lb) to have BMI = 25: 132  Pt observed over video monitor  She looks well, no cough, wheezing, distress is noted Patient also came into the office to have her vitals taken and have her blood drawn today Assessment and Plan: Physical exam  GAD (generalized anxiety disorder) - Plan: venlafaxine XR (EFFEXOR-XR) 75 MG 24 hr capsule, ALPRAZolam (XANAX) 0.25 MG tablet  Screening for hyperlipidemia - Plan: Lipid panel  Screening for deficiency anemia - Plan: CBC  Screening for diabetes mellitus - Plan: Comprehensive metabolic panel, Hemoglobin A1c  Screening for thyroid disorder - Plan: TSH  Fatigue, unspecified type - Plan: B12  Stress incontinence - Plan: Ambulatory referral to Urology  Here today for a virtual physical exam Labs pending as above-we will be in touch with patient pending results She has noted some fatigue, thinks this may be anxiety related.  We will also check a B12 and thyroid Referral to urology to discuss incontinence We will have her start back on venlafaxine ER 75 mg daily, also refilled her as needed Xanax  This visit occurred during the SARS-CoV-2 public health emergency.  Safety protocols were in place, including screening questions prior to the visit, additional usage of staff PPE, and extensive cleaning of exam room while observing appropriate contact time as indicated for disinfecting solutions.     Signed Lamar Blinks, MD  Addendum 12/22, received her labs and also a MyChart message from patient as follows  Dr Lorelei Pont  I did completely forget to talk with you about my weight and diet.   I feel that I have gained about 10+ pounds over the last few months with no explanation.  I know that there may be some changes, given the pandemic, but I seem to keep gaining about 10 pounds each year.  I haven't had much in way of diet changes and lifestyle doesn't seem that much different either.  Is there anything you might recommend to help change this? I'm beginning to feel really bad about this and myself.  Thanks for any suggestions.   Results for orders placed or performed  in visit on 07/31/19  CBC  Result Value Ref Range   WBC 11.1 (H) 4.0 - 10.5 K/uL   RBC 4.57 3.87 - 5.11 Mil/uL   Platelets 307.0 150.0 - 400.0 K/uL   Hemoglobin 14.7 12.0 - 15.0 g/dL   HCT 44.2 36.0 - 46.0 %   MCV 96.6 78.0 - 100.0 fl   MCHC 33.3 30.0 - 36.0 g/dL   RDW 12.9 11.5 - 15.5 %  Comprehensive metabolic panel  Result Value Ref Range   Sodium 137 135 - 145 mEq/L   Potassium 3.6 3.5 - 5.1 mEq/L   Chloride 101 96 - 112 mEq/L   CO2 27 19 - 32 mEq/L   Glucose, Bld 99 70 - 99 mg/dL   BUN 11 6 - 23 mg/dL   Creatinine, Ser 0.70 0.40 - 1.20 mg/dL   Total Bilirubin 0.6 0.2 - 1.2 mg/dL   Alkaline Phosphatase 82 39 - 117 U/L   AST 19 0 - 37 U/L   ALT 21 0 - 35 U/L   Total Protein 7.2 6.0 - 8.3 g/dL   Albumin 4.1 3.5 - 5.2 g/dL   GFR 91.68 >60.00 mL/min   Calcium 9.2 8.4 - 10.5 mg/dL  Hemoglobin A1c  Result Value Ref Range   Hgb A1c MFr Bld 5.8 4.6 - 6.5 %  Lipid panel  Result Value Ref Range   Cholesterol 234 (H) 0 - 200 mg/dL   Triglycerides 275.0 (H) 0.0 - 149.0 mg/dL   HDL 51.30 >39.00 mg/dL   VLDL 55.0 (H) 0.0 - 40.0 mg/dL   Total CHOL/HDL Ratio 5    NonHDL 182.47   TSH  Result Value Ref Range   TSH 1.19 0.35 - 4.50 uIU/mL  B12  Result Value Ref Range   Vitamin B-12 124 (L) 211 -  911 pg/mL  LDL cholesterol, direct  Result Value Ref Range   Direct LDL 142.0 mg/dL    Her white blood cell count tends toward the higher range of normal Lipids are worse than usual, other cardiovascular risk score is still good The 10-year ASCVD risk score Mikey Bussing DC Jr., et al., 2013) is: 1%   Values used to calculate the score:     Age: 76 years     Sex: Female     Is Non-Hispanic African American: No     Diabetic: No     Tobacco smoker: No     Systolic Blood Pressure: [id number] mmHg     Is BP treated: No     HDL Cholesterol: 51.3 mg/dL     Total Cholesterol: 234 mg/dL

## 2019-07-30 ENCOUNTER — Encounter: Payer: Self-pay | Admitting: Family Medicine

## 2019-07-31 ENCOUNTER — Encounter: Payer: Self-pay | Admitting: Family Medicine

## 2019-07-31 ENCOUNTER — Ambulatory Visit (INDEPENDENT_AMBULATORY_CARE_PROVIDER_SITE_OTHER): Payer: 59 | Admitting: Family Medicine

## 2019-07-31 ENCOUNTER — Other Ambulatory Visit: Payer: Self-pay

## 2019-07-31 VITALS — BP 127/80 | HR 74 | Temp 97.1°F | Resp 16 | Ht 61.0 in | Wt 188.0 lb

## 2019-07-31 DIAGNOSIS — N393 Stress incontinence (female) (male): Secondary | ICD-10-CM | POA: Diagnosis not present

## 2019-07-31 DIAGNOSIS — R5383 Other fatigue: Secondary | ICD-10-CM | POA: Diagnosis not present

## 2019-07-31 DIAGNOSIS — Z130 Encounter for screening for diseases of the blood and blood-forming organs and certain disorders involving the immune mechanism: Secondary | ICD-10-CM | POA: Diagnosis not present

## 2019-07-31 DIAGNOSIS — Z0000 Encounter for general adult medical examination without abnormal findings: Secondary | ICD-10-CM

## 2019-07-31 DIAGNOSIS — F411 Generalized anxiety disorder: Secondary | ICD-10-CM

## 2019-07-31 DIAGNOSIS — Z1329 Encounter for screening for other suspected endocrine disorder: Secondary | ICD-10-CM

## 2019-07-31 DIAGNOSIS — Z131 Encounter for screening for diabetes mellitus: Secondary | ICD-10-CM

## 2019-07-31 DIAGNOSIS — Z13220 Encounter for screening for lipoid disorders: Secondary | ICD-10-CM | POA: Diagnosis not present

## 2019-07-31 LAB — LIPID PANEL
Cholesterol: 234 mg/dL — ABNORMAL HIGH (ref 0–200)
HDL: 51.3 mg/dL (ref >39.00)
NonHDL: 182.47
Total CHOL/HDL Ratio: 5
Triglycerides: 275 mg/dL — ABNORMAL HIGH (ref 0.0–149.0)
VLDL: 55 mg/dL — ABNORMAL HIGH (ref 0.0–40.0)

## 2019-07-31 LAB — CBC
HCT: 44.2 % (ref 36.0–46.0)
Hemoglobin: 14.7 g/dL (ref 12.0–15.0)
MCHC: 33.3 g/dL (ref 30.0–36.0)
MCV: 96.6 fl (ref 78.0–100.0)
Platelets: 307 10*3/uL (ref 150.0–400.0)
RBC: 4.57 Mil/uL (ref 3.87–5.11)
RDW: 12.9 % (ref 11.5–15.5)
WBC: 11.1 10*3/uL — ABNORMAL HIGH (ref 4.0–10.5)

## 2019-07-31 LAB — LDL CHOLESTEROL, DIRECT: Direct LDL: 142 mg/dL

## 2019-07-31 LAB — COMPREHENSIVE METABOLIC PANEL
ALT: 21 U/L (ref 0–35)
AST: 19 U/L (ref 0–37)
Albumin: 4.1 g/dL (ref 3.5–5.2)
Alkaline Phosphatase: 82 U/L (ref 39–117)
BUN: 11 mg/dL (ref 6–23)
CO2: 27 mEq/L (ref 19–32)
Calcium: 9.2 mg/dL (ref 8.4–10.5)
Chloride: 101 mEq/L (ref 96–112)
Creatinine, Ser: 0.7 mg/dL (ref 0.40–1.20)
GFR: 91.68 mL/min (ref >60.00)
Glucose, Bld: 99 mg/dL (ref 70–99)
Potassium: 3.6 mEq/L (ref 3.5–5.1)
Sodium: 137 mEq/L (ref 135–145)
Total Bilirubin: 0.6 mg/dL (ref 0.2–1.2)
Total Protein: 7.2 g/dL (ref 6.0–8.3)

## 2019-07-31 LAB — VITAMIN B12: Vitamin B-12: 124 pg/mL — ABNORMAL LOW (ref 211–911)

## 2019-07-31 LAB — TSH: TSH: 1.19 u[IU]/mL (ref 0.35–4.50)

## 2019-07-31 LAB — HEMOGLOBIN A1C: Hgb A1c MFr Bld: 5.8 % (ref 4.6–6.5)

## 2019-07-31 MED ORDER — VENLAFAXINE HCL ER 75 MG PO CP24: 75.0000 mg | ORAL_CAPSULE | Freq: Every day | ORAL | 3 refills | Status: DC

## 2019-07-31 MED ORDER — ALPRAZOLAM 0.25 MG PO TABS: 0.2500 mg | ORAL_TABLET | Freq: Two times a day (BID) | ORAL | 0 refills | Status: DC | PRN

## 2019-07-31 MED FILL — ALPRAZolam 0.25 MG TABS: 0.25 | 10 days supply | Qty: 20 | Fill #0

## 2019-07-31 MED FILL — VENLAFAXINE HCL ER 75 MG CA: 75 | 90 days supply | Qty: 90 | Fill #0

## 2019-08-01 ENCOUNTER — Encounter: Payer: Self-pay | Admitting: Family Medicine

## 2019-08-02 ENCOUNTER — Encounter: Payer: Self-pay | Admitting: Family Medicine

## 2019-08-02 DIAGNOSIS — E785 Hyperlipidemia, unspecified: Secondary | ICD-10-CM

## 2019-08-02 DIAGNOSIS — R7303 Prediabetes: Secondary | ICD-10-CM

## 2019-08-02 MED ORDER — ROSUVASTATIN CALCIUM 10 MG PO TABS: 10.0000 mg | ORAL_TABLET | Freq: Every day | ORAL | 3 refills | Status: DC

## 2019-08-02 MED ORDER — CYCLOBENZAPRINE HCL 10 MG PO TABS: 10.0000 mg | ORAL_TABLET | Freq: Three times a day (TID) | ORAL | 1 refills | Status: DC | PRN

## 2019-08-02 MED ORDER — METFORMIN HCL 500 MG PO TABS: 500.0000 mg | ORAL_TABLET | Freq: Every day | ORAL | 3 refills | Status: DC

## 2019-08-02 MED FILL — CYCLOBENZAPRINE HCL 10 MG T: 10 | 10 days supply | Qty: 30 | Fill #0

## 2019-09-13 MED FILL — ROSUVASTATIN CALCIUM 10 MG: 10 | 90 days supply | Qty: 90 | Fill #0

## 2019-09-29 DIAGNOSIS — R351 Nocturia: Secondary | ICD-10-CM | POA: Diagnosis not present

## 2019-09-29 DIAGNOSIS — R8271 Bacteriuria: Secondary | ICD-10-CM | POA: Diagnosis not present

## 2019-09-29 DIAGNOSIS — R350 Frequency of micturition: Secondary | ICD-10-CM | POA: Diagnosis not present

## 2019-09-29 DIAGNOSIS — N3946 Mixed incontinence: Secondary | ICD-10-CM | POA: Diagnosis not present

## 2019-11-15 ENCOUNTER — Encounter: Payer: Self-pay | Admitting: Family Medicine

## 2019-11-15 ENCOUNTER — Other Ambulatory Visit: Payer: Self-pay

## 2019-11-15 ENCOUNTER — Ambulatory Visit: Payer: 59 | Admitting: Family Medicine

## 2019-11-15 VITALS — BP 118/74 | Ht 61.0 in | Wt 188.0 lb

## 2019-11-15 DIAGNOSIS — G5603 Carpal tunnel syndrome, bilateral upper limbs: Secondary | ICD-10-CM

## 2019-11-15 NOTE — Patient Instructions (Signed)
You have carpal tunnel syndrome. Wear the wrist braces at nighttime and as often as possible during the day Aleve 2 tabs twice a day with food for pain and inflammation - take for 7 days then as needed. A prednisone dose pack is a consideration. Corticosteroid injection is a consideration to help with pain and inflammation If still not improving, will consider nerve conduction studies to assess severity. Follow up with me in 6 weeks.

## 2019-11-15 NOTE — Progress Notes (Signed)
PCP: Copland, Gay Filler, MD  Subjective:   HPI: Patient is a 44 y.o. female here for bilateral hand/wrist numbness.  Patient reports years ago she had problems with her shoulder where she saw [facility], did physical therapy for weakness, and had NCV/EMGs of upper extremity that were normal. She doesn't feel like she completely improved from that. Pain has been worse especially in past year. Primary complaint now is numbness and tingling in both hands, left worse than right. Wakes her up from sleep. Involves whole hand at times but usually occurs in 1st-3rd digits. Hard to hold things. Goes numb when driving. No radiation from neck into arms.  Past Medical History:  Diagnosis Date  . Allergic rhinitis   . History of chicken pox   . History of shingles   . Hyperlipidemia     Current Outpatient Medications on File Prior to Visit  Medication Sig Dispense Refill  . ALPRAZolam (XANAX) 0.25 MG tablet Take 1 tablet (0.25 mg total) by mouth 2 (two) times daily as needed for anxiety. 20 tablet 0  . cyclobenzaprine (FLEXERIL) 10 MG tablet Take 1 tablet (10 mg total) by mouth 3 (three) times daily as needed for muscle spasms. 30 tablet 1  . metFORMIN (GLUCOPHAGE) 500 MG tablet Take 1 tablet (500 mg total) by mouth daily with breakfast. 90 tablet 3  . Multiple Vitamin (MULTIVITAMIN) tablet Take 1 tablet by mouth daily.    . rosuvastatin (CRESTOR) 10 MG tablet Take 1 tablet (10 mg total) by mouth daily. 90 tablet 3  . venlafaxine XR (EFFEXOR-XR) 75 MG 24 hr capsule Take 1 capsule (75 mg total) by mouth daily with breakfast. 90 capsule 3  . SAXENDA 18 MG/3ML SOPN      No current facility-administered medications on file prior to visit.    Past Surgical History:  Procedure Laterality Date  . CHOLECYSTECTOMY    . HERNIA REPAIR    . wisdome teeth      Allergies  Allergen Reactions  . Sulfa Antibiotics     Childhood reaction    Social History   Socioeconomic History  .  Marital status: Married    Spouse name: Not on file  . Number of children: Not on file  . Years of education: Not on file  . Highest education level: Not on file  Occupational History  . Not on file  Tobacco Use  . Smoking status: Never Smoker  . Smokeless tobacco: Never Used  Substance and Sexual Activity  . Alcohol use: Yes    Comment: occ  . Drug use: No  . Sexual activity: Yes    Birth control/protection: I.U.D.  Other Topics Concern  . Not on file  Social History Narrative  . Not on file   Social Determinants of Health   Financial Resource Strain:   . Difficulty of Paying Living Expenses:   Food Insecurity:   . Worried About Charity fundraiser in the Last Year:   . Arboriculturist in the Last Year:   Transportation Needs:   . Film/video editor (Medical):   Marland Kitchen Lack of Transportation (Non-Medical):   Physical Activity:   . Days of Exercise per Week:   . Minutes of Exercise per Session:   Stress:   . Feeling of Stress :   Social Connections:   . Frequency of Communication with Friends and Family:   . Frequency of Social Gatherings with Friends and Family:   . Attends Religious Services:   .  Active Member of Clubs or Organizations:   . Attends Archivist Meetings:   Marland Kitchen Marital Status:   Intimate Partner Violence:   . Fear of Current or Ex-Partner:   . Emotionally Abused:   Marland Kitchen Physically Abused:   . Sexually Abused:     Family History  Problem Relation Age of Onset  . Hypertension Maternal Grandmother   . Hypertension Maternal Grandfather   . Hypertension Paternal Grandmother   . Heart disease Paternal Grandmother   . Hypertension Paternal Grandfather   . Heart disease Paternal Grandfather   . Anesthesia problems Neg Hx   . Hypotension Neg Hx   . Pseudochol deficiency Neg Hx   . Malignant hyperthermia Neg Hx     BP 118/74   Ht 5\' 1"  (1.549 m)   Wt 188 lb (85.3 kg)   BMI 35.52 kg/m   Review of Systems: See HPI above.     Objective:   Physical Exam:  Gen: NAD, comfortable in exam room  Neck: No gross deformity, swelling, bruising. TTP mildly bilateral trapezius muscles.  No midline/bony TTP. FROM without pain. BUE strength 5/5.   Sensation intact to light touch - maybe slightly decreased on right over 5th digit.   2+ equal reflexes in triceps, biceps, brachioradialis tendons. NV intact distal BUEs.  Bilateral hands/wrists: No deformity, atrophy. FROM with 5/5 strength. Mild tenderness to palpation over carpal tunnel. Some discomfort with tinels.  Negative tinels at cubital tunnel.  Positive phalens. NVI distally otherwise.   MSK u/s left median nerve 0.18cm2, right median nerve 0.17cm2  Assessment & Plan:  1. Bilateral carpal tunnel syndrome - confirmed with ultrasound.  Will start wrist braces at night and as often as possible during the day.  Aleve for 7 days then as needed.  Consider steroid dose pack or injection if not improving.  F/u in 6 weeks.

## 2019-11-27 MED FILL — METFORMIN HCL 500 MG TABS: 500 | 90 days supply | Qty: 90 | Fill #0

## 2019-12-11 MED FILL — VENLAFAXINE HCL ER 75 MG CA: 75 | 90 days supply | Qty: 90 | Fill #1

## 2019-12-20 ENCOUNTER — Other Ambulatory Visit: Payer: Self-pay

## 2019-12-20 DIAGNOSIS — M542 Cervicalgia: Secondary | ICD-10-CM

## 2019-12-20 NOTE — Telephone Encounter (Signed)
Jinny Blossom - can you put in an order for cervical spine radiographs at [facility] for [place]?  See our email discussion.  Let her know the order is in.  Thanks!

## 2019-12-22 NOTE — Addendum Note (Signed)
Addended by: Jolinda Croak E on: 12/22/2019 08:54 AM   Modules accepted: Orders

## 2019-12-27 ENCOUNTER — Ambulatory Visit: Payer: 59 | Admitting: Family Medicine

## 2019-12-28 ENCOUNTER — Encounter: Payer: Self-pay | Admitting: Family Medicine

## 2019-12-28 ENCOUNTER — Ambulatory Visit: Payer: 59 | Admitting: Family Medicine

## 2019-12-28 ENCOUNTER — Other Ambulatory Visit: Payer: Self-pay

## 2019-12-28 VITALS — BP 127/83 | HR 67 | Temp 98.3°F | Resp 16 | Ht 61.0 in | Wt 184.0 lb

## 2019-12-28 DIAGNOSIS — R110 Nausea: Secondary | ICD-10-CM

## 2019-12-28 DIAGNOSIS — E538 Deficiency of other specified B group vitamins: Secondary | ICD-10-CM

## 2019-12-28 DIAGNOSIS — R7303 Prediabetes: Secondary | ICD-10-CM | POA: Diagnosis not present

## 2019-12-28 DIAGNOSIS — E785 Hyperlipidemia, unspecified: Secondary | ICD-10-CM

## 2019-12-28 DIAGNOSIS — E119 Type 2 diabetes mellitus without complications: Secondary | ICD-10-CM | POA: Diagnosis not present

## 2019-12-28 DIAGNOSIS — H52223 Regular astigmatism, bilateral: Secondary | ICD-10-CM | POA: Diagnosis not present

## 2019-12-28 LAB — POCT URINE PREGNANCY: Preg Test, Ur: NEGATIVE

## 2019-12-28 LAB — HM DIABETES EYE EXAM

## 2019-12-28 NOTE — Patient Instructions (Signed)
It was good to see you again today- I will be in touch with your labs asap It is possible that metformin is causing your nausea and dizziness- you might try taking it at bedtime

## 2019-12-28 NOTE — Progress Notes (Addendum)
[facility] [address] [place], [place] [address] [phone]  Date:  12/28/2019   Name:  Tricia [name]   DOB:  1976/07/20   MRN:  [id number]  PCP:  [name], MD    Chief Complaint: Lab Work   History of Present Illness:  Tricia [name] is a 44 y.o. very pleasant female patient who presents with the following:  Patient with history of anxiety, here today for follow-up Last seen by myself in December for physical-virtual  She is married, has 3 school-aged children She had a near -syncopal episode last year where she fell and broke her hand, she has been to see cardiology and was cleared.  This is not occurred again However she does notice episodes where she may feel nauseated and dizzy, and her BP may be elevated to 130/90 Dizzy= vertigo  This occurred maybe 2 weeks ago She has an IUD so pregnancy is unlikely   Mammogram- scheduled for this year with GYN Pap- with her GYN, will be done this summer COVID-19 vaccine- not done as of yet encouraged her to get this done  Routine blood work was completed in [place] elevation of white cells, A1c 5.8, lipids elevated She was noted to have low B12 in December, I recommended oral B12 We started her on Crestor and also Metformin at that time, she hoped Metformin might help with some weight loss  She is taking b12 She is not aware of any particular SE from crestor/ metformin   Patient Active Problem List   Diagnosis Date Noted  . Near syncope 12/07/2018  . Nonspecific abnormal electrocardiogram (ECG) (EKG) 12/07/2018  . Closed fracture of base of fifth metacarpal bone of right hand with routine healing, subsequent encounter 12/06/2018  . Plantar fasciitis of right foot 09/28/2017  . GAD (generalized anxiety disorder) 05/19/2017  . Closed fracture of third toe of right foot, with nonunion, subsequent encounter 04/19/2017    Past Medical History:   Diagnosis Date  . Allergic rhinitis   . History of chicken pox   . History of shingles   . Hyperlipidemia     Past Surgical History:  Procedure Laterality Date  . CHOLECYSTECTOMY    . HERNIA REPAIR    . wisdome teeth      Social History   Tobacco Use  . Smoking status: Never Smoker  . Smokeless tobacco: Never Used  Substance Use Topics  . Alcohol use: Yes    Comment: occ  . Drug use: No    Family History  Problem Relation Age of Onset  . Hypertension Maternal Grandmother   . Hypertension Maternal Grandfather   . Hypertension Paternal Grandmother   . Heart disease Paternal Grandmother   . Hypertension Paternal Grandfather   . Heart disease Paternal Grandfather   . Anesthesia problems Neg Hx   . Hypotension Neg Hx   . Pseudochol deficiency Neg Hx   . Malignant hyperthermia Neg Hx     Allergies  Allergen Reactions  . Sulfa Antibiotics     Childhood reaction    Medication list has been reviewed and updated.  Current Outpatient Medications on File Prior to Visit  Medication Sig Dispense Refill  . ALPRAZolam (XANAX) 0.25 MG tablet Take 1 tablet (0.25 mg total) by mouth 2 (two) times daily as needed for anxiety. 20 tablet 0  . cyclobenzaprine (FLEXERIL) 10 MG tablet Take 1 tablet (10 mg total) by mouth 3 (  three) times daily as needed for muscle spasms. 30 tablet 1  . metFORMIN (GLUCOPHAGE) 500 MG tablet Take 1 tablet (500 mg total) by mouth daily with breakfast. 90 tablet 3  . Multiple Vitamin (MULTIVITAMIN) tablet Take 1 tablet by mouth daily.    . rosuvastatin (CRESTOR) 10 MG tablet Take 1 tablet (10 mg total) by mouth daily. 90 tablet 3  . venlafaxine XR (EFFEXOR-XR) 75 MG 24 hr capsule Take 1 capsule (75 mg total) by mouth daily with breakfast. 90 capsule 3   No current facility-administered medications on file prior to visit.    Review of Systems:  As per HPI- otherwise negative.   Physical Examination: Vitals:   12/28/19 1606  BP: 127/83  Pulse: 67   Resp: 16  Temp: 98.3 F (36.8 C)  SpO2: 100%   Vitals:   12/28/19 1606  Weight: 184 lb (83.5 kg)  Height: 5\' 1"  (1.549 m)   Body mass index is 34.77 kg/m. Ideal Body Weight: Weight in (lb) to have BMI = 25: 132  GEN: no acute distress.  Obese, looks well  HEENT: Atraumatic, Normocephalic.  Ears and Nose: No external deformity. CV: RRR, No M/G/R. No JVD. No thrill. No extra heart sounds. PULM: CTA B, no wheezes, crackles, rhonchi. No retractions. No resp. distress. No accessory muscle use. ABD: S, NT, ND, +BS. No rebound. No HSM. EXTR: No c/c/e PSYCH: Normally interactive. Conversant.   Results for orders placed or performed in visit on 12/28/19  POCT urine pregnancy  Result Value Ref Range   Preg Test, Ur Negative Negative    Assessment and Plan: Pre-diabetes - Plan: Basic metabolic panel, Hemoglobin A1c  Dyslipidemia - Plan: Lipid panel  B12 deficiency - Plan: B12  Nausea - Plan: POCT urine pregnancy   Patient here today for follow-up visit.  We started Metformin and Crestor for her several months ago due to prediabetes and dyslipidemia.  She is also taking a B12 supplement.  We will check her labs today, and I will be in touch with those results She has noted some issues with occasional vertigo and nausea.  hCG today is negative as expected.  I suggested that she try taking her Metformin at bedtime, as discussed possibly a side effect.  She will try this, and let me know if her symptoms continue  This visit occurred during the SARS-CoV-2 public health emergency.  Safety protocols were in place, including screening questions prior to the visit, additional usage of staff PPE, and extensive cleaning of exam room while observing appropriate contact time as indicated for disinfecting solutions.    Signed Lamar Blinks, MD  Received her labs as below 5/21- message to pt  Results for orders placed or performed in visit on [id number]  Basic metabolic panel  Result  Value Ref Range   Sodium 136 135 - 145 mEq/L   Potassium 4.1 3.5 - 5.1 mEq/L   Chloride 102 96 - 112 mEq/L   CO2 27 19 - 32 mEq/L   Glucose, Bld 132 (H) 70 - 99 mg/dL   BUN 10 6 - 23 mg/dL   Creatinine, Ser 0.79 0.40 - 1.20 mg/dL   GFR 79.58 >60.00 mL/min   Calcium 9.6 8.4 - 10.5 mg/dL  Hemoglobin A1c  Result Value Ref Range   Hgb A1c MFr Bld 5.8 4.6 - 6.5 %  Lipid panel  Result Value Ref Range   Cholesterol 165 0 - 200 mg/dL   Triglycerides 230.0 (H) 0.0 - 149.0 mg/dL  HDL 52.20 >39.00 mg/dL   VLDL 46.0 (H) 0.0 - 40.0 mg/dL   Total CHOL/HDL Ratio 3    NonHDL 112.30   B12  Result Value Ref Range   Vitamin B-12 244 211 - 911 pg/mL  LDL cholesterol, direct  Result Value Ref Range   Direct LDL 84.0 mg/dL  POCT urine pregnancy  Result Value Ref Range   Preg Test, Ur Negative Negative

## 2019-12-29 ENCOUNTER — Encounter: Payer: Self-pay | Admitting: Family Medicine

## 2019-12-29 ENCOUNTER — Ambulatory Visit
Admission: RE | Admit: 2019-12-29 | Discharge: 2019-12-29 | Disposition: A | Discharge status: Home / Self Care | Payer: 59 | Source: Ambulatory Visit | Attending: Family Medicine | Admitting: Family Medicine

## 2019-12-29 ENCOUNTER — Other Ambulatory Visit: Payer: Self-pay | Admitting: Family Medicine

## 2019-12-29 DIAGNOSIS — M542 Cervicalgia: Secondary | ICD-10-CM

## 2019-12-29 DIAGNOSIS — M546 Pain in thoracic spine: Secondary | ICD-10-CM | POA: Diagnosis not present

## 2019-12-29 LAB — BASIC METABOLIC PANEL
BUN: 10 mg/dL (ref 6–23)
CO2: 27 mEq/L (ref 19–32)
Calcium: 9.6 mg/dL (ref 8.4–10.5)
Chloride: 102 mEq/L (ref 96–112)
Creatinine, Ser: 0.79 mg/dL (ref 0.40–1.20)
GFR: 79.58 mL/min (ref >60.00)
Glucose, Bld: 132 mg/dL — ABNORMAL HIGH (ref 70–99)
Potassium: 4.1 mEq/L (ref 3.5–5.1)
Sodium: 136 mEq/L (ref 135–145)

## 2019-12-29 LAB — LIPID PANEL
Cholesterol: 165 mg/dL (ref 0–200)
HDL: 52.2 mg/dL (ref >39.00)
NonHDL: 112.3
Total CHOL/HDL Ratio: 3
Triglycerides: 230 mg/dL — ABNORMAL HIGH (ref 0.0–149.0)
VLDL: 46 mg/dL — ABNORMAL HIGH (ref 0.0–40.0)

## 2019-12-29 LAB — LDL CHOLESTEROL, DIRECT: Direct LDL: 84 mg/dL

## 2019-12-29 LAB — HEMOGLOBIN A1C: Hgb A1c MFr Bld: 5.8 % (ref 4.6–6.5)

## 2019-12-29 LAB — VITAMIN B12: Vitamin B-12: 244 pg/mL (ref 211–911)

## 2020-03-28 ENCOUNTER — Encounter: Payer: Self-pay | Admitting: Family Medicine

## 2020-03-29 MED ORDER — SAXENDA 18 MG/3ML ~~LOC~~ SOPN: PEN_INJECTOR | SUBCUTANEOUS | 2 refills | Status: DC

## 2020-03-29 MED ORDER — PEN NEEDLES 32G X 5 MM MISC: 2 refills | Status: DC

## 2020-04-02 ENCOUNTER — Telehealth: Payer: Self-pay

## 2020-04-02 NOTE — Telephone Encounter (Signed)
PA initiated via Covermymeds; KEY: [id number]. Awaiting determination.

## 2020-04-03 NOTE — Telephone Encounter (Signed)
PA approved.   The request has been approved. The authorization is effective for a maximum of 4 fills from 04/03/2020 to 08/02/2020, as long as the member is enrolled in their current health plan. The request was approved as submitted. This request has been approved for 24mL per 30 days. Renewal for Saxenda requires that the patient has achieved or maintained at least 4% weight loss of baseline body weight after 4 months of treatment. A written notification letter will follow with additional details.

## 2020-04-08 MED FILL — SAXENDA 18 MG/3 ML PEN: 18 | 44 days supply | Qty: 15 | Fill #0

## 2020-04-23 ENCOUNTER — Encounter: Payer: Self-pay | Admitting: Family Medicine

## 2020-05-17 DIAGNOSIS — Z1231 Encounter for screening mammogram for malignant neoplasm of breast: Secondary | ICD-10-CM | POA: Diagnosis not present

## 2020-05-17 DIAGNOSIS — Z01419 Encounter for gynecological examination (general) (routine) without abnormal findings: Secondary | ICD-10-CM | POA: Diagnosis not present

## 2020-05-17 DIAGNOSIS — Z6833 Body mass index (BMI) 33.0-33.9, adult: Secondary | ICD-10-CM | POA: Diagnosis not present

## 2020-05-17 LAB — HM MAMMOGRAPHY

## 2020-05-21 ENCOUNTER — Telehealth: Payer: Self-pay | Admitting: Family Medicine

## 2020-05-21 NOTE — Telephone Encounter (Signed)
Recv'd records from [facility] forwarded 2 pages to Dr. Janett Billow Copland 10/12/21fbg

## 2020-05-31 ENCOUNTER — Encounter: Payer: Self-pay | Admitting: Family Medicine

## 2020-06-25 ENCOUNTER — Encounter: Payer: Self-pay | Admitting: Family Medicine

## 2020-06-25 DIAGNOSIS — R1031 Right lower quadrant pain: Secondary | ICD-10-CM | POA: Diagnosis not present

## 2020-06-26 NOTE — Telephone Encounter (Signed)
Received GYN records from Dr. Roxine Caddy dated 11/16.  Physical exam was normal.  Transvaginal ultrasound showed IUD in appropriate position, no abnormalities

## 2020-07-22 ENCOUNTER — Telehealth: Payer: Self-pay

## 2020-07-22 NOTE — Telephone Encounter (Signed)
Appt scheduled 07/25/2020.

## 2020-07-22 NOTE — Telephone Encounter (Signed)
Received a PA request for continuation of therapy. We don't have an updated weight for her since starting therapy. Can you contact her?

## 2020-07-22 NOTE — Telephone Encounter (Signed)
Sent patient mychart message

## 2020-07-25 ENCOUNTER — Other Ambulatory Visit: Payer: Self-pay | Admitting: Family Medicine

## 2020-07-25 ENCOUNTER — Other Ambulatory Visit: Payer: Self-pay

## 2020-07-25 ENCOUNTER — Encounter: Payer: Self-pay | Admitting: Family Medicine

## 2020-07-25 ENCOUNTER — Ambulatory Visit: Payer: 59 | Admitting: Family Medicine

## 2020-07-25 VITALS — BP 126/84 | HR 89 | Resp 17 | Ht 61.0 in | Wt 181.0 lb

## 2020-07-25 DIAGNOSIS — L02419 Cutaneous abscess of limb, unspecified: Secondary | ICD-10-CM | POA: Diagnosis not present

## 2020-07-25 DIAGNOSIS — E785 Hyperlipidemia, unspecified: Secondary | ICD-10-CM

## 2020-07-25 MED ORDER — CEPHALEXIN 500 MG PO CAPS: 500.0000 mg | ORAL_CAPSULE | Freq: Three times a day (TID) | ORAL | 0 refills | Status: DC

## 2020-07-25 MED ORDER — SIMVASTATIN 20 MG PO TABS: 20.0000 mg | ORAL_TABLET | Freq: Every day | ORAL | 3 refills | Status: DC

## 2020-07-25 MED FILL — SIMVASTATIN 20 MG TABLET: 20 | 90 days supply | Qty: 90 | Fill #0

## 2020-07-25 NOTE — Telephone Encounter (Signed)
PA initiated via Covermymeds; KEY: [id number]. Awaiting determination.

## 2020-07-25 NOTE — Progress Notes (Signed)
[facility] [address], [place], [place] [address] [phone]  Date:  07/25/2020   Name:  Tricia [name]   DOB:  1976-06-15   MRN:  [id number]  PCP:  [name], MD    Chief Complaint: Skin lesion (Right arm, cyst, flares off and on/)   History of Present Illness:  Tricia [name] is a 44 y.o. very pleasant female patient who presents with the following:  Patient here today to discuss a cyst on her right arm I last saw her in May of this year History of anxiety, dyslipidemia, prediabetes She is using saxenda and does fele like it is helping her - she is down a few lbs -we were able to get approval for continued use of this medication through insurance  Flu vaccine- done end of October COVID-19 vaccine- she had this done  Pap- UTD per her GYN mammo also UTD Hepatitis C screening can be done with next labs  She had not taken her crestor in a whiel due to posisble SE- she would be willing to try a differnet medication.  I will prescribe simvastatin for her instead  She noted a tender, possible cyst on the right forearm over the summer.  it got better, but then came back over the last week or so.  It has been quite tender and swollen, she thinks there may be an infection Wt Readings from Last 3 Encounters:  07/25/20 181 lb (82.1 kg)  12/28/19 184 lb (83.5 kg)  11/15/19 188 lb (85.3 kg)    Patient Active Problem List   Diagnosis Date Noted  . Near syncope 12/07/2018  . Nonspecific abnormal electrocardiogram (ECG) (EKG) 12/07/2018  . Closed fracture of base of fifth metacarpal bone of right hand with routine healing, subsequent encounter 12/06/2018  . Plantar fasciitis of right foot 09/28/2017  . GAD (generalized anxiety disorder) 05/19/2017  . Closed fracture of third toe of right foot, with nonunion, subsequent encounter 04/19/2017    Past Medical History:  Diagnosis Date  . Allergic rhinitis    . History of chicken pox   . History of shingles   . Hyperlipidemia     Past Surgical History:  Procedure Laterality Date  . CHOLECYSTECTOMY    . HERNIA REPAIR    . wisdome teeth      Social History   Tobacco Use  . Smoking status: Never Smoker  . Smokeless tobacco: Never Used  Vaping Use  . Vaping Use: Never used  Substance Use Topics  . Alcohol use: Yes    Comment: occ  . Drug use: No    Family History  Problem Relation Age of Onset  . Hypertension Maternal Grandmother   . Hypertension Maternal Grandfather   . Hypertension Paternal Grandmother   . Heart disease Paternal Grandmother   . Hypertension Paternal Grandfather   . Heart disease Paternal Grandfather   . Anesthesia problems Neg Hx   . Hypotension Neg Hx   . Pseudochol deficiency Neg Hx   . Malignant hyperthermia Neg Hx     Allergies  Allergen Reactions  . Sulfa Antibiotics     Childhood reaction    Medication list has been reviewed and updated.  Current Outpatient Medications on File Prior to Visit  Medication Sig Dispense Refill  . ALPRAZolam (XANAX) 0.25 MG tablet Take 1 tablet (0.25 mg total) by mouth 2 (two) times daily as needed for anxiety. 20 tablet 0  .  cyclobenzaprine (FLEXERIL) 10 MG tablet Take 1 tablet (10 mg total) by mouth 3 (three) times daily as needed for muscle spasms. 30 tablet 1  . Insulin Pen Needle (PEN NEEDLES) 32G X 5 MM MISC Use one daily with medication injection 100 each 2  . Liraglutide -Weight Management (SAXENDA) 18 MG/3ML SOPN Start with 0.6 mg  daily for one week.  Increase by 0.6 mg/ week until goal dose of 3mg /day. 15 mL 2  . metFORMIN (GLUCOPHAGE) 500 MG tablet Take 1 tablet (500 mg total) by mouth daily with breakfast. 90 tablet 3  . Multiple Vitamin (MULTIVITAMIN) tablet Take 1 tablet by mouth daily.    . rosuvastatin (CRESTOR) 10 MG tablet Take 1 tablet (10 mg total) by mouth daily. (Patient not taking: Reported on 07/25/2020) 90 tablet 3  . venlafaxine XR  (EFFEXOR-XR) 75 MG 24 hr capsule Take 1 capsule (75 mg total) by mouth daily with breakfast. (Patient not taking: Reported on 07/25/2020) 90 capsule 3   No current facility-administered medications on file prior to visit.    Review of Systems:  As per HPI- otherwise negative.   Physical Examination: Vitals:   07/25/20 1404  BP: 126/84  Pulse: 89  Resp: 17  SpO2: 99%   Vitals:   07/25/20 1404  Weight: 181 lb (82.1 kg)  Height: 5\' 1"  (1.549 m)   Body mass index is 34.2 kg/m. Ideal Body Weight: Weight in (lb) to have BMI = 25: 132  GEN: no acute distress. HEENT: Atraumatic, Normocephalic.  Ears and Nose: No external deformity. CV: RRR, No M/G/R. No JVD. No thrill. No extra heart sounds. PULM: CTA B, no wheezes, crackles, rhonchi. No retractions. No resp. distress. No accessory muscle use. ABD: S, NT, ND, +BS. No rebound. No HSM. EXTR: No c/c/e PSYCH: Normally interactive. Conversant.  The ventral and lateral aspects of her right forearm displays a small abscess, the center is fluctuant and it is tender.  She would be amenable to office I&D.  Abscess is approximately 1 cm in diameter  Discussed procedure with patient, she gives consent to proceed.  Area is prepped with Betadine and alcohol swabs.  Used approximately 1 cc of of 1% lidocaine with epinephrine for anesthesia.  Create a small incision, approximately half centimeter with an 11 blade, expressed pus.  Bandaged with a Band-Aid  Patient tolerated well, minimal estimated blood loss-less than 5 mL  Assessment and Plan: Abscess of arm - Plan: cephALEXin (KEFLEX) 500 MG capsule  Dyslipidemia - Plan: simvastatin (ZOCOR) 20 MG tablet  Patient here today with a small cyst of her arm.  Treated with I&D as above.  Discussed wound care and also gave written instructions.  I advised her that since we have drained the abscess and antibiotic may not be necessary.  However, I will prescribe her with a pain prescription in case  infection is apparent.  We will also have her try simvastatin for cholesterol control in case she may tolerate this better This visit occurred during the SARS-CoV-2 public health emergency.  Safety protocols were in place, including screening questions prior to the visit, additional usage of staff PPE, and extensive cleaning of exam room while observing appropriate contact time as indicated for disinfecting solutions.    Signed Lamar Blinks, MD

## 2020-07-25 NOTE — Patient Instructions (Signed)
It was good to see you again today.  I sent in a prescription for simvastatin for you to try for cholesterol instead of Crestor, this may have fewer side effects for you.  We drained an abscess on your right arm today, typically getting the pus out will help the infection to resolve.  However, I gave you prescription for Keflex to hold.  Fill this if you suspect the area is not healing as quickly as we would like  Keep the area clean and dry for 24 hours, then you can shower as usual.  Keep a Band-Aid over the area until it has healed over.  It may continue to drain some pus the next day or 2.  If any bleeding, apply firm pressure for 15 to 20 minutes.  If this fails to resolve bleeding please let me know

## 2020-07-25 NOTE — Telephone Encounter (Signed)
PA approved.   The request has been approved. The authorization is effective for a maximum of 12 fills from 07/25/2020 to 07/24/2021, as long as the member is enrolled in their current health plan. This has been approved for a quantity limit of 15.0 with a day supply limit of 30.0. A written notification letter will follow with additional details.

## 2020-07-25 NOTE — Telephone Encounter (Signed)
Patient in office today for visit She weighed 181lb. She did state she has only truly been taking this medication for about 3 weeks now. Please update PA

## 2020-08-05 MED FILL — SAXENDA 18 MG/3 ML PEN: 18 | 30 days supply | Qty: 15 | Fill #1

## 2020-09-18 MED FILL — SAXENDA 18 MG/3 ML PEN: 18 | 30 days supply | Qty: 15 | Fill #2

## 2020-10-29 ENCOUNTER — Other Ambulatory Visit: Payer: Self-pay | Admitting: Family Medicine

## 2020-10-30 ENCOUNTER — Encounter: Payer: Self-pay | Admitting: Family Medicine

## 2020-10-30 ENCOUNTER — Other Ambulatory Visit: Payer: Self-pay | Admitting: Family Medicine

## 2020-10-30 MED ORDER — SAXENDA 18 MG/3ML ~~LOC~~ SOPN: PEN_INJECTOR | SUBCUTANEOUS | 2 refills | Status: DC

## 2020-10-30 NOTE — Telephone Encounter (Signed)
Patient states she is taking 3 mg

## 2020-10-30 NOTE — Telephone Encounter (Signed)
Pt was last seen on 07/25/20.   Okay to refill injection?

## 2020-10-31 MED FILL — SAXENDA 18 MG/3 ML PEN: 18 | 30 days supply | Qty: 15 | Fill #0

## 2020-11-08 NOTE — Patient Instructions (Addendum)
Good to see you today- take care  I will be in touch with your labs asap  Try taking a daily stool softener- let me know if you continue to have GI concerns.  We can have you see GI or do a CT scan of your abd/ pelvis if you like  Continue to work on weight loss and exercise- you are doing great!   Health Maintenance, Female Adopting a healthy lifestyle and getting preventive care are important in promoting health and wellness. Ask your health care provider about:  The right schedule for you to have regular tests and exams.  Things you can do on your own to prevent diseases and keep yourself healthy. What should I know about diet, weight, and exercise? Eat a healthy diet  Eat a diet that includes plenty of vegetables, fruits, low-fat dairy products, and lean protein.  Do not eat a lot of foods that are high in solid fats, added sugars, or sodium.   Maintain a healthy weight Body mass index (BMI) is used to identify weight problems. It estimates body fat based on height and weight. Your health care provider can help determine your BMI and help you achieve or maintain a healthy weight. Get regular exercise Get regular exercise. This is one of the most important things you can do for your health. Most adults should:  Exercise for at least 150 minutes each week. The exercise should increase your heart rate and make you sweat (moderate-intensity exercise).  Do strengthening exercises at least twice a week. This is in addition to the moderate-intensity exercise.  Spend less time sitting. Even light physical activity can be beneficial. Watch cholesterol and blood lipids Have your blood tested for lipids and cholesterol at 44 years of age, then have this test every 5 years. Have your cholesterol levels checked more often if:  Your lipid or cholesterol levels are high.  You are older than 44 years of age.  You are at high risk for heart disease. What should I know about cancer  screening? Depending on your health history and family history, you may need to have cancer screening at various ages. This may include screening for:  Breast cancer.  Cervical cancer.  Colorectal cancer.  Skin cancer.  Lung cancer. What should I know about heart disease, diabetes, and high blood pressure? Blood pressure and heart disease  High blood pressure causes heart disease and increases the risk of stroke. This is more likely to develop in people who have high blood pressure readings, are of African descent, or are overweight.  Have your blood pressure checked: ? Every 3-5 years if you are 25-23 years of age. ? Every year if you are 49 years old or older. Diabetes Have regular diabetes screenings. This checks your fasting blood sugar level. Have the screening done:  Once every three years after age 28 if you are at a normal weight and have a low risk for diabetes.  More often and at a younger age if you are overweight or have a high risk for diabetes. What should I know about preventing infection? Hepatitis B If you have a higher risk for hepatitis B, you should be screened for this virus. Talk with your health care provider to find out if you are at risk for hepatitis B infection. Hepatitis C Testing is recommended for:  Everyone born from [birth year] through 1964.  Anyone with known risk factors for hepatitis C. Sexually transmitted infections (STIs)  Get screened for STIs, including gonorrhea  and chlamydia, if: ? You are sexually active and are younger than 44 years of age. ? You are older than 44 years of age and your health care provider tells you that you are at risk for this type of infection. ? Your sexual activity has changed since you were last screened, and you are at increased risk for chlamydia or gonorrhea. Ask your health care provider if you are at risk.  Ask your health care provider about whether you are at high risk for HIV. Your health care provider may  recommend a prescription medicine to help prevent HIV infection. If you choose to take medicine to prevent HIV, you should first get tested for HIV. You should then be tested every 3 months for as long as you are taking the medicine. Pregnancy  If you are about to stop having your period (premenopausal) and you may become pregnant, seek counseling before you get pregnant.  Take 400 to 800 micrograms (mcg) of folic acid every day if you become pregnant.  Ask for birth control (contraception) if you want to prevent pregnancy. Osteoporosis and menopause Osteoporosis is a disease in which the bones lose minerals and strength with aging. This can result in bone fractures. If you are 39 years old or older, or if you are at risk for osteoporosis and fractures, ask your health care provider if you should:  Be screened for bone loss.  Take a calcium or vitamin D supplement to lower your risk of fractures.  Be given hormone replacement therapy (HRT) to treat symptoms of menopause. Follow these instructions at home: Lifestyle  Do not use any products that contain nicotine or tobacco, such as cigarettes, e-cigarettes, and chewing tobacco. If you need help quitting, ask your health care provider.  Do not use street drugs.  Do not share needles.  Ask your health care provider for help if you need support or information about quitting drugs. Alcohol use  Do not drink alcohol if: ? Your health care provider tells you not to drink. ? You are pregnant, may be pregnant, or are planning to become pregnant.  If you drink alcohol: ? Limit how much you use to 0-1 drink a day. ? Limit intake if you are breastfeeding.  Be aware of how much alcohol is in your drink. In the [place] one drink equals one 12 oz bottle of beer (355 mL), one 5 oz glass of wine (148 mL), or one 1 oz glass of hard liquor (44 mL). General instructions  Schedule regular health, dental, and eye exams.  Stay current with your  vaccines.  Tell your health care provider if: ? You often feel depressed. ? You have ever been abused or do not feel safe at home. Summary  Adopting a healthy lifestyle and getting preventive care are important in promoting health and wellness.  Follow your health care provider's instructions about healthy diet, exercising, and getting tested or screened for diseases.  Follow your health care provider's instructions on monitoring your cholesterol and blood pressure. This information is not intended to replace advice given to you by your health care provider. Make sure you discuss any questions you have with your health care provider. Document Revised: 07/20/2018 Document Reviewed: 07/20/2018 Elsevier Patient Education  2021 [employer].

## 2020-11-08 NOTE — Progress Notes (Addendum)
[facility] [address] [place], [place] [address] [phone]  Date:  11/18/2020   Name:  Tricia [name]   DOB:  1975-11-15   MRN:  [id number]  PCP:  [name], MD    Chief Complaint: Anxiety and Pre-Diabetes   History of Present Illness:  Tricia [name] is a 45 y.o. very pleasant female patient who presents with the following:  Patient here today for a follow-up visit/ physical exam  Last seen by myself in December - History of anxiety, dyslipidemia, prediabetes Most recent labs about one year ago   covid vaccine- done!  Pap now due - she does see GYN and thinks this is actually UTD  She is on Saxenda - this is working well for her but she is having constipation, and then she may finally pass a runny stool after several days of no bowel movement.  She has tried using MiraLAX off and on Her bowels have been more unpredictable since she had her GB removed a few  Wt Readings from Last 3 Encounters:  11/18/20 177 lb (80.3 kg)  07/25/20 181 lb (82.1 kg)  12/28/19 184 lb (83.5 kg)   She does use flexeril as needed, does not need a refill right now zocor at bedtime  She is no longer using effexor and doing well without it   She is trying to be more active outdoors- does not get a lot of formal exercise however  Never smoker, occasional alcohol  Patient Active Problem List   Diagnosis Date Noted  . Near syncope 12/07/2018  . Nonspecific abnormal electrocardiogram (ECG) (EKG) 12/07/2018  . Closed fracture of base of fifth metacarpal bone of right hand with routine healing, subsequent encounter 12/06/2018  . Plantar fasciitis of right foot 09/28/2017  . GAD (generalized anxiety disorder) 05/19/2017  . Closed fracture of third toe of right foot, with nonunion, subsequent encounter 04/19/2017    Past Medical History:  Diagnosis Date  . Allergic rhinitis   . History of chicken pox   . History of  shingles   . Hyperlipidemia     Past Surgical History:  Procedure Laterality Date  . CHOLECYSTECTOMY    . HERNIA REPAIR    . wisdome teeth      Social History   Tobacco Use  . Smoking status: Never Smoker  . Smokeless tobacco: Never Used  Vaping Use  . Vaping Use: Never used  Substance Use Topics  . Alcohol use: Yes    Comment: occ  . Drug use: No    Family History  Problem Relation Age of Onset  . Hypertension Maternal Grandmother   . Hypertension Maternal Grandfather   . Hypertension Paternal Grandmother   . Heart disease Paternal Grandmother   . Hypertension Paternal Grandfather   . Heart disease Paternal Grandfather   . Anesthesia problems Neg Hx   . Hypotension Neg Hx   . Pseudochol deficiency Neg Hx   . Malignant hyperthermia Neg Hx     Allergies  Allergen Reactions  . Sulfa Antibiotics     Childhood reaction    Medication list has been reviewed and updated.  Current Outpatient Medications on File Prior to Visit  Medication Sig Dispense Refill  . cyclobenzaprine (FLEXERIL) 10 MG tablet Take 1 tablet (10 mg total) by mouth 3 (three) times daily as needed for muscle spasms. 30 tablet 1  . famotidine (PEPCID) 40 MG tablet Take 40 mg  by mouth daily.    . Insulin Pen Needle (PEN NEEDLES) 32G X 5 MM MISC Use one daily with medication injection 100 each 2  . Liraglutide -Weight Management 18 MG/3ML SOPN INJECT 3 MG UNDER THE SKIN DAILY 15 mL 2  . Multiple Vitamin (MULTIVITAMIN) tablet Take 1 tablet by mouth daily.    . Probiotic Product (PRO-BIOTIC BLEND PO) Take by mouth.    . simvastatin (ZOCOR) 20 MG tablet TAKE 1 TABLET BY MOUTH AT BEDTIME 90 tablet 3  . venlafaxine XR (EFFEXOR-XR) 75 MG 24 hr capsule Take 1 capsule (75 mg total) by mouth daily with breakfast. (Patient not taking: No sig reported) 90 capsule 3   No current facility-administered medications on file prior to visit.    Review of Systems:  As per HPI- otherwise negative.   Physical  Examination: Vitals:   11/18/20 0935  BP: 108/64  Pulse: 77  Resp: 16  Temp: 97.9 F (36.6 C)  SpO2: 97%   Vitals:   11/18/20 0935  Weight: 177 lb (80.3 kg)  Height: 5\' 1"  (1.549 m)   Body mass index is 33.44 kg/m. Ideal Body Weight: Weight in (lb) to have BMI = 25: 132  GEN: no acute distress.  Looks well, obese but has lost weight HEENT: Atraumatic, Normocephalic.  Ears and Nose: No external deformity. CV: RRR, No M/G/R. No JVD. No thrill. No extra heart sounds. PULM: CTA B, no wheezes, crackles, rhonchi. No retractions. No resp. distress. No accessory muscle use. ABD: S, NT, ND, +BS. No rebound. No HSM. EXTR: No c/c/e PSYCH: Normally interactive. Conversant.    Assessment and Plan: Physical exam  Dyslipidemia - Plan: Lipid panel  B12 deficiency - Plan: Vitamin B12  Screening for deficiency anemia - Plan: CBC  Pre-diabetes - Plan: Comprehensive metabolic panel, Hemoglobin A1c  Fatigue, unspecified type - Plan: TSH, VITAMIN D 25 Hydroxy (Vit-D Deficiency, Fractures)  Encounter for hepatitis C screening test for low risk patient - Plan: Hepatitis C antibody  Following up today/ physical exam  Encouraged continued healthy diet and physical activity.  She is using Saxenda for weight loss, continue this medication.  I have asked her to follow-up in 4 months to monitor her weight Went over health maintenance Will plan further follow- up pending labs.  This visit occurred during the SARS-CoV-2 public health emergency.  Safety protocols were in place, including screening questions prior to the visit, additional usage of staff PPE, and extensive cleaning of exam room while observing appropriate contact time as indicated for disinfecting solutions.    Signed Lamar Blinks, MD  Received her labs as below, message to pt  Results for orders placed or performed in visit on 11/18/20  CBC  Result Value Ref Range   WBC 11.3 (H) 4.0 - 10.5 K/uL   RBC 4.60 3.87 - 5.11  Mil/uL   Platelets 326.0 150.0 - 400.0 K/uL   Hemoglobin 15.0 12.0 - 15.0 g/dL   HCT 44.7 36.0 - 46.0 %   MCV 97.1 78.0 - 100.0 fl   MCHC 33.5 30.0 - 36.0 g/dL   RDW 13.2 11.5 - 15.5 %  Comprehensive metabolic panel  Result Value Ref Range   Sodium 138 135 - 145 mEq/L   Potassium 4.5 3.5 - 5.1 mEq/L   Chloride 103 96 - 112 mEq/L   CO2 28 19 - 32 mEq/L   Glucose, Bld 78 70 - 99 mg/dL   BUN 12 6 - 23 mg/dL   Creatinine, Ser 0.77 0.40 -  1.20 mg/dL   Total Bilirubin 0.6 0.2 - 1.2 mg/dL   Alkaline Phosphatase 82 39 - 117 U/L   AST 18 0 - 37 U/L   ALT 14 0 - 35 U/L   Total Protein 7.3 6.0 - 8.3 g/dL   Albumin 4.2 3.5 - 5.2 g/dL   GFR 94.42 >60.00 mL/min   Calcium 9.8 8.4 - 10.5 mg/dL  Hemoglobin A1c  Result Value Ref Range   Hgb A1c MFr Bld 5.5 4.6 - 6.5 %  Lipid panel  Result Value Ref Range   Cholesterol 184 0 - 200 mg/dL   Triglycerides 168.0 (H) 0.0 - 149.0 mg/dL   HDL 51.40 >39.00 mg/dL   VLDL 33.6 0.0 - 40.0 mg/dL   LDL Cholesterol 99 0 - 99 mg/dL   Total CHOL/HDL Ratio 4    NonHDL 132.42   TSH  Result Value Ref Range   TSH 1.29 0.35 - 4.50 uIU/mL  VITAMIN D 25 Hydroxy (Vit-D Deficiency, Fractures)  Result Value Ref Range   VITD 20.91 (L) 30.00 - 100.00 ng/mL  Vitamin B12  Result Value Ref Range   Vitamin B-12 181 (L) 211 - 911 pg/mL

## 2020-11-18 ENCOUNTER — Ambulatory Visit: Payer: 59 | Admitting: Family Medicine

## 2020-11-18 ENCOUNTER — Encounter: Payer: Self-pay | Admitting: Family Medicine

## 2020-11-18 ENCOUNTER — Other Ambulatory Visit: Payer: Self-pay

## 2020-11-18 VITALS — BP 108/64 | HR 77 | Temp 97.9°F | Resp 16 | Ht 61.0 in | Wt 177.0 lb

## 2020-11-18 DIAGNOSIS — Z130 Encounter for screening for diseases of the blood and blood-forming organs and certain disorders involving the immune mechanism: Secondary | ICD-10-CM | POA: Diagnosis not present

## 2020-11-18 DIAGNOSIS — E538 Deficiency of other specified B group vitamins: Secondary | ICD-10-CM

## 2020-11-18 DIAGNOSIS — E785 Hyperlipidemia, unspecified: Secondary | ICD-10-CM | POA: Diagnosis not present

## 2020-11-18 DIAGNOSIS — Z0000 Encounter for general adult medical examination without abnormal findings: Secondary | ICD-10-CM

## 2020-11-18 DIAGNOSIS — R5383 Other fatigue: Secondary | ICD-10-CM | POA: Diagnosis not present

## 2020-11-18 DIAGNOSIS — R7303 Prediabetes: Secondary | ICD-10-CM

## 2020-11-18 DIAGNOSIS — E559 Vitamin D deficiency, unspecified: Secondary | ICD-10-CM | POA: Diagnosis not present

## 2020-11-18 DIAGNOSIS — Z1159 Encounter for screening for other viral diseases: Secondary | ICD-10-CM

## 2020-11-18 LAB — LIPID PANEL
Cholesterol: 184 mg/dL (ref 0–200)
HDL: 51.4 mg/dL (ref >39.00)
LDL Cholesterol: 99 mg/dL (ref 0–99)
NonHDL: 132.42
Total CHOL/HDL Ratio: 4
Triglycerides: 168 mg/dL — ABNORMAL HIGH (ref 0.0–149.0)
VLDL: 33.6 mg/dL (ref 0.0–40.0)

## 2020-11-18 LAB — COMPREHENSIVE METABOLIC PANEL
ALT: 14 U/L (ref 0–35)
AST: 18 U/L (ref 0–37)
Albumin: 4.2 g/dL (ref 3.5–5.2)
Alkaline Phosphatase: 82 U/L (ref 39–117)
BUN: 12 mg/dL (ref 6–23)
CO2: 28 mEq/L (ref 19–32)
Calcium: 9.8 mg/dL (ref 8.4–10.5)
Chloride: 103 mEq/L (ref 96–112)
Creatinine, Ser: 0.77 mg/dL (ref 0.40–1.20)
GFR: 94.42 mL/min (ref >60.00)
Glucose, Bld: 78 mg/dL (ref 70–99)
Potassium: 4.5 mEq/L (ref 3.5–5.1)
Sodium: 138 mEq/L (ref 135–145)
Total Bilirubin: 0.6 mg/dL (ref 0.2–1.2)
Total Protein: 7.3 g/dL (ref 6.0–8.3)

## 2020-11-18 LAB — CBC
HCT: 44.7 % (ref 36.0–46.0)
Hemoglobin: 15 g/dL (ref 12.0–15.0)
MCHC: 33.5 g/dL (ref 30.0–36.0)
MCV: 97.1 fl (ref 78.0–100.0)
Platelets: 326 10*3/uL (ref 150.0–400.0)
RBC: 4.6 Mil/uL (ref 3.87–5.11)
RDW: 13.2 % (ref 11.5–15.5)
WBC: 11.3 10*3/uL — ABNORMAL HIGH (ref 4.0–10.5)

## 2020-11-18 LAB — VITAMIN B12: Vitamin B-12: 181 pg/mL — ABNORMAL LOW (ref 211–911)

## 2020-11-18 LAB — VITAMIN D 25 HYDROXY (VIT D DEFICIENCY, FRACTURES): VITD: 20.91 ng/mL — ABNORMAL LOW (ref 30.00–100.00)

## 2020-11-18 LAB — HEMOGLOBIN A1C: Hgb A1c MFr Bld: 5.5 % (ref 4.6–6.5)

## 2020-11-18 LAB — TSH: TSH: 1.29 u[IU]/mL (ref 0.35–4.50)

## 2020-11-18 MED ORDER — VITAMIN D3 1.25 MG (50000 UT) PO CAPS
ORAL_CAPSULE | ORAL | 0 refills | Status: DC
  Filled 2020-11-18: qty 12, 84d supply, fill #0

## 2020-11-18 NOTE — Addendum Note (Signed)
Addended by: Lamar Blinks C on: 11/18/2020 09:06 PM   Modules accepted: Orders

## 2020-11-19 ENCOUNTER — Other Ambulatory Visit (HOSPITAL_COMMUNITY): Payer: Self-pay

## 2020-11-19 LAB — HEPATITIS C ANTIBODY
Hepatitis C Ab: NONREACTIVE
SIGNAL TO CUT-OFF: 0.01 (ref <1.00)

## 2020-11-21 IMAGING — DX RIGHT HAND - COMPLETE 3+ VIEW
3 series · 3 of 3 positions shown · non-contrast
Comparison: None.

CLINICAL DATA: Recent syncopal episode with hand pain, initial
encounter

EXAM:
RIGHT HAND - COMPLETE 3+ VIEW

[hand pa]
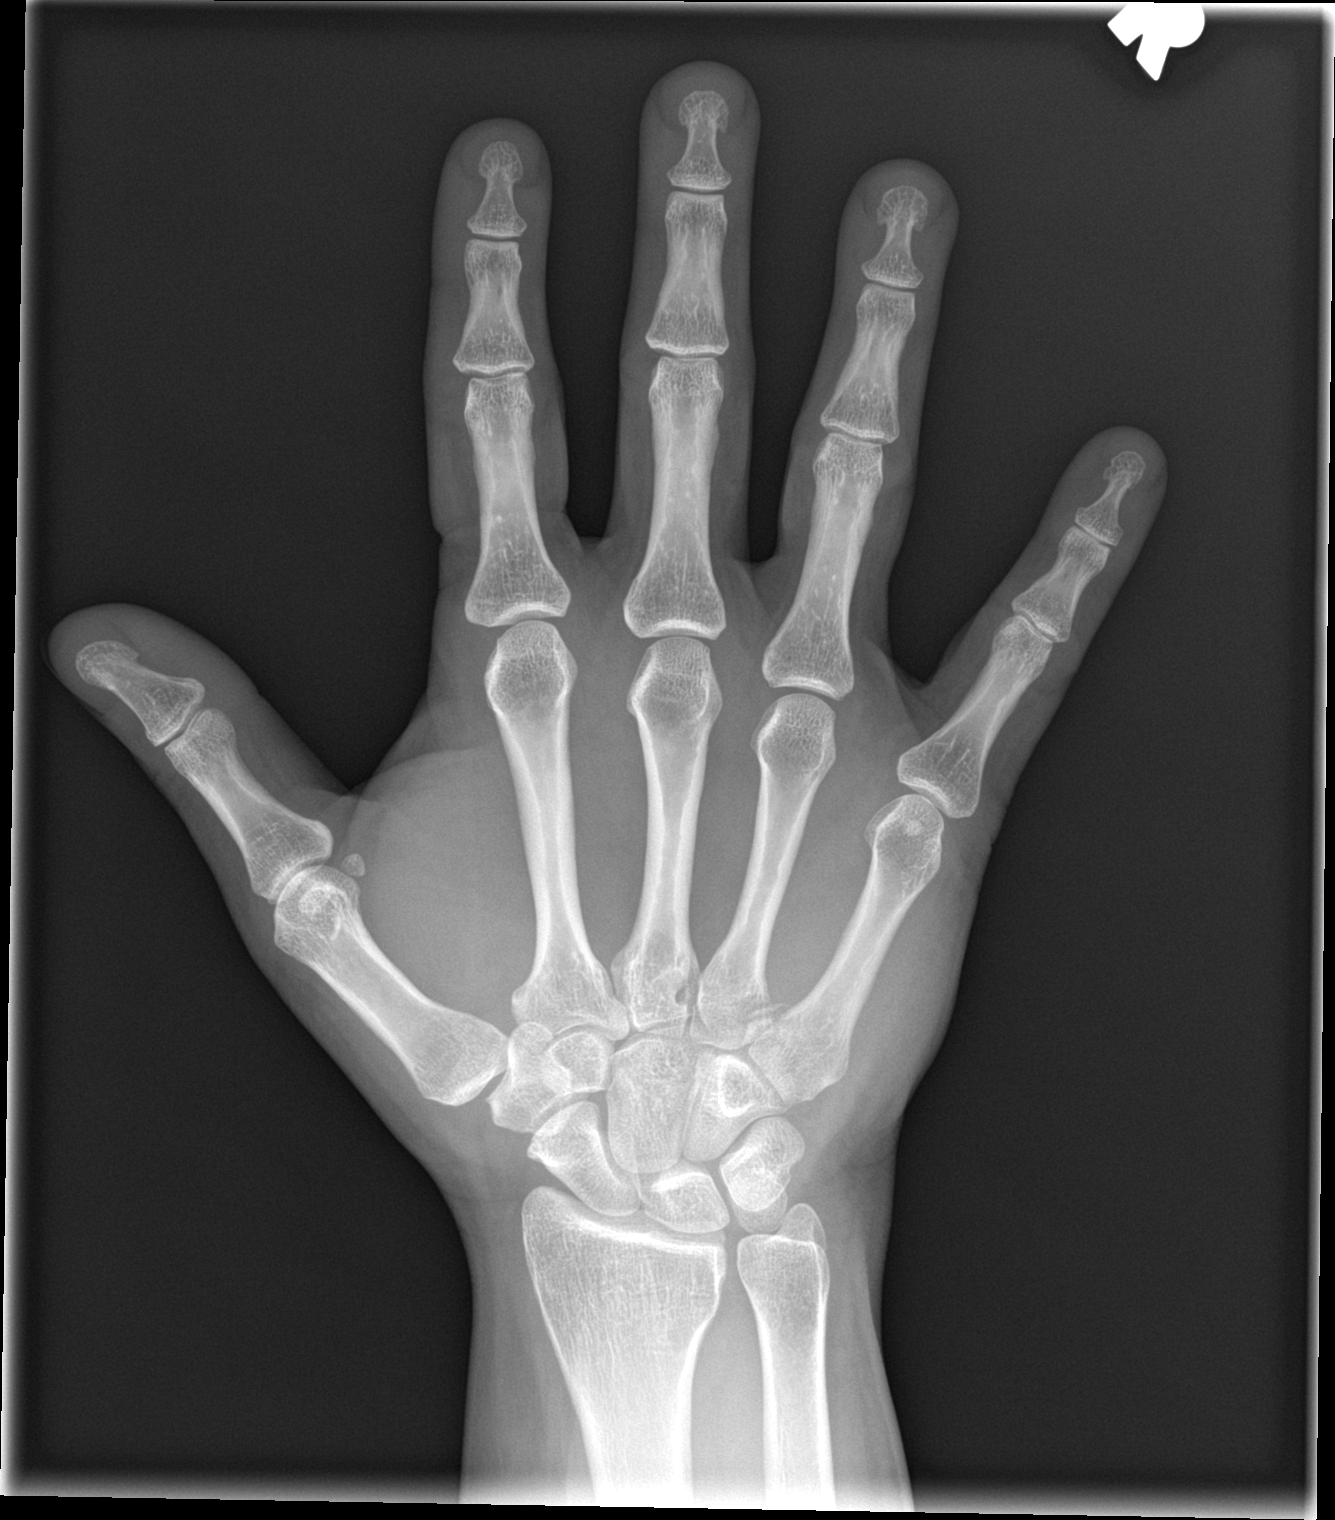

[hand obl]
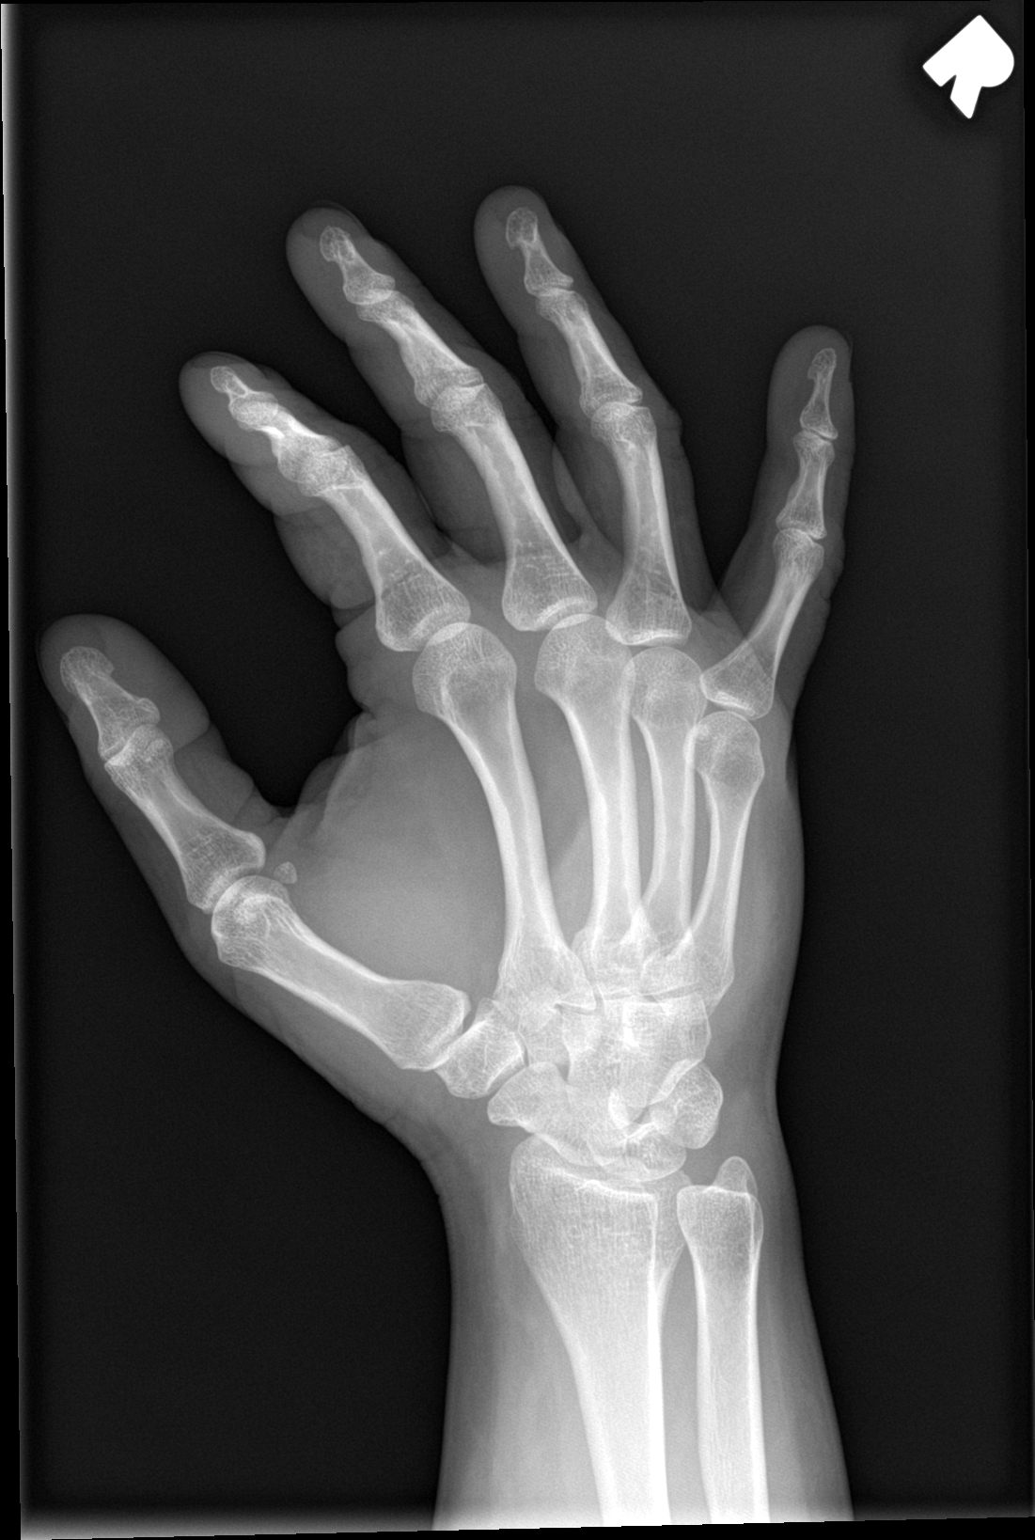

[hand lat]
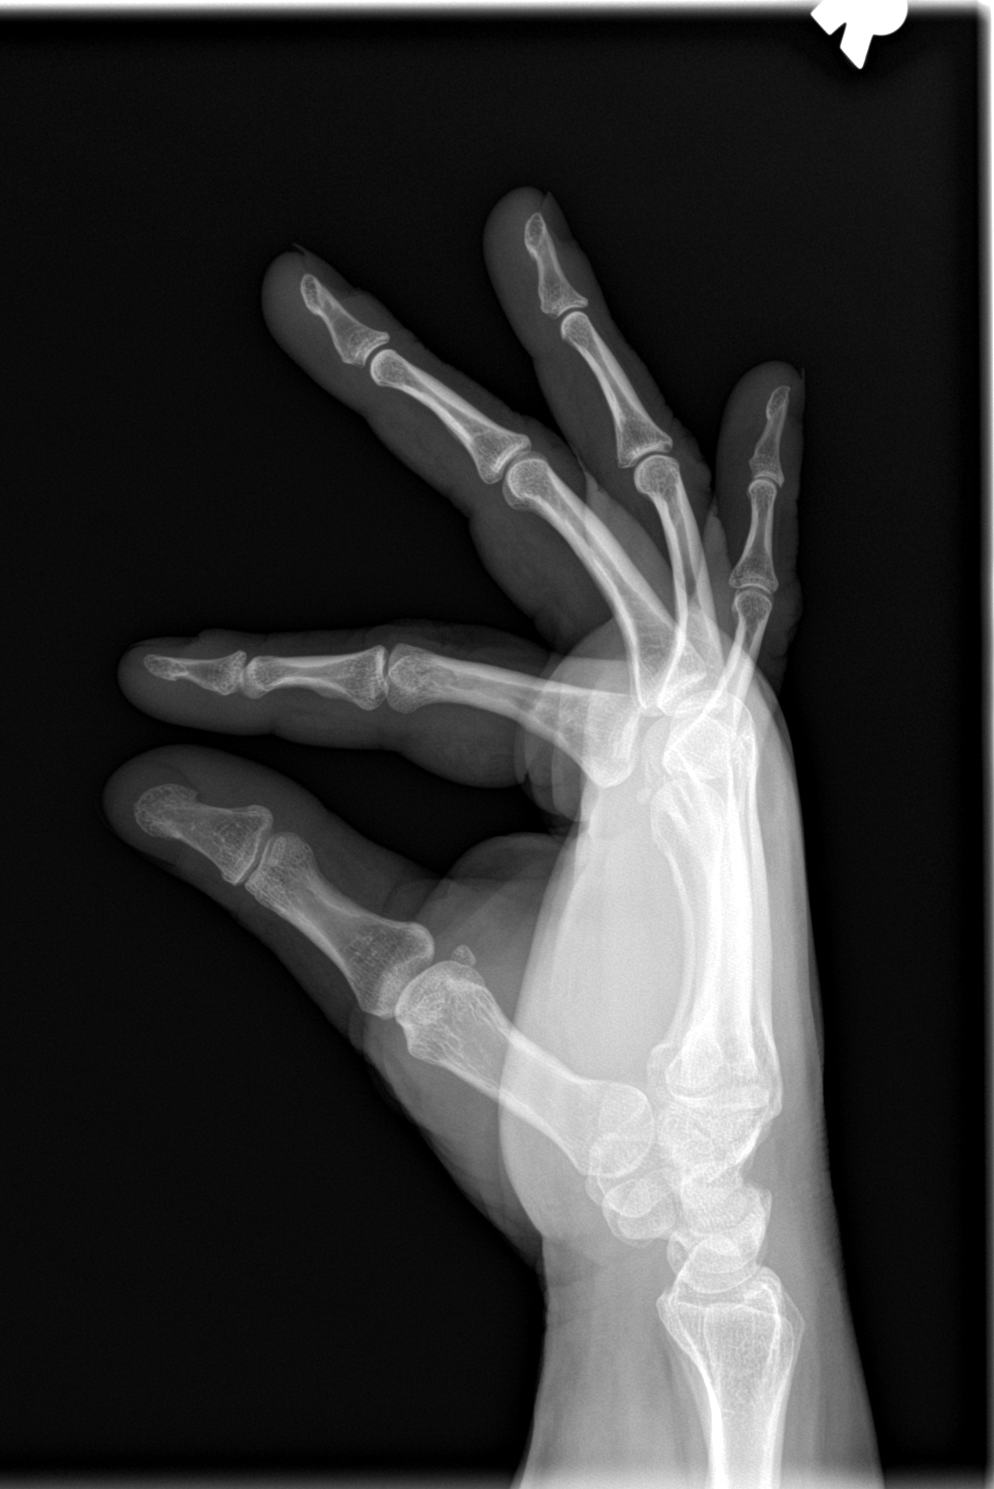

[3 of 3 positions shown; findings below may reference images not displayed]

FINDINGS: There is a an avulsion fracture from the proximal aspect of the
fifth metacarpal laterally. No other fracture or soft tissue
abnormality is seen.
IMPRESSION: Fracture at the base of the fifth metacarpal.

## 2020-12-13 ENCOUNTER — Other Ambulatory Visit (HOSPITAL_COMMUNITY): Payer: Self-pay

## 2020-12-13 MED FILL — Liraglutide (Weight Mngmt) Soln Pen-Inj 18 MG/3ML (6 MG/ML): SUBCUTANEOUS | 30 days supply | Qty: 15 | Fill #0 | Status: AC

## 2021-01-09 ENCOUNTER — Encounter: Payer: Self-pay | Admitting: Family Medicine

## 2021-01-09 DIAGNOSIS — G8929 Other chronic pain: Secondary | ICD-10-CM

## 2021-01-09 DIAGNOSIS — R1031 Right lower quadrant pain: Secondary | ICD-10-CM

## 2021-01-10 NOTE — Addendum Note (Signed)
Addended by: Lamar Blinks C on: 01/10/2021 02:06 PM   Modules accepted: Orders

## 2021-01-22 ENCOUNTER — Ambulatory Visit (HOSPITAL_BASED_OUTPATIENT_CLINIC_OR_DEPARTMENT_OTHER)
Admission: RE | Admit: 2021-01-22 | Discharge: 2021-01-22 | Disposition: A | Discharge status: Home / Self Care | Payer: 59 | Source: Ambulatory Visit | Attending: Family Medicine | Admitting: Family Medicine

## 2021-01-22 ENCOUNTER — Other Ambulatory Visit: Payer: Self-pay

## 2021-01-22 ENCOUNTER — Encounter (HOSPITAL_BASED_OUTPATIENT_CLINIC_OR_DEPARTMENT_OTHER): Payer: Self-pay

## 2021-01-22 DIAGNOSIS — R1031 Right lower quadrant pain: Secondary | ICD-10-CM | POA: Insufficient documentation

## 2021-01-22 DIAGNOSIS — R109 Unspecified abdominal pain: Secondary | ICD-10-CM | POA: Diagnosis not present

## 2021-01-22 DIAGNOSIS — G8929 Other chronic pain: Secondary | ICD-10-CM | POA: Diagnosis not present

## 2021-01-22 LAB — POCT I-STAT CREATININE: Creatinine, Ser: 0.8 mg/dL (ref 0.44–1.00)

## 2021-01-22 MED ORDER — IOHEXOL 300 MG/ML  SOLN
100.0000 mL | Freq: Once | INTRAMUSCULAR | Status: AC | PRN
  Administered 2021-01-22: 100 mL via INTRAVENOUS

## 2021-01-23 ENCOUNTER — Encounter: Payer: Self-pay | Admitting: Family Medicine

## 2021-01-23 DIAGNOSIS — R1031 Right lower quadrant pain: Secondary | ICD-10-CM

## 2021-01-23 DIAGNOSIS — G8929 Other chronic pain: Secondary | ICD-10-CM

## 2021-02-04 ENCOUNTER — Other Ambulatory Visit (HOSPITAL_COMMUNITY): Payer: Self-pay

## 2021-02-04 MED FILL — Liraglutide (Weight Mngmt) Soln Pen-Inj 18 MG/3ML (6 MG/ML): SUBCUTANEOUS | 30 days supply | Qty: 15 | Fill #1 | Status: AC

## 2021-02-06 ENCOUNTER — Encounter: Payer: Self-pay | Admitting: Gastroenterology

## 2021-02-07 ENCOUNTER — Other Ambulatory Visit (HOSPITAL_COMMUNITY): Payer: Self-pay

## 2021-02-12 ENCOUNTER — Other Ambulatory Visit (HOSPITAL_COMMUNITY): Payer: Self-pay

## 2021-02-12 MED ORDER — CARESTART COVID-19 HOME TEST VI KIT
PACK | 0 refills | Status: DC
  Filled 2021-02-12: qty 4, 4d supply, fill #0

## 2021-03-12 ENCOUNTER — Ambulatory Visit: Payer: 59 | Admitting: Gastroenterology

## 2021-03-12 ENCOUNTER — Other Ambulatory Visit: Payer: 59

## 2021-03-12 ENCOUNTER — Encounter: Payer: Self-pay | Admitting: Gastroenterology

## 2021-03-12 VITALS — BP 138/76 | HR 98 | Ht 61.0 in | Wt 179.0 lb

## 2021-03-12 DIAGNOSIS — K582 Mixed irritable bowel syndrome: Secondary | ICD-10-CM

## 2021-03-12 DIAGNOSIS — K219 Gastro-esophageal reflux disease without esophagitis: Secondary | ICD-10-CM

## 2021-03-12 DIAGNOSIS — E538 Deficiency of other specified B group vitamins: Secondary | ICD-10-CM | POA: Diagnosis not present

## 2021-03-12 NOTE — Patient Instructions (Addendum)
If you are age 44 or older, your body mass index should be between 23-30. Your Body mass index is 33.82 kg/m. If this is out of the aforementioned range listed, please consider follow up with your Primary Care Provider.  If you are age 76 or younger, your body mass index should be between 19-25. Your Body mass index is 33.82 kg/m. If this is out of the aformentioned range listed, please consider follow up with your Primary Care Provider.   Start IB guard .  Start Metamucil daily.  Your provider has requested that you go to the basement level for lab work before leaving today. Press "B" on the elevator. The lab is located at the first door on the left as you exit the elevator.   The [facility] providers would like to encourage you to use [facility] to communicate with providers for non-urgent requests or questions.  Due to long hold times on the telephone, sending your provider a message by [facility] may be a faster and more efficient way to get a response.  Please allow 48 business hours for a response.  Please remember that this is for non-urgent requests.   Due to recent changes in healthcare laws, you may see the results of your imaging and laboratory studies on MyChart before your provider has had a chance to review them.  We understand that in some cases there may be results that are confusing or concerning to you. Not all laboratory results come back in the same time frame and the provider may be waiting for multiple results in order to interpret others.  Please give [place] 48 hours in order for your provider to thoroughly review all the results before contacting the office for clarification of your results.   It was a pleasure to see you today!  Thank you for trusting me with your gastrointestinal care!    Scott E. Candis Schatz, MD

## 2021-03-12 NOTE — Progress Notes (Signed)
HPI : [name] is a very pleasant 45 year old female referred by Dr. Lamar Blinks, MD for further evaluation of chronic abdominal pain and irregular bowel habits.  Patient states she has been having problems with abdominal pain for the past year or so now.  The pain is consistently located in the right lower quadrant.  It is dull and achy and comes and goes.  It is not severe, usually  6 out of 10 and usually lasts anywhere from seconds to several minutes at a time.  The pain is not reliably worsened with eating and is not improved with bowel movements.  Her bowel movements are irregular.  Sometimes she will go a week without a bowel movement.  Her stool varies in consistency she denies any blood in her stool.  She had a CT of the abdomen and pelvis recently evaluate this pain and it was unremarkable.  She does have a history of B12 deficiency and recently completed a course of oral B12 supplementation.  Her B12 levels have not been rechecked since then. She started having reflux symptoms in the past few months.  Her symptoms consist of heartburn and acid regurgitation.  Sometimes the symptoms will occur multiple days in a week, then she will go weeks with no symptoms.  Sometimes her symptoms awaken her from sleep.  She has tried Pepcid and Protonix which have both helped.  Her weight has been stable.  Past Medical History:  Diagnosis Date   Allergic rhinitis    Anxiety    History of chicken pox    History of shingles    Hyperlipidemia      Past Surgical History:  Procedure Laterality Date   CHOLECYSTECTOMY     HERNIA REPAIR     wisdome teeth     Family History  Problem Relation Age of Onset   Hypertension Maternal Grandmother    Hypertension Maternal Grandfather    Hypertension Paternal Grandmother    Heart disease Paternal Grandmother    Hypertension Paternal Grandfather    Heart disease Paternal Grandfather    Anesthesia problems Neg Hx    Hypotension Neg Hx    Pseudochol  deficiency Neg Hx    Malignant hyperthermia Neg Hx    Colon cancer Neg Hx    Pancreatic cancer Neg Hx    Esophageal cancer Neg Hx    Stomach cancer Neg Hx    Liver disease Neg Hx    Social History   Tobacco Use   Smoking status: Never    Passive exposure: Never   Smokeless tobacco: Never  Vaping Use   Vaping Use: Never used  Substance Use Topics   Alcohol use: Yes    Comment: occ   Drug use: No   Current Outpatient Medications  Medication Sig Dispense Refill   cyclobenzaprine (FLEXERIL) 10 MG tablet Take 1 tablet (10 mg total) by mouth 3 (three) times daily as needed for muscle spasms. 30 tablet 1   famotidine (PEPCID) 40 MG tablet Take 40 mg by mouth daily.     Insulin Pen Needle (PEN NEEDLES) 32G X 5 MM MISC Use one daily with medication injection 100 each 2   Liraglutide -Weight Management 18 MG/3ML SOPN INJECT 3 MG UNDER THE SKIN DAILY 15 mL 2   simvastatin (ZOCOR) 20 MG tablet TAKE 1 TABLET BY MOUTH AT BEDTIME 90 tablet 3   No current facility-administered medications for this visit.   Allergies  Allergen Reactions   Sulfa Antibiotics  Childhood reaction     Review of Systems: All systems reviewed and negative except where noted in HPI.    No results found.  Physical Exam: BP 138/76   Pulse 98   Ht '5\' 1"'$  (1.549 m)   Wt 179 lb (81.2 kg)   SpO2 99%   BMI 33.82 kg/m  Constitutional: Pleasant,well-developed, Caucasian female in no acute distress.  Accompanied by daughter HEENT: Normocephalic and atraumatic. Conjunctivae are normal. No scleral icterus.  Mallampati 2 Cardiovascular: Normal rate, regular rhythm.  Pulmonary/chest: Effort normal and breath sounds normal. No wheezing, rales or rhonchi. Abdominal: Soft, nondistended, nontender. Bowel sounds active throughout. There are no masses palpable. No hepatomegaly. Extremities: no edema  Neurological: Alert and oriented to person place and time. Skin: Skin is warm and dry. No rashes noted. Psychiatric:  Normal mood and affect. Behavior is normal.  CBC    Component Value Date/Time   WBC 11.3 (H) 11/18/2020 0957   RBC 4.60 11/18/2020 0957   HGB 15.0 11/18/2020 0957   HCT 44.7 11/18/2020 0957   PLT 326.0 11/18/2020 0957   MCV 97.1 11/18/2020 0957   MCH 31.8 11/21/2018 1806   MCHC 33.5 11/18/2020 0957   RDW 13.2 11/18/2020 0957   LYMPHSABS 4.7 (H) 08/21/2016 1346   MONOABS 0.5 08/21/2016 1346   EOSABS 0.1 08/21/2016 1346   BASOSABS 0.0 08/21/2016 1346    CMP     Component Value Date/Time   NA 138 11/18/2020 0957   K 4.5 11/18/2020 0957   CL 103 11/18/2020 0957   CO2 28 11/18/2020 0957   GLUCOSE 78 11/18/2020 0957   BUN 12 11/18/2020 0957   CREATININE 0.80 01/22/2021 1652   CALCIUM 9.8 11/18/2020 0957   PROT 7.3 11/18/2020 0957   ALBUMIN 4.2 11/18/2020 0957   AST 18 11/18/2020 0957   ALT 14 11/18/2020 0957   ALKPHOS 82 11/18/2020 0957   BILITOT 0.6 11/18/2020 0957   GFRNONAA >60 11/21/2018 1806   GFRAA >60 11/21/2018 1806     ASSESSMENT AND PLAN: 45 year old female with 1 year history of vague abdominal discomfort with irregular bowel habits without red flag symptoms.  She does have a B12 deficiency which may raise the possibility of Crohn's disease, however, her CT scan was unremarkable without any evidence of terminal ileal thickening.  Her clinical history seems most consistent with IBS.  We discussed the proposed pathophysiology of IBS and gut brain axis disorders in general.  We discussed management of IBS, to include use of medications to improve bowel habits, as needed pain medicine, centrally acting neuromodulators, role of empiric dietary modifications to include a low FODMAP diet gluten-free diet, as well as the role of cognitive therapies.  We discussed the goals of IBS management, namely to minimize the impact of GI symptoms on quality of life.  I emphasized that it would be unreasonable for the patient to expect completely normal bowel habits and no abdominal  discomfort. The patient was provided handouts with further information on IBS.  I recommended she start with taking a daily fiber supplement (psyllium based).  We provided her with samples of IBgard see if this might help with her pain.  She was provided information on IBS and low FODMAP diet.  She will investigate further to see if that is something she might be interested in. With regards to her GERD, we discussed the pathophysiology of GERD and its management.  Given her infrequent symptoms, I recommended she continue to take Pepcid on a as needed  basis.  Should her symptoms increase in frequency or intensity, then we will consider daily PPI.  I also recommended weight loss as a potential way to decrease her dependency on medication We will screen for pernicious anemia as a cause of her B12 deficiency.  Her PCM is going to follow-up her B12 levels following her oral repletion.  Abdominal pain, irregular bowel habits - Most consistent with IBS - Start Metamucil, trial of IBgard, patient to consider dietary changes such as low FODMAP diet - Screen for celiac with TTG/IgA  B12 deficiency - Check intrinsic factor and parietal cell antibodies  GERD - Treat with as needed Pepcid - Recommended behavioral modifications to include avoiding provocative foods and meals within 2 to 3 hours of bedtime.  I spent a total of 45 minutes reviewing the patient's medical record, interviewing and examining the patient, discussing her diagnosis and management of her condition going forward, and documenting in the medical record  Renner Sebald E. Candis Schatz, MD [facility]

## 2021-03-13 ENCOUNTER — Telehealth: Payer: Self-pay | Admitting: Gastroenterology

## 2021-03-13 NOTE — Telephone Encounter (Signed)
Pt would like to give you some updated information that she missed to tell Dr. Candis Schatz yesterday. Pls call her.

## 2021-03-14 LAB — ANTI-PARIETAL ANTIBODY: Parietal Cell Ab: 9 Units (ref 0.0–20.0)

## 2021-03-14 NOTE — Telephone Encounter (Signed)
Left message for patient to call back  

## 2021-03-17 LAB — IGA: Immunoglobulin A: 225 mg/dL (ref 47–310)

## 2021-03-17 LAB — INTRINSIC FACTOR ANTIBODIES: Intrinsic Factor: NEGATIVE

## 2021-03-17 LAB — TISSUE TRANSGLUTAMINASE, IGA: (tTG) Ab, IgA: <1 U/mL

## 2021-03-18 NOTE — Progress Notes (Signed)
Tricia [name], your tests for celiac disease and autoimmune causes of B12 deficiency were negative.  I recommend you continue with the interventions we discussed in clinic and then follow up in a few months if your symptoms are not improving.

## 2021-03-19 ENCOUNTER — Encounter: Payer: Self-pay | Admitting: Family Medicine

## 2021-03-19 NOTE — Progress Notes (Deleted)
[name] at [facility] [address] [place], [place] [address] [phone]  Date:  03/24/2021   Name:  Tricia [name]   DOB:  1976/05/09   MRN:  [id number]  PCP:  [name], MD    Chief Complaint: No chief complaint on file.   History of Present Illness:  Tricia [name] is a 45 y.o. very pleasant female patient who presents with the following:  Patient is seen today for a periodic follow-up visit and recheck Saxenda Most recent visit with myself was in April History of anxiety, dyslipidemia, prediabetes, obesity Lab work done in [place] 5.5%, vitamin D and vitamin B12 both low, slight leukocytosis; we can recheck these findings today  In June she was having some problems with chronic right lower quadrant abdominal pain.  We did a CT scan which was negative and I referred her to GI She was seen by GI on August 3, symptoms thought due to IBS  Pap smear  COVID-19 booster   Wt Readings from Last 3 Encounters:  03/12/21 179 lb (81.2 kg)  11/18/20 177 lb (80.3 kg)  07/25/20 181 lb (82.1 kg)    Patient Active Problem List   Diagnosis Date Noted   Near syncope 12/07/2018   Nonspecific abnormal electrocardiogram (ECG) (EKG) 12/07/2018   Closed fracture of base of fifth metacarpal bone of right hand with routine healing, subsequent encounter 12/06/2018   Plantar fasciitis of right foot 09/28/2017   GAD (generalized anxiety disorder) 05/19/2017   Closed fracture of third toe of right foot, with nonunion, subsequent encounter 04/19/2017    Past Medical History:  Diagnosis Date   Allergic rhinitis    Anxiety    History of chicken pox    History of shingles    Hyperlipidemia     Past Surgical History:  Procedure Laterality Date   CHOLECYSTECTOMY     HERNIA REPAIR     wisdome teeth      Social History   Tobacco Use   Smoking status: Never    Passive exposure: Never   Smokeless tobacco: Never   Vaping Use   Vaping Use: Never used  Substance Use Topics   Alcohol use: Yes    Comment: occ   Drug use: No    Family History  Problem Relation Age of Onset   Hypertension Maternal Grandmother    Hypertension Maternal Grandfather    Hypertension Paternal Grandmother    Heart disease Paternal Grandmother    Hypertension Paternal Grandfather    Heart disease Paternal Grandfather    Anesthesia problems Neg Hx    Hypotension Neg Hx    Pseudochol deficiency Neg Hx    Malignant hyperthermia Neg Hx    Colon cancer Neg Hx    Pancreatic cancer Neg Hx    Esophageal cancer Neg Hx    Stomach cancer Neg Hx    Liver disease Neg Hx     Allergies  Allergen Reactions   Sulfa Antibiotics     Childhood reaction    Medication list has been reviewed and updated.  Current Outpatient Medications on File Prior to Visit  Medication Sig Dispense Refill   cyclobenzaprine (FLEXERIL) 10 MG tablet Take 1 tablet (10 mg total) by mouth 3 (three) times daily as needed for muscle spasms. 30 tablet 1   famotidine (PEPCID) 40 MG tablet Take 40 mg by mouth daily.     Insulin Pen Needle (PEN NEEDLES) 32G X 5  MM MISC Use one daily with medication injection 100 each 2   Liraglutide -Weight Management 18 MG/3ML SOPN INJECT 3 MG UNDER THE SKIN DAILY 15 mL 2   simvastatin (ZOCOR) 20 MG tablet TAKE 1 TABLET BY MOUTH AT BEDTIME 90 tablet 3   No current facility-administered medications on file prior to visit.    Review of Systems:  As per HPI- otherwise negative.   Physical Examination: There were no vitals filed for this visit. There were no vitals filed for this visit. There is no height or weight on file to calculate BMI. Ideal Body Weight:    GEN: no acute distress. HEENT: Atraumatic, Normocephalic.  Ears and Nose: No external deformity. CV: RRR, No M/G/R. No JVD. No thrill. No extra heart sounds. PULM: CTA B, no wheezes, crackles, rhonchi. No retractions. No resp. distress. No accessory muscle  use. ABD: S, NT, ND, +BS. No rebound. No HSM. EXTR: No c/c/e PSYCH: Normally interactive. Conversant.    Assessment and Plan: *** This visit occurred during the SARS-CoV-2 public health emergency.  Safety protocols were in place, including screening questions prior to the visit, additional usage of staff PPE, and extensive cleaning of exam room while observing appropriate contact time as indicated for disinfecting solutions.   Signed Lamar Blinks, MD

## 2021-03-19 NOTE — Telephone Encounter (Signed)
No return calls from the patient 

## 2021-03-19 NOTE — Progress Notes (Signed)
[place] at [facility] [address] [place], [place] [address] [phone]  Date:  03/20/2021   Name:  Tricia [name]   DOB:  1976/02/25   MRN:  [id number]  PCP:  [name], MD    Chief Complaint: Palpitations (Chest pain, tachycardia, trouble sleeping, headache, pain under breast radiated to back on left side)   History of Present Illness:  Tricia [name] is a 45 y.o. very pleasant female patient who presents with the following:  Pt contacted [place] with concerning episode of CP: I just wanted to let you know that I have had some recent issues.  My family has had Covid for the last 3 weeks.  I was positive as of July 20, quarantined for 5 days and felt better. I do still have a cough and some congestions.   However, over the last couple weeks I feel like I have been possibly having heart palpitations.  Last night, however,  I woke up in a full sweat with some very strong chest pain.  It was mostly under my left breast and radiated to my back.  My heart rate was also increase during this time as well - 100s-130.  It took about 30 min for my heartrate to come back down and I was able to lay back down and rest after about an hour or so.  I am still having a little discomfort this morning but nothing like overnight.   She notes that she is still coughing and bringing up some mucus from her chest since having covid about 3 weeks ago -overall she is much improved No fever since the start of illness For a couple of weeks she has felt possible palpitations.  She will feel like her heart is racing- can occur just at rest.  Episodes may last a minute or two.  May occur about 5x a day She has a smart watch and might register a pulse of about 100- 110 She had an episode of CP on Tuesday night- today is Thursday.  She was awoken from sleep with pain. She put her watch on and noted her pulse was running 110s- 120s for about 30 minutes.  Her BP  was normal  Her pain was on the left, under her left breast She finally went back to sleep and it seemed to be better She was not having SOB   No further CP since that time Yesterday the area felt "sore" but has continued to resolve and she denies current chest pain Today she feels like her upper back is hurting  Her father and grandfather have pacemakers and also history of atrial fib  She has no history of CAD herself Non smoker   She did see cardiology for syncope and abnl EKG in 2020- all seemed to be ok, she had an echo and a holter   Her IUD was placed 2018- 2019 Patient Active Problem List   Diagnosis Date Noted   Near syncope 12/07/2018   Nonspecific abnormal electrocardiogram (ECG) (EKG) 12/07/2018   Closed fracture of base of fifth metacarpal bone of right hand with routine healing, subsequent encounter 12/06/2018   Plantar fasciitis of right foot 09/28/2017   GAD (generalized anxiety disorder) 05/19/2017   Closed fracture of third toe of right foot, with nonunion, subsequent encounter 04/19/2017    Past Medical History:  Diagnosis Date   Allergic rhinitis    Anxiety    History of  chicken pox    History of shingles    Hyperlipidemia     Past Surgical History:  Procedure Laterality Date   CHOLECYSTECTOMY     HERNIA REPAIR     wisdome teeth      Social History   Tobacco Use   Smoking status: Never    Passive exposure: Never   Smokeless tobacco: Never  Vaping Use   Vaping Use: Never used  Substance Use Topics   Alcohol use: Yes    Comment: occ   Drug use: No    Family History  Problem Relation Age of Onset   Hypertension Maternal Grandmother    Hypertension Maternal Grandfather    Hypertension Paternal Grandmother    Heart disease Paternal Grandmother    Hypertension Paternal Grandfather    Heart disease Paternal Grandfather    Anesthesia problems Neg Hx    Hypotension Neg Hx    Pseudochol deficiency Neg Hx    Malignant hyperthermia Neg Hx     Colon cancer Neg Hx    Pancreatic cancer Neg Hx    Esophageal cancer Neg Hx    Stomach cancer Neg Hx    Liver disease Neg Hx     Allergies  Allergen Reactions   Sulfa Antibiotics     Childhood reaction    Medication list has been reviewed and updated.  Current Outpatient Medications on File Prior to Visit  Medication Sig Dispense Refill   cyclobenzaprine (FLEXERIL) 10 MG tablet Take 1 tablet (10 mg total) by mouth 3 (three) times daily as needed for muscle spasms. 30 tablet 1   famotidine (PEPCID) 40 MG tablet Take 40 mg by mouth daily.     Insulin Pen Needle (PEN NEEDLES) 32G X 5 MM MISC Use one daily with medication injection 100 each 2   Liraglutide -Weight Management 18 MG/3ML SOPN INJECT 3 MG UNDER THE SKIN DAILY 15 mL 2   simvastatin (ZOCOR) 20 MG tablet TAKE 1 TABLET BY MOUTH AT BEDTIME 90 tablet 3   No current facility-administered medications on file prior to visit.    Review of Systems:  As per HPI- otherwise negative.   Physical Examination: Vitals:   03/20/21 0955  BP: 108/62  Pulse: 93  Resp: 17  Temp: 97.7 F (36.5 C)  SpO2: 97%   Vitals:   03/20/21 0955  Weight: 176 lb (79.8 kg)  Height: '5\' 1"'$  (1.549 m)   Body mass index is 33.25 kg/m. Ideal Body Weight: Weight in (lb) to have BMI = 25: 132  GEN: no acute distress.  Obese, otherwise looks well HEENT: Atraumatic, Normocephalic.  Ears and Nose: No external deformity. CV: RRR, No M/G/R. No JVD. No thrill. No extra heart sounds. PULM: CTA B, no wheezes, crackles, rhonchi. No retractions. No resp. distress. No accessory muscle use. ABD: S, NT, ND, +BS. No rebound. No HSM. EXTR: No c/c/e PSYCH: Normally interactive. Conversant.  I am able to reproduce some tenderness by pressing on her left ribs under the breast area.  No redness or heat is noted  EKG: Sinus rhythm rate 93.  Machine reads nonspecific T wave abnormality.  Compared with EKG from April 2020 there is decrease in T wave amplitude in  several leads, otherwise no major change noted   Assessment and Plan: Chest pain, unspecified type - Plan: D-Dimer, Quantitative, Basic metabolic panel, DG Chest 2 View, CBC, Troponin I (High Sensitivity), CANCELED: Troponin I (High Sensitivity)  Palpitations - Plan: EKG 12-Lead, D-Dimer, Quantitative, Basic metabolic panel, DG  Chest 2 View, CBC, Troponin I (High Sensitivity), CANCELED: Troponin I (High Sensitivity)  Patient seen today with palpitations for 2 to 3 weeks and a recent episode of chest pain.  She did recently have COVID-19 EKG today shows sinus rhythm.  Her chest pain has now resolved, vital signs are stable Plan to obtain a D-dimer, troponin, chest x-ray.  We will follow-up with patient pending these results  This visit occurred during the SARS-CoV-2 public health emergency.  Safety protocols were in place, including screening questions prior to the visit, additional usage of staff PPE, and extensive cleaning of exam room while observing appropriate contact time as indicated for disinfecting solutions.    Signed Lamar Blinks, MD  Received results so far as follows, await D-dimer.  Message to patient DG Chest 2 View  Result Date: 03/20/2021 CLINICAL DATA:  chest pain 2 days ago EXAM: CHEST - 2 VIEW COMPARISON:  None. FINDINGS: The heart size and mediastinal contours are within normal limits. Both lungs are clear. The visualized skeletal structures are unremarkable. IMPRESSION: No evidence of acute cardiopulmonary disease. Electronically Signed   By: Maurine Simmering M.D.   On: 03/20/2021 11:53    Results for orders placed or performed in visit on [id number]  Basic metabolic panel  Result Value Ref Range   Sodium 138 135 - 145 mEq/L   Potassium 3.4 (L) 3.5 - 5.1 mEq/L   Chloride 101 96 - 112 mEq/L   CO2 28 19 - 32 mEq/L   Glucose, Bld 94 70 - 99 mg/dL   BUN 16 6 - 23 mg/dL   Creatinine, Ser 0.79 0.40 - 1.20 mg/dL   GFR 91.35 >60.00 mL/min   Calcium 9.4 8.4 - 10.5 mg/dL   CBC  Result Value Ref Range   WBC 9.3 4.0 - 10.5 K/uL   RBC 4.41 3.87 - 5.11 Mil/uL   Platelets 321.0 150.0 - 400.0 K/uL   Hemoglobin 14.1 12.0 - 15.0 g/dL   HCT 42.5 36.0 - 46.0 %   MCV 96.5 78.0 - 100.0 fl   MCHC 33.2 30.0 - 36.0 g/dL   RDW 13.7 11.5 - 15.5 %  Troponin I (High Sensitivity)  Result Value Ref Range   High Sens Troponin I 7 2 - 17 ng/L

## 2021-03-19 NOTE — Telephone Encounter (Signed)
Called pt upon reading this message as I was concerned- no answer but lmom on her cell  If still having pain pease go to ER right away!  Otherwise I can overbook her for tomorrow- would rather not wait until Monday

## 2021-03-20 ENCOUNTER — Encounter: Payer: Self-pay | Admitting: Family Medicine

## 2021-03-20 ENCOUNTER — Other Ambulatory Visit: Payer: Self-pay

## 2021-03-20 ENCOUNTER — Ambulatory Visit: Payer: 59 | Admitting: Family Medicine

## 2021-03-20 ENCOUNTER — Ambulatory Visit
Admission: RE | Admit: 2021-03-20 | Discharge: 2021-03-20 | Disposition: A | Discharge status: Home / Self Care | Payer: 59 | Source: Ambulatory Visit | Attending: Family Medicine | Admitting: Family Medicine

## 2021-03-20 VITALS — BP 108/62 | HR 93 | Temp 97.7°F | Resp 17 | Ht 61.0 in | Wt 176.0 lb

## 2021-03-20 DIAGNOSIS — R002 Palpitations: Secondary | ICD-10-CM

## 2021-03-20 DIAGNOSIS — R079 Chest pain, unspecified: Secondary | ICD-10-CM

## 2021-03-20 LAB — BASIC METABOLIC PANEL
BUN: 16 mg/dL (ref 6–23)
CO2: 28 mEq/L (ref 19–32)
Calcium: 9.4 mg/dL (ref 8.4–10.5)
Chloride: 101 mEq/L (ref 96–112)
Creatinine, Ser: 0.79 mg/dL (ref 0.40–1.20)
GFR: 91.35 mL/min (ref >60.00)
Glucose, Bld: 94 mg/dL (ref 70–99)
Potassium: 3.4 mEq/L — ABNORMAL LOW (ref 3.5–5.1)
Sodium: 138 mEq/L (ref 135–145)

## 2021-03-20 LAB — CBC
HCT: 42.5 % (ref 36.0–46.0)
Hemoglobin: 14.1 g/dL (ref 12.0–15.0)
MCHC: 33.2 g/dL (ref 30.0–36.0)
MCV: 96.5 fl (ref 78.0–100.0)
Platelets: 321 10*3/uL (ref 150.0–400.0)
RBC: 4.41 Mil/uL (ref 3.87–5.11)
RDW: 13.7 % (ref 11.5–15.5)
WBC: 9.3 10*3/uL (ref 4.0–10.5)

## 2021-03-20 LAB — D-DIMER, QUANTITATIVE: D-Dimer, Quant: 0.33 mcg/mL FEU (ref <0.50)

## 2021-03-20 LAB — TROPONIN I (HIGH SENSITIVITY): High Sens Troponin I: 7 ng/L (ref 2–17)

## 2021-03-20 NOTE — Patient Instructions (Signed)
Good to see you again today- I will be in touch with your labs asap Please go to lab and then [facility] [facility] on [address] (by the [address]) today for your chest x-ray If you have any change or recurrence in your symptoms please alert me!  Otherwise I will be in touch with your D dimer and troponin later today

## 2021-03-24 ENCOUNTER — Ambulatory Visit: Payer: 59 | Admitting: Family Medicine

## 2021-03-24 DIAGNOSIS — R7303 Prediabetes: Secondary | ICD-10-CM

## 2021-03-24 DIAGNOSIS — E785 Hyperlipidemia, unspecified: Secondary | ICD-10-CM

## 2021-03-24 DIAGNOSIS — E559 Vitamin D deficiency, unspecified: Secondary | ICD-10-CM

## 2021-03-24 DIAGNOSIS — D72829 Elevated white blood cell count, unspecified: Secondary | ICD-10-CM

## 2021-03-24 DIAGNOSIS — E538 Deficiency of other specified B group vitamins: Secondary | ICD-10-CM

## 2021-03-26 ENCOUNTER — Encounter: Payer: Self-pay | Admitting: Family Medicine

## 2021-03-26 DIAGNOSIS — K582 Mixed irritable bowel syndrome: Secondary | ICD-10-CM

## 2021-04-19 NOTE — Progress Notes (Deleted)
[place] at [facility] [address] [place], [place] [address] [phone]  Date:  04/28/2021   Name:  Tricia [name]   DOB:  08/26/1975   MRN:  [id number]  PCP:  [name], MD    Chief Complaint: No chief complaint on file.   History of Present Illness:  Tricia [name] is a 45 y.o. very pleasant female patient who presents with the following:  Pt seen today for a follow-up visit Last seen by myself one month ago - at that time she was having palpitations after an episode of covid 19  Pap screening Covid booster Flu shot  Labs one month ago  Patient Active Problem List   Diagnosis Date Noted   Near syncope 12/07/2018   Nonspecific abnormal electrocardiogram (ECG) (EKG) 12/07/2018   Closed fracture of base of fifth metacarpal bone of right hand with routine healing, subsequent encounter 12/06/2018   Plantar fasciitis of right foot 09/28/2017   GAD (generalized anxiety disorder) 05/19/2017   Closed fracture of third toe of right foot, with nonunion, subsequent encounter 04/19/2017    Past Medical History:  Diagnosis Date   Allergic rhinitis    Anxiety    History of chicken pox    History of shingles    Hyperlipidemia     Past Surgical History:  Procedure Laterality Date   CHOLECYSTECTOMY     HERNIA REPAIR     wisdome teeth      Social History   Tobacco Use   Smoking status: Never    Passive exposure: Never   Smokeless tobacco: Never  Vaping Use   Vaping Use: Never used  Substance Use Topics   Alcohol use: Yes    Comment: occ   Drug use: No    Family History  Problem Relation Age of Onset   Hypertension Maternal Grandmother    Hypertension Maternal Grandfather    Hypertension Paternal Grandmother    Heart disease Paternal Grandmother    Hypertension Paternal Grandfather    Heart disease Paternal Grandfather    Anesthesia problems Neg Hx    Hypotension Neg Hx    Pseudochol  deficiency Neg Hx    Malignant hyperthermia Neg Hx    Colon cancer Neg Hx    Pancreatic cancer Neg Hx    Esophageal cancer Neg Hx    Stomach cancer Neg Hx    Liver disease Neg Hx     Allergies  Allergen Reactions   Sulfa Antibiotics     Childhood reaction    Medication list has been reviewed and updated.  Current Outpatient Medications on File Prior to Visit  Medication Sig Dispense Refill   cyclobenzaprine (FLEXERIL) 10 MG tablet Take 1 tablet (10 mg total) by mouth 3 (three) times daily as needed for muscle spasms. 30 tablet 1   famotidine (PEPCID) 40 MG tablet Take 40 mg by mouth daily.     Insulin Pen Needle (PEN NEEDLES) 32G X 5 MM MISC Use one daily with medication injection 100 each 2   Liraglutide -Weight Management 18 MG/3ML SOPN INJECT 3 MG UNDER THE SKIN DAILY 15 mL 2   simvastatin (ZOCOR) 20 MG tablet TAKE 1 TABLET BY MOUTH AT BEDTIME 90 tablet 3   No current facility-administered medications on file prior to visit.    Review of Systems:  As per HPI- otherwise negative.   Physical Examination: There were no vitals filed for this visit. There were no  vitals filed for this visit. There is no height or weight on file to calculate BMI. Ideal Body Weight:    GEN: no acute distress. HEENT: Atraumatic, Normocephalic.  Ears and Nose: No external deformity. CV: RRR, No M/G/R. No JVD. No thrill. No extra heart sounds. PULM: CTA B, no wheezes, crackles, rhonchi. No retractions. No resp. distress. No accessory muscle use. ABD: S, NT, ND, +BS. No rebound. No HSM. EXTR: No c/c/e PSYCH: Normally interactive. Conversant.    Assessment and Plan: ***  This visit occurred during the SARS-CoV-2 public health emergency.  Safety protocols were in place, including screening questions prior to the visit, additional usage of staff PPE, and extensive cleaning of exam room while observing appropriate contact time as indicated for disinfecting solutions.   Signed Lamar Blinks, MD

## 2021-04-23 ENCOUNTER — Other Ambulatory Visit: Payer: Self-pay | Admitting: Family Medicine

## 2021-04-24 ENCOUNTER — Other Ambulatory Visit (HOSPITAL_COMMUNITY): Payer: Self-pay

## 2021-04-24 MED ORDER — SAXENDA 18 MG/3ML ~~LOC~~ SOPN
PEN_INJECTOR | SUBCUTANEOUS | 2 refills | Status: DC
  Filled 2021-04-24: qty 15, 30d supply, fill #0

## 2021-04-28 ENCOUNTER — Ambulatory Visit: Payer: 59 | Admitting: Family Medicine

## 2021-04-29 NOTE — Progress Notes (Addendum)
[place] at [facility] [address], [place], [place] [address] [phone]  Date:  04/30/2021   Name:  Tricia [name]   DOB:  1975-10-03   MRN:  [id number]  PCP:  [name], MD    Chief Complaint: 6 week follow up (Chest pain, palpitations/Concerns/ questions: pt says she has some congestion in her head/)   History of Present Illness:  Tricia [name] is a 45 y.o. very pleasant female patient who presents with the following:  Patient seen today for short-term follow-up-history of dyslipidemia, prediabetes She is using [place] for weight loss, simvastatin  She is using saxenda- she lost some weight but then seemed to stall and be stuck around her current weight She is taking 3 mg daily -no side effects, medication is affordable for She has used it for about 10 months   Weight 07/25/20- 181  Wt Readings from Last 3 Encounters:  04/30/21 176 lb 12.8 oz (80.2 kg)  03/20/21 176 lb (79.8 kg)  03/12/21 179 lb (81.2 kg)    Most recent visit with myself on August 11th with concern of chest pain: Patient seen today with palpitations for 2 to 3 weeks and a recent episode of chest pain.  She did recently have COVID-19 EKG today shows sinus rhythm.  Her chest pain has now resolved, vital signs are stable Plan to obtain a D-dimer, troponin, chest x-ray.  We will follow-up with patient pending these results Her D-dimer and troponin were negative at that time, chest x-ray negative  COVID-19 booster Flu vaccine- she will do later this fall   Notes that she tends to feel quite tired.  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  She is working 5 days a week.  Finding time for exercise is challenging She is using B12 at home, and is sometimes taking her vit D but not always.  She has an up-to-date IUD in place Lab Results  Component Value Date   TSH 1.29 11/18/2020   Finally,  over the last 5 days or so she has noted some sinus congestion and now pain in her right ear  Patient Active Problem List   Diagnosis Date Noted   Near syncope 12/07/2018   Nonspecific abnormal electrocardiogram (ECG) (EKG) 12/07/2018   Closed fracture of base of fifth metacarpal bone of right hand with routine healing, subsequent encounter 12/06/2018   Plantar fasciitis of right foot 09/28/2017   GAD (generalized anxiety disorder) 05/19/2017   Closed fracture of third toe of right foot, with nonunion, subsequent encounter 04/19/2017    Past Medical History:  Diagnosis Date   Allergic rhinitis    Anxiety    History of chicken pox    History of shingles    Hyperlipidemia     Past Surgical History:  Procedure Laterality Date   CHOLECYSTECTOMY     HERNIA REPAIR     wisdome teeth      Social History   Tobacco Use   Smoking status: Never    Passive exposure: Never   Smokeless tobacco: Never  Vaping Use   Vaping Use: Never used  Substance Use Topics   Alcohol use: Yes    Comment: occ   Drug use: No    Family History  Problem Relation Age of Onset   Hypertension Maternal Grandmother    Hypertension Maternal Grandfather    Hypertension  Paternal Grandmother    Heart disease Paternal Grandmother    Hypertension Paternal Grandfather    Heart disease Paternal Grandfather    Anesthesia problems Neg Hx    Hypotension Neg Hx    Pseudochol deficiency Neg Hx    Malignant hyperthermia Neg Hx    Colon cancer Neg Hx    Pancreatic cancer Neg Hx    Esophageal cancer Neg Hx    Stomach cancer Neg Hx    Liver disease Neg Hx     Allergies  Allergen Reactions   Sulfa Antibiotics     Childhood reaction    Medication list has been reviewed and updated.  Current Outpatient Medications on File Prior to Visit  Medication Sig Dispense Refill   cyclobenzaprine (FLEXERIL) 10 MG tablet Take 1 tablet (10 mg total) by mouth 3 (three) times daily as needed for muscle spasms. 30 tablet  1   famotidine (PEPCID) 40 MG tablet Take 40 mg by mouth daily.     Insulin Pen Needle (PEN NEEDLES) 32G X 5 MM MISC Use one daily with medication injection 100 each 2   Liraglutide -Weight Management (SAXENDA) 18 MG/3ML SOPN INJECT 3 MG UNDER THE SKIN DAILY 15 mL 2   simvastatin (ZOCOR) 20 MG tablet TAKE 1 TABLET BY MOUTH AT BEDTIME 90 tablet 3   No current facility-administered medications on file prior to visit.    Review of Systems:  As per HPI- otherwise negative.   Physical Examination: Vitals:   04/30/21 1124  BP: 110/72  Pulse: 78  Resp: 18  Temp: 98.7 F (37.1 C)  SpO2: 97%   Vitals:   04/30/21 1124  Weight: 176 lb 12.8 oz (80.2 kg)  Height: 5\' 1"  (1.549 m)   Body mass index is 33.41 kg/m. Ideal Body Weight: Weight in (lb) to have BMI = 25: 132  GEN: no acute distress.  Obese, otherwise looks well HEENT: Atraumatic, Normocephalic.  Right TM is injected.  Otherwise ENT exam normal Ears and Nose: No external deformity. CV: RRR, No M/G/R. No JVD. No thrill. No extra heart sounds. PULM: CTA B, no wheezes, crackles, rhonchi. No retractions. No resp. distress. No accessory muscle use. ABD: S, NT, ND. No rebound. No HSM. EXTR: No c/c/e PSYCH: Normally interactive. Conversant.    Assessment and Plan: B12 deficiency - Plan: B12  Vitamin D deficiency - Plan: VITAMIN D 25 Hydroxy (Vit-D Deficiency, Fractures)  Primary insomnia - Plan: traZODone (DESYREL) 50 MG tablet  Body aches - Plan: Sedimentation rate, C-reactive protein  Left otitis media, unspecified otitis media type - Plan: amoxicillin (AMOXIL) 500 MG capsule  Fatigue, unspecified type  Sedentary lifestyle  Patient seen today for follow-up.  She notes a fair amount of fatigue.  We will check her B12 and vitamin D levels today.  I did encourage her to work on exercise as she is able, this is a challenge given her schedule She did do a sleep study a few years ago which was normal.  Her husband has not  commented about excessive snoring Will plan further follow- up pending labs.  Will continue Saxenda for now.  However, if weight has not moved in the next 3 months we will plan to stop use  Prescribed amoxicillin for otitis media  This visit occurred during the SARS-CoV-2 public health emergency.  Safety protocols were in place, including screening questions prior to the visit, additional usage of staff PPE, and extensive cleaning of exam room while observing appropriate contact time as indicated  for disinfecting solutions.   Signed Lamar Blinks, MD  9/22, received her labs as below.  Message to patient  Results for orders placed or performed in visit on 04/30/21  Sedimentation rate  Result Value Ref Range   Sed Rate 21 (H) 0 - 20 mm/hr  C-reactive protein  Result Value Ref Range   CRP <1.0 0.5 - 20.0 mg/dL  VITAMIN D 25 Hydroxy (Vit-D Deficiency, Fractures)  Result Value Ref Range   VITD 28.80 (L) 30.00 - 100.00 ng/mL  B12  Result Value Ref Range   Vitamin B-12 228 211 - 911 pg/mL

## 2021-04-30 ENCOUNTER — Other Ambulatory Visit (HOSPITAL_COMMUNITY): Payer: Self-pay

## 2021-04-30 ENCOUNTER — Other Ambulatory Visit: Payer: Self-pay

## 2021-04-30 ENCOUNTER — Ambulatory Visit: Payer: 59 | Admitting: Family Medicine

## 2021-04-30 VITALS — BP 110/72 | HR 78 | Temp 98.7°F | Resp 18 | Ht 61.0 in | Wt 176.8 lb

## 2021-04-30 DIAGNOSIS — R5383 Other fatigue: Secondary | ICD-10-CM | POA: Diagnosis not present

## 2021-04-30 DIAGNOSIS — R52 Pain, unspecified: Secondary | ICD-10-CM | POA: Diagnosis not present

## 2021-04-30 DIAGNOSIS — E559 Vitamin D deficiency, unspecified: Secondary | ICD-10-CM | POA: Diagnosis not present

## 2021-04-30 DIAGNOSIS — H6692 Otitis media, unspecified, left ear: Secondary | ICD-10-CM | POA: Diagnosis not present

## 2021-04-30 DIAGNOSIS — F5101 Primary insomnia: Secondary | ICD-10-CM

## 2021-04-30 DIAGNOSIS — E538 Deficiency of other specified B group vitamins: Secondary | ICD-10-CM | POA: Diagnosis not present

## 2021-04-30 DIAGNOSIS — Z9189 Other specified personal risk factors, not elsewhere classified: Secondary | ICD-10-CM | POA: Diagnosis not present

## 2021-04-30 MED ORDER — TRAZODONE HCL 50 MG PO TABS
25.0000 mg | ORAL_TABLET | Freq: Every evening | ORAL | 3 refills | Status: DC | PRN
  Filled 2021-04-30: qty 30, 30d supply, fill #0

## 2021-04-30 MED ORDER — AMOXICILLIN 500 MG PO CAPS
1000.0000 mg | ORAL_CAPSULE | Freq: Two times a day (BID) | ORAL | 0 refills | Status: DC
  Filled 2021-04-30: qty 40, 10d supply, fill #0

## 2021-04-30 NOTE — Patient Instructions (Signed)
It was good to see you today- I will be in touch with your labs Continue saxenda for now- if weight continues to be stable in about 3 months we can stop using it Try trazodone as needed for sleep Amoxicilin for ear infection   Try to start an exercise habit- start SMALL and build as you are able 5 minutes to start is fine!

## 2021-05-01 ENCOUNTER — Encounter: Payer: Self-pay | Admitting: Family Medicine

## 2021-05-01 LAB — VITAMIN D 25 HYDROXY (VIT D DEFICIENCY, FRACTURES): VITD: 28.8 ng/mL — ABNORMAL LOW (ref 30.00–100.00)

## 2021-05-01 LAB — C-REACTIVE PROTEIN: CRP: <1 mg/dL (ref 0.5–20.0)

## 2021-05-01 LAB — SEDIMENTATION RATE: Sed Rate: 21 mm/hr — ABNORMAL HIGH (ref 0–20)

## 2021-05-01 LAB — VITAMIN B12: Vitamin B-12: 228 pg/mL (ref 211–911)

## 2021-05-14 ENCOUNTER — Encounter: Payer: Self-pay | Admitting: Family Medicine

## 2021-05-14 DIAGNOSIS — H6692 Otitis media, unspecified, left ear: Secondary | ICD-10-CM

## 2021-05-26 ENCOUNTER — Other Ambulatory Visit (HOSPITAL_COMMUNITY): Payer: Self-pay

## 2021-05-26 MED ORDER — PREDNISONE 20 MG PO TABS
ORAL_TABLET | ORAL | 0 refills | Status: DC
  Filled 2021-05-26: qty 9, 6d supply, fill #0

## 2021-07-11 ENCOUNTER — Telehealth: Payer: Self-pay

## 2021-07-11 NOTE — Telephone Encounter (Signed)
Tricia [name] (Key: [id number]) [name] 18MG [name] pen-injectors   Form Med[employer]act ePA Form 2017 NCPDP

## 2021-07-11 NOTE — Telephone Encounter (Signed)
Your prior authorization for Tricia [name] has been approved! MORE INFO For eligible patients, copay assistance may be available. To learn more and be redirected to the [facility] website, click on the "More Info" button to the right. Please also note that you may need to schedule a follow-up visit with your patient prior to the expiration of this prior authorization, as updated patient weight may be required for reauthorization.  Message from plan: The request has been approved. The authorization is effective for a maximum of 12 fills from 07/11/2021 to 07/10/2022, as long as the member is enrolled in their current health plan. The request was approved with a quantity restriction. This has been approved for a quantity limit of 15 with a day supply limit of 30. A written notification letter will follow with additional details.

## 2021-07-26 NOTE — Progress Notes (Signed)
[place] at [facility] [address], [place], [place] [address] [phone]  Date:  07/30/2021   Name:  Tricia [name]   DOB:  27-May-1976   MRN:  [id number]  PCP:  [name], MD    Chief Complaint: 3 month follow up (Concerns/ questions: all of her children have had a virus and now she has it. She has tested negative for covid. Her sxs started last Friday. /)   History of Present Illness:  Tricia [name] is a 45 y.o. very pleasant female patient who presents with the following:  Pt seen today for follow-up Last seen by myself in September- history of dyslipidemia, prediabetes  Has been using [place] but at our last visit weight loss had stalled out- need to recheck today. She actually stopped using Saxenda shortly after our last visit, about 3 months ago.  Her weight has remained stable with no regain-admits she is struggling more with increased hunger however We discussed possibly starting Mounjaro or another similar medication, she wishes to think about this right now Wt Readings from Last 3 Encounters:  07/30/21 177 lb 9.6 oz (80.6 kg)  04/30/21 176 lb 12.8 oz (80.2 kg)  03/20/21 176 lb (79.8 kg)   Pap- this is scheduled/ also mamo per her GYN, she also needs her IUD changed this year Covid booster- pt declines  Flu vaccine- done  Mammo  one year ago  BMP, CBC done in August- normal   She has noted pain in her left foot/ toe which has bothered her for a few months-  It may come and go somewhat but is more often there  Tends to bother her more at night She is not aware of any injury No history of gout Patient Active Problem List   Diagnosis Date Noted   Near syncope 12/07/2018   Nonspecific abnormal electrocardiogram (ECG) (EKG) 12/07/2018   Closed fracture of base of fifth metacarpal bone of right hand with routine healing, subsequent encounter 12/06/2018   Plantar fasciitis of right foot 09/28/2017   GAD  (generalized anxiety disorder) 05/19/2017   Closed fracture of third toe of right foot, with nonunion, subsequent encounter 04/19/2017    Past Medical History:  Diagnosis Date   Allergic rhinitis    Anxiety    History of chicken pox    History of shingles    Hyperlipidemia     Past Surgical History:  Procedure Laterality Date   CHOLECYSTECTOMY     HERNIA REPAIR     wisdome teeth      Social History   Tobacco Use   Smoking status: Never    Passive exposure: Never   Smokeless tobacco: Never  Vaping Use   Vaping Use: Never used  Substance Use Topics   Alcohol use: Yes    Comment: occ   Drug use: No    Family History  Problem Relation Age of Onset   Hypertension Maternal Grandmother    Hypertension Maternal Grandfather    Hypertension Paternal Grandmother    Heart disease Paternal Grandmother    Hypertension Paternal Grandfather    Heart disease Paternal Grandfather    Anesthesia problems Neg Hx    Hypotension Neg Hx    Pseudochol deficiency Neg Hx    Malignant hyperthermia Neg Hx    Colon cancer Neg Hx    Pancreatic cancer Neg Hx    Esophageal cancer Neg Hx    Stomach cancer Neg Hx  Liver disease Neg Hx     Allergies  Allergen Reactions   Sulfa Antibiotics     Childhood reaction    Medication list has been reviewed and updated.  Current Outpatient Medications on File Prior to Visit  Medication Sig Dispense Refill   cyclobenzaprine (FLEXERIL) 10 MG tablet Take 1 tablet (10 mg total) by mouth 3 (three) times daily as needed for muscle spasms. 30 tablet 1   famotidine (PEPCID) 40 MG tablet Take 40 mg by mouth daily.     Insulin Pen Needle (PEN NEEDLES) 32G X 5 MM MISC Use one daily with medication injection 100 each 2   traZODone (DESYREL) 50 MG tablet Take 1/2 to 1 tablet (25-50 mg total) by mouth at bedtime as needed for sleep. 30 tablet 3   simvastatin (ZOCOR) 20 MG tablet TAKE 1 TABLET BY MOUTH AT BEDTIME 90 tablet 3   No current  facility-administered medications on file prior to visit.    Review of Systems:  As per HPI- otherwise negative.   Physical Examination: Vitals:   07/30/21 1353  BP: 122/80  Pulse: 86  Resp: 18  Temp: 98.6 F (37 C)  SpO2: 98%   Vitals:   07/30/21 1353  Weight: 177 lb 9.6 oz (80.6 kg)  Height: 5\' 1"  (1.549 m)   Body mass index is 33.56 kg/m. Ideal Body Weight: Weight in (lb) to have BMI = 25: 132  GEN: no acute distress.  Mild obesity, looks well HEENT: Atraumatic, Normocephalic.  Ears and Nose: No external deformity. CV: RRR, No M/G/R. No JVD. No thrill. No extra heart sounds. PULM: CTA B, no wheezes, crackles, rhonchi. No retractions. No resp. distress. No accessory muscle use. EXTR: No c/c/e PSYCH: Normally interactive. Conversant.  Left great toe/left first MTP is normal on exam.  No heat or redness to indicate gout  Assessment and Plan: Obesity with serious comorbidity, unspecified classification, unspecified obesity type  Great toe pain, left  Patient seen today for follow-up.  As above, she is no longer using Saxenda- Her weight Has remained stable We discussed trying an alternative medication such as Mounjaro, for the time being she declines but she will keep this in mind.  She will let me know if she changes her mind  Discussed her toe pain.  I offered to obtain an x-ray, again she declines today.  She might follow-up with sports medicine or let me know if she desires any other evaluation  Otherwise she plans to see me in April for a physical  Signed Lamar Blinks, MD

## 2021-07-30 ENCOUNTER — Ambulatory Visit: Payer: 59 | Admitting: Family Medicine

## 2021-07-30 VITALS — BP 122/80 | HR 86 | Temp 98.6°F | Resp 18 | Ht 61.0 in | Wt 177.6 lb

## 2021-07-30 DIAGNOSIS — M79675 Pain in left toe(s): Secondary | ICD-10-CM | POA: Diagnosis not present

## 2021-07-30 DIAGNOSIS — E669 Obesity, unspecified: Secondary | ICD-10-CM

## 2021-07-30 NOTE — Patient Instructions (Signed)
Good to see you today!   Let me know if you would like to try [facility] or have an x-ray of your foot Otherwise we can plan for a physical in April

## 2021-09-08 ENCOUNTER — Ambulatory Visit: Payer: 59 | Admitting: Family Medicine

## 2021-09-08 ENCOUNTER — Encounter: Payer: Self-pay | Admitting: Family Medicine

## 2021-09-08 ENCOUNTER — Other Ambulatory Visit: Payer: Self-pay

## 2021-09-08 ENCOUNTER — Ambulatory Visit: Payer: Self-pay

## 2021-09-08 VITALS — BP 130/88 | Ht 61.0 in | Wt 180.0 lb

## 2021-09-08 DIAGNOSIS — M25572 Pain in left ankle and joints of left foot: Secondary | ICD-10-CM

## 2021-09-08 DIAGNOSIS — S93402A Sprain of unspecified ligament of left ankle, initial encounter: Secondary | ICD-10-CM | POA: Diagnosis not present

## 2021-09-08 DIAGNOSIS — S93409A Sprain of unspecified ligament of unspecified ankle, initial encounter: Secondary | ICD-10-CM | POA: Insufficient documentation

## 2021-09-08 NOTE — Patient Instructions (Signed)
You sprained this ankle 4 weeks ago and it is healing. Ice the area for 15 minutes at a time, 3-4 times a day to help with swelling as needed. Aleve or ibuprofen only if needed. Start theraband strengthening exercises - once a day 3 sets of 10 for the next month. Consider physical therapy for strengthening and balance exercises. Follow up in 1 month if not improving as expected.

## 2021-09-08 NOTE — Progress Notes (Addendum)
° ° °  SUBJECTIVE:   CHIEF COMPLAINT / HPI: left ankle pain  Inverted left ankle about 1 month ago after getting out of chair and legs had fallen asleep.  No bruising at that time.  Was able to weight bear.  Wrapped in Ace bandage with some relief.  Takes Naproxen and Tylenol intermittently for pain.  Has recently noticed more swelling on left ankle. Pain located on outside of left ankle.  Worse when crossing feet or sitting cross legged.  PERTINENT  PMH / PSH:    OBJECTIVE:   BP 130/88    Ht 5\' 1"  (1.549 m)    Wt 180 lb (81.6 kg)    BMI 34.01 kg/m    General: Alert, no acute distress Left Ankle: - Inspection: No obvious deformity, erythema,or ecchymosis, ulcers, calluses, blisters.  Mild edema surrounding lateral malleolus - Palpation: No TTP at MT heads, no TTP at base of 5th MT, no TTP over cuboid, no tenderness over navicular prominence, no TTP over medial malleolus. TTP over lateral malleolus No sign of peroneal tendon subluxation or TTP. - Strength: Normal strength with dorsiflexion, plantarflexion, inversion, and eversion of foot; flexion and extension of toes - ROM: Full ROM - Neuro/vasc: NV intact - Special Tests: Trace anterior drawer, negative talar tilt.  Negative syndesmotic compression.  Limited MSK u/s left ankle:  No cortical irregularity of lateral malleolus.  Peroneal tendons intact.  ATFL appears intact without obvious tear.  Distal syndesmosis intact.  No ankle effusion.  No irregularities of visible talar dome.  ASSESSMENT/PLAN:   Sprain of ankle Healing sprain ankle from injury 4 weeks ago Ice 15 mins 3-4 times daily Gentle strengthening exercises with theraband Consider PT is no improvement with home exercises Aleve as needed Follow up with Dr Barbaraann Barthel in 4 weeks if not improving     Carollee Leitz, MD [place]

## 2021-09-08 NOTE — Assessment & Plan Note (Signed)
Healing sprain ankle from injury 4 weeks ago Ice 15 mins 3-4 times daily Gentle strengthening exercises with theraband Consider PT is no improvement with home exercises Aleve as needed Follow up with Dr Barbaraann Barthel in 4 weeks if not improving

## 2021-09-17 ENCOUNTER — Encounter: Payer: Self-pay | Admitting: Family Medicine

## 2021-09-17 NOTE — Telephone Encounter (Signed)
Pt scheduled for 10 am tomorrow 09/18/21- pt aware.

## 2021-09-18 ENCOUNTER — Other Ambulatory Visit (HOSPITAL_BASED_OUTPATIENT_CLINIC_OR_DEPARTMENT_OTHER): Payer: Self-pay

## 2021-09-18 ENCOUNTER — Ambulatory Visit: Payer: 59 | Admitting: Family Medicine

## 2021-09-18 VITALS — BP 110/82 | HR 116 | Temp 98.6°F | Resp 18 | Ht 61.0 in

## 2021-09-18 DIAGNOSIS — J020 Streptococcal pharyngitis: Secondary | ICD-10-CM | POA: Diagnosis not present

## 2021-09-18 DIAGNOSIS — J029 Acute pharyngitis, unspecified: Secondary | ICD-10-CM | POA: Diagnosis not present

## 2021-09-18 LAB — POCT RAPID STREP A (OFFICE): Rapid Strep A Screen: POSITIVE — AB

## 2021-09-18 MED ORDER — PENICILLIN V POTASSIUM 500 MG PO TABS
500.0000 mg | ORAL_TABLET | Freq: Two times a day (BID) | ORAL | 0 refills | Status: DC
  Filled 2021-09-18: qty 20, 10d supply, fill #0

## 2021-09-18 MED ORDER — ACETAMINOPHEN-CODEINE #3 300-30 MG PO TABS
1.0000 | ORAL_TABLET | Freq: Four times a day (QID) | ORAL | 0 refills | Status: DC | PRN
  Filled 2021-09-18: qty 20, 3d supply, fill #0

## 2021-09-18 NOTE — Progress Notes (Signed)
[name] at [facility] [address] [place],  [address] [phone]  Date:  09/18/2021   Name:  Tricia [name]   DOB:  September 20, 1975   MRN:  [id number]  PCP:  [name], MD    Chief Complaint: URI (X Sunday: Sore throat, body aches, cough- worse at night,congestion)   History of Present Illness:  Tricia [name] is a 46 y.o. very pleasant female patient who presents with the following:  Pt seen today with concern of illness Last visit with myself in December  She sent a mychart message yesterday:  Today is Thursday-  It seems that I have developed some laryngitis since Sunday evening.  It has gotten progressively worse the last couple of days.  I do have a really sore throat, cough at night and some congestion.  I did have some body aches Monday but not as bad since. My throat does look red and irritated. When would I need to be seen to determine if any thing else may be the cause.  I have never had this before.   Her main sx right now is her throat Sh has a minor cough "Feels like strep" to her  Her ears feel a little uncomfortable Temp to 99. 7 yesterday Some nausea- no vomiting  Patient Active Problem List   Diagnosis Date Noted   Sprain of ankle 09/08/2021   Near syncope 12/07/2018   Nonspecific abnormal electrocardiogram (ECG) (EKG) 12/07/2018   Closed fracture of base of fifth metacarpal bone of right hand with routine healing, subsequent encounter 12/06/2018   Plantar fasciitis of right foot 09/28/2017   GAD (generalized anxiety disorder) 05/19/2017   Closed fracture of third toe of right foot, with nonunion, subsequent encounter 04/19/2017    Past Medical History:  Diagnosis Date   Allergic rhinitis    Anxiety    History of chicken pox    History of shingles    Hyperlipidemia     Past Surgical History:  Procedure Laterality Date   CHOLECYSTECTOMY     HERNIA REPAIR     wisdome teeth       Social History   Tobacco Use   Smoking status: Never    Passive exposure: Never   Smokeless tobacco: Never  Vaping Use   Vaping Use: Never used  Substance Use Topics   Alcohol use: Yes    Comment: occ   Drug use: No    Family History  Problem Relation Age of Onset   Hypertension Maternal Grandmother    Hypertension Maternal Grandfather    Hypertension Paternal Grandmother    Heart disease Paternal Grandmother    Hypertension Paternal Grandfather    Heart disease Paternal Grandfather    Anesthesia problems Neg Hx    Hypotension Neg Hx    Pseudochol deficiency Neg Hx    Malignant hyperthermia Neg Hx    Colon cancer Neg Hx    Pancreatic cancer Neg Hx    Esophageal cancer Neg Hx    Stomach cancer Neg Hx    Liver disease Neg Hx     Allergies  Allergen Reactions   Sulfa Antibiotics     Childhood reaction    Medication list has been reviewed and updated.  Current Outpatient Medications on File Prior to Visit  Medication Sig Dispense Refill   cyclobenzaprine (FLEXERIL) 10 MG tablet Take 1 tablet (10 mg total) by mouth 3 (three) times daily as needed for muscle  spasms. 30 tablet 1   famotidine (PEPCID) 40 MG tablet Take 40 mg by mouth daily.     Insulin Pen Needle (PEN NEEDLES) 32G X 5 MM MISC Use one daily with medication injection 100 each 2   traZODone (DESYREL) 50 MG tablet Take 1/2 to 1 tablet (25-50 mg total) by mouth at bedtime as needed for sleep. 30 tablet 3   simvastatin (ZOCOR) 20 MG tablet TAKE 1 TABLET BY MOUTH AT BEDTIME 90 tablet 3   No current facility-administered medications on file prior to visit.    Review of Systems:  As per HPI- otherwise negative.   Physical Examination: Vitals:   09/18/21 0959  BP: 110/82  Pulse: (!) 116  Resp: 18  Temp: 98.6 F (37 C)  SpO2: 97%   Vitals:   09/18/21 0959  Height: 5\' 1"  (1.549 m)   Body mass index is 34.01 kg/m. Ideal Body Weight: Weight in (lb) to have BMI = 25: 132  GEN: no acute  distress.  Nontoxic but does not appear to feel great HEENT: Atraumatic, Normocephalic.  Mild right-sided cervical lymphadenopathy TM within normal limits bilaterally Ears and Nose: No external deformity. CV: RRR, No M/G/R. No JVD. No thrill. No extra heart sounds. PULM: CTA B, no wheezes, crackles, rhonchi. No retractions. No resp. distress. No accessory muscle use. EXTR: No c/c/e PSYCH: Normally interactive. Conversant.  Large erythematous tonsil on the right which extends to the uvula, uvula midline Tonsil is not particularly tender when probed with Q-tip.  I do not believe she has an abscess  Results for orders placed or performed in visit on 09/18/21  POCT rapid strep A  Result Value Ref Range   Rapid Strep A Screen Positive (A) Negative    Assessment and Plan: Strep throat - Plan: penicillin v potassium (VEETID) 500 MG tablet, acetaminophen-codeine (TYLENOL #3) 300-30 MG tablet  Pharyngitis, unspecified etiology - Plan: POCT rapid strep A  Patient seen today with strep throat We will treat with penicillin as above She is having a lot of pain which limits swallowing.  Provided Tylenol No. 3 to use as needed, caution regarding sedation She is asked to please alert me otherwise seek care if not improving the next 1 to 2 days.  I advised patient I do not currently see an abscess, but her right tonsil is quite large.  If she is getting worse please seek care  Signed Lamar Blinks, MD

## 2021-09-18 NOTE — Patient Instructions (Signed)
You have strep!   Start on penicillin asap If you are not improving within 24 hours or so please let me know- seek care if you are getting worse! Tylenol#3 as needed for pain- this will make you drowsy so don't use when you need to drive Drink plenty of fluids

## 2021-10-10 DIAGNOSIS — Z01419 Encounter for gynecological examination (general) (routine) without abnormal findings: Secondary | ICD-10-CM | POA: Diagnosis not present

## 2021-10-10 DIAGNOSIS — Z6834 Body mass index (BMI) 34.0-34.9, adult: Secondary | ICD-10-CM | POA: Diagnosis not present

## 2021-10-10 DIAGNOSIS — Z01411 Encounter for gynecological examination (general) (routine) with abnormal findings: Secondary | ICD-10-CM | POA: Diagnosis not present

## 2021-10-10 DIAGNOSIS — Z1231 Encounter for screening mammogram for malignant neoplasm of breast: Secondary | ICD-10-CM | POA: Diagnosis not present

## 2021-10-10 DIAGNOSIS — Z124 Encounter for screening for malignant neoplasm of cervix: Secondary | ICD-10-CM | POA: Diagnosis not present

## 2021-10-10 DIAGNOSIS — Z113 Encounter for screening for infections with a predominantly sexual mode of transmission: Secondary | ICD-10-CM | POA: Diagnosis not present

## 2021-10-10 DIAGNOSIS — Z30431 Encounter for routine checking of intrauterine contraceptive device: Secondary | ICD-10-CM | POA: Diagnosis not present

## 2021-10-20 DIAGNOSIS — H52223 Regular astigmatism, bilateral: Secondary | ICD-10-CM | POA: Diagnosis not present

## 2021-10-20 DIAGNOSIS — E119 Type 2 diabetes mellitus without complications: Secondary | ICD-10-CM | POA: Diagnosis not present

## 2021-10-20 DIAGNOSIS — H5202 Hypermetropia, left eye: Secondary | ICD-10-CM | POA: Diagnosis not present

## 2021-10-31 ENCOUNTER — Encounter: Payer: Self-pay | Admitting: Family Medicine

## 2021-10-31 ENCOUNTER — Other Ambulatory Visit (HOSPITAL_COMMUNITY): Payer: Self-pay

## 2021-10-31 MED ORDER — CYCLOBENZAPRINE HCL 10 MG PO TABS
10.0000 mg | ORAL_TABLET | Freq: Three times a day (TID) | ORAL | 1 refills | Status: AC | PRN
  Filled 2021-10-31: qty 30, 10d supply, fill #0

## 2021-11-20 ENCOUNTER — Ambulatory Visit
Admission: EM | Admit: 2021-11-20 | Discharge: 2021-11-20 | Disposition: A | Discharge status: Home / Self Care | Payer: 59 | Attending: Internal Medicine | Admitting: Internal Medicine

## 2021-11-20 ENCOUNTER — Other Ambulatory Visit: Payer: Self-pay

## 2021-11-20 DIAGNOSIS — J029 Acute pharyngitis, unspecified: Secondary | ICD-10-CM | POA: Insufficient documentation

## 2021-11-20 DIAGNOSIS — B349 Viral infection, unspecified: Secondary | ICD-10-CM | POA: Diagnosis not present

## 2021-11-20 LAB — POCT RAPID STREP A (OFFICE): Rapid Strep A Screen: NEGATIVE

## 2021-11-20 NOTE — Discharge Instructions (Signed)
Rapid strep test was negative.  Throat culture is pending.  We will call if it is positive. It appears that you have a viral illness that is causing your symptoms.  This is treated with symptomatic treatment with over the counter medications.  Please also ensure adequate fluid hydration.  Follow-up if symptoms persist or worsen. ?

## 2021-11-20 NOTE — ED Triage Notes (Signed)
Patient presents to Urgent Care with complaints of sore throat since Tuesday. Patient reports cough, phlegm, ha, body aches ? ?

## 2021-11-20 NOTE — ED Provider Notes (Signed)
?[facility] ? ? ? ?CSN: [id number] ?Arrival date & time: 11/20/21  1519 ? ? ?  ? ?History   ?Chief Complaint ?Chief Complaint  ?Patient presents with  ? Sore Throat  ? ? ?HPI ?Tricia [name] is a 46 y.o. female.  ? ?Patient presents with sore throat, cough, headache, body aches that started approximately 3 days ago.  Denies any known fevers at home.  Patient reports that her daughter has similar symptoms currently.  Denies chest pain, shortness of breath, ear pain, nausea, vomiting, diarrhea, abdominal pain.  Patient does not report history of asthma or COPD. ? ? ?Sore Throat ? ? ?Past Medical History:  ?Diagnosis Date  ? Allergic rhinitis   ? Anxiety   ? History of chicken pox   ? History of shingles   ? Hyperlipidemia   ? ? ?Patient Active Problem List  ? Diagnosis Date Noted  ? Sprain of ankle 09/08/2021  ? Near syncope 12/07/2018  ? Nonspecific abnormal electrocardiogram (ECG) (EKG) 12/07/2018  ? Closed fracture of base of fifth metacarpal bone of right hand with routine healing, subsequent encounter 12/06/2018  ? Plantar fasciitis of right foot 09/28/2017  ? GAD (generalized anxiety disorder) 05/19/2017  ? Closed fracture of third toe of right foot, with nonunion, subsequent encounter 04/19/2017  ? ? ?Past Surgical History:  ?Procedure Laterality Date  ? CHOLECYSTECTOMY    ? HERNIA REPAIR    ? wisdome teeth    ? ? ?OB History   ? ? Gravida  ?4  ? Para  ?3  ? Term  ?3  ? Preterm  ?   ? AB  ?1  ? Living  ?3  ?  ? ? SAB  ?1  ? IAB  ?   ? Ectopic  ?   ? Multiple  ?   ? Live Births  ?3  ?   ?  ?  ? ? ? ?Home Medications   ? ?Prior to Admission medications   ?Medication Sig Start Date End Date Taking? Authorizing Provider  ?acetaminophen-codeine (TYLENOL #3) 300-30 MG tablet Take 1-2 tablets by mouth every 6 (six) hours as needed for moderate pain. 09/18/21   Copland, Gay Filler, MD  ?cyclobenzaprine (FLEXERIL) 10 MG tablet Take 1 tablet (10 mg total) by mouth 3 (three) times daily as needed for muscle  spasms. 10/31/21   Copland, Gay Filler, MD  ?famotidine (PEPCID) 40 MG tablet Take 40 mg by mouth daily.    [provider]  ?Insulin Pen Needle (PEN NEEDLES) 32G X 5 MM MISC Use one daily with medication injection ?Patient not taking: Reported on 11/20/2021 03/29/20   Copland, Gay Filler, MD  ?penicillin v potassium (VEETID) 500 MG tablet Take 1 tablet (500 mg total) by mouth 2 (two) times daily. ?Patient not taking: Reported on 11/20/2021 09/18/21   Copland, Gay Filler, MD  ?simvastatin (ZOCOR) 20 MG tablet TAKE 1 TABLET BY MOUTH AT BEDTIME ?Patient not taking: Reported on 11/20/2021 07/25/20 07/25/21  Copland, Gay Filler, MD  ?traZODone (DESYREL) 50 MG tablet Take 1/2 to 1 tablet (25-50 mg total) by mouth at bedtime as needed for sleep. 04/30/21   Copland, Gay Filler, MD  ? ? ?Family History ?Family History  ?Problem Relation Age of Onset  ? Hypertension Maternal Grandmother   ? Hypertension Maternal Grandfather   ? Hypertension Paternal Grandmother   ? Heart disease Paternal Grandmother   ? Hypertension Paternal Grandfather   ? Heart disease Paternal Grandfather   ? Anesthesia  problems Neg Hx   ? Hypotension Neg Hx   ? Pseudochol deficiency Neg Hx   ? Malignant hyperthermia Neg Hx   ? Colon cancer Neg Hx   ? Pancreatic cancer Neg Hx   ? Esophageal cancer Neg Hx   ? Stomach cancer Neg Hx   ? Liver disease Neg Hx   ? ? ?Social History ?Social History  ? ?Tobacco Use  ? Smoking status: Never  ?  Passive exposure: Never  ? Smokeless tobacco: Never  ?Vaping Use  ? Vaping Use: Never used  ?Substance Use Topics  ? Alcohol use: Yes  ?  Comment: occ  ? Drug use: No  ? ? ? ?Allergies   ?Sulfa antibiotics ? ? ?Review of Systems ?Review of Systems ?Per HPI ? ?Physical Exam ?Triage Vital Signs ?ED Triage Vitals  ?Enc Vitals Group  ?   BP 11/20/21 1536 (!) 141/95  ?   Pulse Rate 11/20/21 1536 98  ?   Resp 11/20/21 1536 18  ?   Temp 11/20/21 1536 98.5 ?F (36.9 ?C)  ?   Temp src --   ?   SpO2 11/20/21 1536 95 %  ?   Weight 11/20/21  1534 185 lb (83.9 kg)  ?   Height 11/20/21 1534 '5\' 2"'$  (1.575 m)  ?   Head Circumference --   ?   Peak Flow --   ?   Pain Score 11/20/21 1534 5  ?   Pain Loc --   ?   Pain Edu? --   ?   Excl. in [place]? --   ? ?No data found. ? ?Updated Vital Signs ?BP (!) 141/95 (BP Location: Right Arm)   Pulse 98   Temp 98.5 ?F (36.9 ?C)   Resp 18   Ht '5\' 2"'$  (1.575 m)   Wt 185 lb (83.9 kg)   SpO2 95%   BMI 33.84 kg/m?  ? ?Visual Acuity ?Right Eye Distance:   ?Left Eye Distance:   ?Bilateral Distance:   ? ?Right Eye Near:   ?Left Eye Near:    ?Bilateral Near:    ? ?Physical Exam ?Constitutional:   ?   General: She is not in acute distress. ?   Appearance: Normal appearance. She is not toxic-appearing or diaphoretic.  ?HENT:  ?   Head: Normocephalic and atraumatic.  ?   Right Ear: Tympanic membrane and ear canal normal.  ?   Left Ear: Tympanic membrane and ear canal normal.  ?   Nose: Congestion present.  ?   Mouth/Throat:  ?   Mouth: Mucous membranes are moist.  ?   Pharynx: Posterior oropharyngeal erythema present. No oropharyngeal exudate.  ?   Tonsils: No tonsillar exudate.  ?Eyes:  ?   Extraocular Movements: Extraocular movements intact.  ?   Conjunctiva/sclera: Conjunctivae normal.  ?   Pupils: Pupils are equal, round, and reactive to light.  ?Cardiovascular:  ?   Rate and Rhythm: Normal rate and regular rhythm.  ?   Pulses: Normal pulses.  ?   Heart sounds: Normal heart sounds.  ?Pulmonary:  ?   Effort: Pulmonary effort is normal. No respiratory distress.  ?   Breath sounds: Normal breath sounds. No wheezing.  ?Abdominal:  ?   General: Abdomen is flat. Bowel sounds are normal.  ?   Palpations: Abdomen is soft.  ?Musculoskeletal:     ?   General: Normal range of motion.  ?   Cervical back: Normal range of motion.  ?Skin: ?  General: Skin is warm and dry.  ?Neurological:  ?   General: No focal deficit present.  ?   Mental Status: She is alert and oriented to person, place, and time. Mental status is at baseline.   ?Psychiatric:     ?   Mood and Affect: Mood normal.     ?   Behavior: Behavior normal.  ? ? ? ?UC Treatments / Results  ?Labs ?(all labs ordered are listed, but only abnormal results are displayed) ?Labs Reviewed  ?POCT RAPID STREP A (OFFICE)  ? ? ?EKG ? ? ?Radiology ?No results found. ? ?Procedures ?Procedures (including critical care time) ? ?Medications Ordered in UC ?Medications - No data to display ? ?Initial Impression / Assessment and Plan / UC Course  ?I have reviewed the triage vital signs and the nursing notes. ? ?Pertinent labs & imaging results that were available during my care of the patient were reviewed by me and considered in my medical decision making (see chart for details). ? ?  ? ?Rapid strep was negative.  Throat culture is pending.  Symptoms appear viral in etiology.  Low suspicion for strep throat at this time.  No signs of peritonsillar abscess on exam.  Patient declined COVID testing.  Discussed supportive care and symptom management with patient.  Discussed return precautions.  Patient verbalized understanding and was agreeable with plan. ?Final Clinical Impressions(s) / UC Diagnoses  ? ?Final diagnoses:  ?None  ? ?Discharge Instructions   ?None ?  ? ?ED Prescriptions   ?None ?  ? ?PDMP not reviewed this encounter. ?  ?Teodora Medici, [place] ?11/20/21 1557 ? ?

## 2021-11-21 ENCOUNTER — Other Ambulatory Visit (HOSPITAL_COMMUNITY): Payer: Self-pay

## 2021-11-21 ENCOUNTER — Other Ambulatory Visit: Payer: Self-pay | Admitting: Obstetrics

## 2021-11-21 DIAGNOSIS — J020 Streptococcal pharyngitis: Secondary | ICD-10-CM

## 2021-11-21 MED ORDER — PENICILLIN V POTASSIUM 500 MG PO TABS
500.0000 mg | ORAL_TABLET | Freq: Four times a day (QID) | ORAL | 2 refills | Status: DC
  Filled 2021-11-21: qty 30, 8d supply, fill #0

## 2021-11-22 LAB — CULTURE, GROUP A STREP (THRC)

## 2021-11-28 NOTE — Patient Instructions (Addendum)
Good to see you again today- I will be in touch with your labs asap ?We will check on your lipids and I will request records from your OBG ? ?Try using trazodone nightly and see if this helps with depression and anxiety sx. We can try going up to 100 mg if you like, or do something else if not helpful ? ?Please ask your insurance co if they cover any GLP-1 drugs for weight loss.  If they do I am glad to rx for you ? ?Keep working on exercise  ?

## 2021-11-28 NOTE — Progress Notes (Addendum)
Therapist, music at [facility] ?[address], [address] ?[place], [place] [address] ?336 [phone] ?Fax [phone] ? ?Date:  12/01/2021  ? ?Name:  Tricia [name]   DOB:  [birth year]   MRN:  [id number] ? ?PCP:  [name], MD  ? ? ?Chief Complaint: Annual Exam (Concerns/ questions: none/Microalbumin due/Pap/ Mammogram: [facility]) ? ? ?History of Present Illness: ? ?Tricia [name] is a 46 y.o. very pleasant female patient who presents with the following: ? ?Seen today for a CPE- history of dyslipidemia, prediabetes, obesity  ?Last seen by myself in February for strep -she recovered fully ?History of B12 deficiency -not currently taking B12 ?Her oldest is graduating from [employer], they are not sure what he will do next  ?She also has a 86 and 45 year old!  She is quite busy ?Finding time for exercise can be a challenge ? ?Pap- OBG- done in March  ?Covid booster ?Mammogram-OBG, done in March  ?Labs done 8/22; she is fasting this am  ? ?She notes some medium depression and anxiety sx- she has tried Brewing technologist and prozac- did not work well for her  ?She uses trazodone as needed but not all the time - she will try taking this daily for a month or so, and see how this works for her  ? ?She is not taking zocor right now- off for about 6 months  will check on her lipids today  ? ?Lab Results  ?Component Value Date  ? HGBA1C 5.5 11/18/2020  ? ? ? ?Patient Active Problem List  ? Diagnosis Date Noted  ? Sprain of ankle 09/08/2021  ? Near syncope 12/07/2018  ? Nonspecific abnormal electrocardiogram (ECG) (EKG) 12/07/2018  ? Closed fracture of base of fifth metacarpal bone of right hand with routine healing, subsequent encounter 12/06/2018  ? Plantar fasciitis of right foot 09/28/2017  ? GAD (generalized anxiety disorder) 05/19/2017  ? Closed fracture of third toe of right foot, with nonunion, subsequent encounter 04/19/2017  ? ? ?Past Medical History:  ?Diagnosis Date  ? Allergic rhinitis   ? Anxiety   ?  History of chicken pox   ? History of shingles   ? Hyperlipidemia   ? ? ?Past Surgical History:  ?Procedure Laterality Date  ? CHOLECYSTECTOMY    ? HERNIA REPAIR    ? wisdome teeth    ? ? ?Social History  ? ?Tobacco Use  ? Smoking status: Never  ?  Passive exposure: Never  ? Smokeless tobacco: Never  ?Vaping Use  ? Vaping Use: Never used  ?Substance Use Topics  ? Alcohol use: Yes  ?  Comment: occ  ? Drug use: No  ? ? ?Family History  ?Problem Relation Age of Onset  ? Hypertension Maternal Grandmother   ? Hypertension Maternal Grandfather   ? Hypertension Paternal Grandmother   ? Heart disease Paternal Grandmother   ? Hypertension Paternal Grandfather   ? Heart disease Paternal Grandfather   ? Anesthesia problems Neg Hx   ? Hypotension Neg Hx   ? Pseudochol deficiency Neg Hx   ? Malignant hyperthermia Neg Hx   ? Colon cancer Neg Hx   ? Pancreatic cancer Neg Hx   ? Esophageal cancer Neg Hx   ? Stomach cancer Neg Hx   ? Liver disease Neg Hx   ? ? ?Allergies  ?Allergen Reactions  ? Sulfa Antibiotics   ?  Childhood reaction  ? ? ?Medication list has been reviewed and updated. ? ?Current Outpatient  Medications on File Prior to Visit  ?Medication Sig Dispense Refill  ? acetaminophen-codeine (TYLENOL #3) 300-30 MG tablet Take 1-2 tablets by mouth every 6 (six) hours as needed for moderate pain. 20 tablet 0  ? cyclobenzaprine (FLEXERIL) 10 MG tablet Take 1 tablet (10 mg total) by mouth 3 (three) times daily as needed for muscle spasms. 30 tablet 1  ? famotidine (PEPCID) 40 MG tablet Take 40 mg by mouth daily.    ? Insulin Pen Needle (PEN NEEDLES) 32G X 5 MM MISC Use one daily with medication injection 100 each 2  ? traZODone (DESYREL) 50 MG tablet Take 1/2 to 1 tablet (25-50 mg total) by mouth at bedtime as needed for sleep. 30 tablet 3  ? simvastatin (ZOCOR) 20 MG tablet TAKE 1 TABLET BY MOUTH AT BEDTIME (Patient not taking: Reported on 11/20/2021) 90 tablet 3  ? ?No current facility-administered medications on file prior to  visit.  ? ? ?Review of Systems: ? ?As per HPI- otherwise negative. ? ? ?Physical Examination: ?Vitals:  ? 12/01/21 1050  ?BP: 114/70  ?Pulse: 65  ?Temp: 98.4 ?F (36.9 ?C)  ?SpO2: 97%  ? ?Vitals:  ? 12/01/21 1050  ?Weight: 185 lb 12.8 oz (84.3 kg)  ?Height: '5\' 1"'$  (1.549 m)  ? ?Body mass index is 35.11 kg/m?. ?Ideal Body Weight: Weight in (lb) to have BMI = 25: 132 ? ?GEN: no acute distress.  Obese, looks well ?HEENT: Atraumatic, Normocephalic.  Bilateral TM wnl, oropharynx normal.  PEERL,EOMI.   ?Ears and Nose: No external deformity. ?CV: RRR, No M/G/R. No JVD. No thrill. No extra heart sounds. ?PULM: CTA B, no wheezes, crackles, rhonchi. No retractions. No resp. distress. No accessory muscle use. ?ABD: S, NT, ND. No rebound. No HSM. ?EXTR: No c/c/e ?PSYCH: Normally interactive. Conversant.  ? ? ?Assessment and Plan: ?Physical exam ? ?Dyslipidemia - Plan: Lipid panel ? ?Prediabetes - Plan: Comprehensive metabolic panel, Hemoglobin A1c ? ?Screening for thyroid disorder - Plan: TSH ? ?Screening, deficiency anemia, iron - Plan: CBC ? ?Family history of B12 deficiency - Plan: B12 ? ?Patient seen today for physical exam ?Encouraged healthy diet and exercise routine.  She has tried [place] before for weight loss, had bothersome side effects.  She will ask her insurance company if they cover any alternative GLP-1, I am glad to prescribe it so ? ?She notes mild to moderate symptoms of depression and anxiety.  We will have her try taking trazodone nightly for about a month and see if helpful.  Can potentially go up on dosage if she likes ? ?Will plan further follow- up pending labs. ? ? ?Signed ?Lamar Blinks, MD ? ?Received labs as below, message to patient ? ?Results for orders placed or performed in visit on 12/01/21  ?CBC  ?Result Value Ref Range  ? WBC 8.6 4.0 - 10.5 K/uL  ? RBC 4.53 3.87 - 5.11 Mil/uL  ? Platelets 315.0 150.0 - 400.0 K/uL  ? Hemoglobin 14.8 12.0 - 15.0 g/dL  ? HCT 43.6 36.0 - 46.0 %  ? MCV 96.3 78.0 -  100.0 fl  ? MCHC 34.0 30.0 - 36.0 g/dL  ? RDW 13.0 11.5 - 15.5 %  ?Comprehensive metabolic panel  ?Result Value Ref Range  ? Sodium 139 135 - 145 mEq/L  ? Potassium 4.3 3.5 - 5.1 mEq/L  ? Chloride 103 96 - 112 mEq/L  ? CO2 27 19 - 32 mEq/L  ? Glucose, Bld 99 70 - 99 mg/dL  ? BUN 11 6 -  23 mg/dL  ? Creatinine, Ser 0.73 0.40 - 1.20 mg/dL  ? Total Bilirubin 0.7 0.2 - 1.2 mg/dL  ? Alkaline Phosphatase 80 39 - 117 U/L  ? AST 20 0 - 37 U/L  ? ALT 18 0 - 35 U/L  ? Total Protein 7.2 6.0 - 8.3 g/dL  ? Albumin 4.2 3.5 - 5.2 g/dL  ? GFR 99.93 >60.00 mL/min  ? Calcium 9.3 8.4 - 10.5 mg/dL  ?Hemoglobin A1c  ?Result Value Ref Range  ? Hgb A1c MFr Bld 5.9 4.6 - 6.5 %  ?Lipid panel  ?Result Value Ref Range  ? Cholesterol 243 (H) 0 - 200 mg/dL  ? Triglycerides 216.0 (H) 0.0 - 149.0 mg/dL  ? HDL 59.40 >39.00 mg/dL  ? VLDL 43.2 (H) 0.0 - 40.0 mg/dL  ? Total CHOL/HDL Ratio 4   ? NonHDL 184.05   ?TSH  ?Result Value Ref Range  ? TSH 0.91 0.35 - 5.50 uIU/mL  ?B12  ?Result Value Ref Range  ? Vitamin B-12 213 211 - 911 pg/mL  ?LDL cholesterol, direct  ?Result Value Ref Range  ? Direct LDL 162.0 mg/dL  ? ? ? ?

## 2021-12-01 ENCOUNTER — Ambulatory Visit (INDEPENDENT_AMBULATORY_CARE_PROVIDER_SITE_OTHER): Payer: 59 | Admitting: Family Medicine

## 2021-12-01 ENCOUNTER — Encounter: Payer: Self-pay | Admitting: Family Medicine

## 2021-12-01 VITALS — BP 114/70 | HR 65 | Temp 98.4°F | Ht 61.0 in | Wt 185.8 lb

## 2021-12-01 DIAGNOSIS — R7303 Prediabetes: Secondary | ICD-10-CM | POA: Diagnosis not present

## 2021-12-01 DIAGNOSIS — Z0000 Encounter for general adult medical examination without abnormal findings: Secondary | ICD-10-CM | POA: Diagnosis not present

## 2021-12-01 DIAGNOSIS — Z130 Encounter for screening for diseases of the blood and blood-forming organs and certain disorders involving the immune mechanism: Secondary | ICD-10-CM | POA: Diagnosis not present

## 2021-12-01 DIAGNOSIS — Z8349 Family history of other endocrine, nutritional and metabolic diseases: Secondary | ICD-10-CM

## 2021-12-01 DIAGNOSIS — Z1329 Encounter for screening for other suspected endocrine disorder: Secondary | ICD-10-CM

## 2021-12-01 DIAGNOSIS — E785 Hyperlipidemia, unspecified: Secondary | ICD-10-CM

## 2021-12-01 LAB — COMPREHENSIVE METABOLIC PANEL
ALT: 18 U/L (ref 0–35)
AST: 20 U/L (ref 0–37)
Albumin: 4.2 g/dL (ref 3.5–5.2)
Alkaline Phosphatase: 80 U/L (ref 39–117)
BUN: 11 mg/dL (ref 6–23)
CO2: 27 mEq/L (ref 19–32)
Calcium: 9.3 mg/dL (ref 8.4–10.5)
Chloride: 103 mEq/L (ref 96–112)
Creatinine, Ser: 0.73 mg/dL (ref 0.40–1.20)
GFR: 99.93 mL/min (ref >60.00)
Glucose, Bld: 99 mg/dL (ref 70–99)
Potassium: 4.3 mEq/L (ref 3.5–5.1)
Sodium: 139 mEq/L (ref 135–145)
Total Bilirubin: 0.7 mg/dL (ref 0.2–1.2)
Total Protein: 7.2 g/dL (ref 6.0–8.3)

## 2021-12-01 LAB — LIPID PANEL
Cholesterol: 243 mg/dL — ABNORMAL HIGH (ref 0–200)
HDL: 59.4 mg/dL (ref >39.00)
NonHDL: 184.05
Total CHOL/HDL Ratio: 4
Triglycerides: 216 mg/dL — ABNORMAL HIGH (ref 0.0–149.0)
VLDL: 43.2 mg/dL — ABNORMAL HIGH (ref 0.0–40.0)

## 2021-12-01 LAB — CBC
HCT: 43.6 % (ref 36.0–46.0)
Hemoglobin: 14.8 g/dL (ref 12.0–15.0)
MCHC: 34 g/dL (ref 30.0–36.0)
MCV: 96.3 fl (ref 78.0–100.0)
Platelets: 315 10*3/uL (ref 150.0–400.0)
RBC: 4.53 Mil/uL (ref 3.87–5.11)
RDW: 13 % (ref 11.5–15.5)
WBC: 8.6 10*3/uL (ref 4.0–10.5)

## 2021-12-01 LAB — LDL CHOLESTEROL, DIRECT: Direct LDL: 162 mg/dL

## 2021-12-01 LAB — HEMOGLOBIN A1C: Hgb A1c MFr Bld: 5.9 % (ref 4.6–6.5)

## 2021-12-01 LAB — VITAMIN B12: Vitamin B-12: 213 pg/mL (ref 211–911)

## 2021-12-01 LAB — TSH: TSH: 0.91 u[IU]/mL (ref 0.35–5.50)

## 2021-12-04 ENCOUNTER — Other Ambulatory Visit: Payer: Self-pay | Admitting: Family Medicine

## 2021-12-04 ENCOUNTER — Other Ambulatory Visit (HOSPITAL_COMMUNITY): Payer: Self-pay

## 2021-12-04 DIAGNOSIS — E785 Hyperlipidemia, unspecified: Secondary | ICD-10-CM

## 2021-12-04 MED ORDER — SIMVASTATIN 20 MG PO TABS
ORAL_TABLET | Freq: Every day | ORAL | 3 refills | Status: DC
  Filled 2021-12-04: qty 90, 90d supply, fill #0

## 2021-12-06 ENCOUNTER — Encounter: Payer: Self-pay | Admitting: Family Medicine

## 2021-12-09 ENCOUNTER — Encounter: Payer: Self-pay | Admitting: Family Medicine

## 2021-12-09 ENCOUNTER — Other Ambulatory Visit (HOSPITAL_COMMUNITY): Payer: Self-pay

## 2021-12-09 DIAGNOSIS — E669 Obesity, unspecified: Secondary | ICD-10-CM

## 2021-12-09 MED ORDER — BUSPIRONE HCL 7.5 MG PO TABS
7.5000 mg | ORAL_TABLET | Freq: Two times a day (BID) | ORAL | 3 refills | Status: DC
  Filled 2021-12-09: qty 120, 34d supply, fill #0

## 2021-12-09 NOTE — Addendum Note (Signed)
Addended by: Lamar Blinks C on: 12/09/2021 05:51 PM ? ? Modules accepted: Orders ? ?

## 2021-12-10 ENCOUNTER — Other Ambulatory Visit (HOSPITAL_COMMUNITY): Payer: Self-pay

## 2021-12-11 ENCOUNTER — Other Ambulatory Visit (HOSPITAL_COMMUNITY): Payer: Self-pay

## 2021-12-11 MED ORDER — WEGOVY 0.25 MG/0.5ML ~~LOC~~ SOAJ
SUBCUTANEOUS | 1 refills | Status: DC
  Filled 2021-12-11 – 2021-12-26 (×2): qty 2, 28d supply, fill #0
  Filled 2022-02-19: qty 2, 28d supply, fill #1

## 2021-12-12 ENCOUNTER — Telehealth: Payer: Self-pay

## 2021-12-12 NOTE — Telephone Encounter (Signed)
Tricia [name] (Key: [id number]) ?JQZESP 0.'25MG'$ /0.5ML auto-injectors ?  ?Form ?MedImpact ePA Form 2017 NCPDP ?

## 2021-12-17 NOTE — Telephone Encounter (Signed)
Your prior authorization request has been denied. ?COMPLETE E-APPEAL ?Your request for prior authorization was denied, but an Electronic Appeal is available for your patient. Complete the questions in the Appeal section at the bottom of this page to pursue the appeal. For assistance, contact our support team at [phone]. ? ?Message from plan: This request has not been approved. Based on the information submitted for review, you did not meet our guideline rules for the requested drug. In order for your request to be approved, your provider would need to show that you have met the guideline rules below. The details below are written in medical language. If you have questions, please contact your provider. In some cases, the requested medication or alternatives offered may have additional approval requirements. For the treatment of obesity, the guideline named ANTI-OBESITY AGENTS (which includes [employer]) requires that you are actively enrolled in an exercise and caloric reduction program or a weight loss/behavioral modification program. This request was denied because your doctor did not tell us that you have met this requirement. Please work with your doctor to use a different medication or get [place] more information if it will allow [place] to approve this request. A written notification letter will follow with additional details. ?

## 2021-12-18 ENCOUNTER — Other Ambulatory Visit (HOSPITAL_COMMUNITY): Payer: Self-pay

## 2021-12-18 NOTE — Telephone Encounter (Signed)
Letter has been faxed to [place] for revivew- with this information.  ?

## 2021-12-24 NOTE — Telephone Encounter (Signed)
FYI:  Appeal was approved for a max of 7 fills from 12/24/21 through 07/22/22.  ?

## 2021-12-26 ENCOUNTER — Other Ambulatory Visit (HOSPITAL_COMMUNITY): Payer: Self-pay

## 2022-01-07 ENCOUNTER — Other Ambulatory Visit (HOSPITAL_COMMUNITY): Payer: Self-pay

## 2022-01-13 ENCOUNTER — Other Ambulatory Visit (HOSPITAL_COMMUNITY): Payer: Self-pay

## 2022-01-15 ENCOUNTER — Other Ambulatory Visit (HOSPITAL_COMMUNITY): Payer: Self-pay

## 2022-02-19 ENCOUNTER — Other Ambulatory Visit (HOSPITAL_COMMUNITY): Payer: Self-pay

## 2022-03-05 ENCOUNTER — Telehealth: Payer: 59 | Admitting: Emergency Medicine

## 2022-03-05 ENCOUNTER — Other Ambulatory Visit (HOSPITAL_COMMUNITY): Payer: Self-pay

## 2022-03-05 DIAGNOSIS — R051 Acute cough: Secondary | ICD-10-CM

## 2022-03-05 MED ORDER — BENZONATATE 100 MG PO CAPS
100.0000 mg | ORAL_CAPSULE | Freq: Two times a day (BID) | ORAL | 0 refills | Status: DC | PRN
  Filled 2022-03-05: qty 20, 10d supply, fill #0

## 2022-03-05 MED ORDER — AZITHROMYCIN 250 MG PO TABS
ORAL_TABLET | ORAL | 0 refills | Status: DC
  Filled 2022-03-05: qty 6, 5d supply, fill #0

## 2022-03-05 NOTE — Progress Notes (Signed)
We are sorry that you are not feeling well.  Here is how we plan to help!  Based on your presentation I believe you most likely have A cough due to bacteria.  When patients have a fever and a productive cough with a change in color or increased sputum production, we are concerned about bacterial bronchitis.  If left untreated it can progress to pneumonia.  If your symptoms do not improve with your treatment plan it is important that you contact your provider.   I have prescribed Azithromyin 250 mg: two tablets now and then one tablet daily for 4 additonal days    In addition you may use A prescription cough medication called Tessalon Perles '100mg'$ . You may take 1-2 capsules every 8 hours as needed for your cough.  It is still very possible that your symptoms are viral and the above treatment will not work.  If this is the case, you'll have to ride it out with symptomatic care only such as OTC Nyquil/Dayquil.  Hopefully, the antibiotic will work.  From your responses in the eVisit questionnaire you describe inflammation in the upper respiratory tract which is causing a significant cough.  This is commonly called Bronchitis and has four common causes:   Allergies Viral Infections Acid Reflux Bacterial Infection Allergies, viruses and acid reflux are treated by controlling symptoms or eliminating the cause. An example might be a cough caused by taking certain blood pressure medications. You stop the cough by changing the medication. Another example might be a cough caused by acid reflux. Controlling the reflux helps control the cough.   HOME CARE Only take medications as instructed by your medical team. Complete the entire course of an antibiotic. Drink plenty of fluids and get plenty of rest. Avoid close contacts especially the very young and the elderly Cover your mouth if you cough or cough into your sleeve. Always remember to wash your hands A steam or ultrasonic humidifier can help  congestion.   GET HELP RIGHT AWAY IF: You develop worsening fever. You become short of breath You cough up blood. Your symptoms persist after you have completed your treatment plan MAKE SURE YOU  Understand these instructions. Will watch your condition. Will get help right away if you are not doing well or get worse.    Thank you for choosing an e-visit.  Your e-visit answers were reviewed by a board certified advanced clinical practitioner to complete your personal care plan. Depending upon the condition, your plan could have included both over the counter or prescription medications.  Please review your pharmacy choice. Make sure the pharmacy is open so you can pick up prescription now. If there is a problem, you may contact your provider through [employer] and have the prescription routed to another pharmacy.  Your safety is important to [place]. If you have drug allergies check your prescription carefully.   For the next 24 hours you can use MyChart to ask questions about today's visit, request a non-urgent call back, or ask for a work or school excuse. You will get an email in the next two days asking about your experience. I hope that your e-visit has been valuable and will speed your recovery.  Approximately 5 minutes was used in reviewing the patient's chart, questionnaire, prescribing medications, and documentation.

## 2022-05-07 ENCOUNTER — Encounter (HOSPITAL_COMMUNITY): Payer: Self-pay | Admitting: Pharmacist

## 2022-05-07 ENCOUNTER — Other Ambulatory Visit: Payer: Self-pay | Admitting: Family Medicine

## 2022-05-07 ENCOUNTER — Other Ambulatory Visit (HOSPITAL_COMMUNITY): Payer: Self-pay

## 2022-05-07 DIAGNOSIS — E669 Obesity, unspecified: Secondary | ICD-10-CM

## 2022-05-07 MED ORDER — WEGOVY 0.5 MG/0.5ML ~~LOC~~ SOAJ
0.5000 mg | SUBCUTANEOUS | 0 refills | Status: DC
  Filled 2022-05-07 – 2022-08-24 (×3): qty 2, 28d supply, fill #0

## 2022-05-09 ENCOUNTER — Other Ambulatory Visit (HOSPITAL_COMMUNITY): Payer: Self-pay

## 2022-05-20 ENCOUNTER — Other Ambulatory Visit (HOSPITAL_COMMUNITY): Payer: Self-pay

## 2022-07-14 ENCOUNTER — Other Ambulatory Visit (HOSPITAL_COMMUNITY): Payer: Self-pay

## 2022-07-30 ENCOUNTER — Other Ambulatory Visit (HOSPITAL_COMMUNITY): Payer: Self-pay

## 2022-07-31 ENCOUNTER — Telehealth: Payer: 59 | Admitting: Emergency Medicine

## 2022-07-31 ENCOUNTER — Other Ambulatory Visit (HOSPITAL_COMMUNITY): Payer: Self-pay

## 2022-07-31 DIAGNOSIS — J069 Acute upper respiratory infection, unspecified: Secondary | ICD-10-CM

## 2022-07-31 MED ORDER — AZELASTINE HCL 0.1 % NA SOLN
2.0000 | Freq: Two times a day (BID) | NASAL | 0 refills | Status: DC
  Filled 2022-07-31: qty 30, 25d supply, fill #0

## 2022-07-31 MED ORDER — BENZONATATE 100 MG PO CAPS
100.0000 mg | ORAL_CAPSULE | Freq: Two times a day (BID) | ORAL | 0 refills | Status: DC | PRN
  Filled 2022-07-31: qty 20, 10d supply, fill #0

## 2022-07-31 NOTE — Progress Notes (Signed)
E-Visit for Upper Respiratory Infection   We are sorry you are not feeling well.  Here is how we plan to help!  Based on what you have shared with me, it looks like you may have a viral upper respiratory infection.  Upper respiratory infections are caused by a large number of viruses; however, rhinovirus is the most common cause.   Symptoms vary from person to person, with common symptoms including sore throat, cough, fatigue or lack of energy and feeling of general discomfort.  A low-grade fever of up to 100.4 may present, but is often uncommon.  Symptoms vary however, and are closely related to a person's age or underlying illnesses.  The most common symptoms associated with an upper respiratory infection are nasal discharge or congestion, cough, sneezing, headache and pressure in the ears and face.  These symptoms usually persist for about 3 to 10 days, but can last up to 2 weeks.  It is important to know that upper respiratory infections do not cause serious illness or complications in most cases.    Antibiotics are not recommended by the Infectious Disease Society of [place] unless you have severe symptoms (including high fever) or you have symptoms for more than 10 days. If you still have symptoms after 10 days, antibiotics should be considered.    Upper respiratory infections can be transmitted from person to person, with the most common method of transmission being a person's hands.  The virus is able to live on the skin and can infect other persons for up to 2 hours after direct contact.  Also, these can be transmitted when someone coughs or sneezes; thus, it is important to cover the mouth to reduce this risk.  To keep the spread of the illness at [place], good hand hygiene is very important.  This is an infection that is most likely caused by a virus. There are no specific treatments other than to help you with the symptoms until the infection runs its course.  We are sorry you are not feeling  well.  Here is how we plan to help!   For nasal congestion, you may use plain Mucinex in addition to the other medicines you are using.    Saline nasal spray or nasal drops can help and can safely be used as often as needed for congestion.  Try using saline irrigation, such as with a neti pot, several times a day while you are sick. Many neti pots come with salt packets premeasured to use to make saline. If you use your own salt, make sure it is kosher salt or sea salt (don't use table salt as it has iodine in it and you don't need that in your nose). Use distilled water to make saline. If you mix your own saline using your own salt, the recipe is 1/4 teaspoon salt in 1 cup warm water. Using saline irrigation can help prevent and treat sinus infections.   For your congestion, I have prescribed Azelastine nasal spray two sprays in each nostril twice a day  If you do not have a history of heart disease, hypertension, diabetes or thyroid disease, prostate/bladder issues or glaucoma, you may also use Sudafed to treat nasal congestion.  It is highly recommended that you consult with a pharmacist or your primary care physician to ensure this medication is safe for you to take.     If you have a cough, you may use cough suppressants such as Delsym and Robitussin.  If you have glaucoma  or high blood pressure, you can also use Coricidin HBP.   For cough I have prescribed for you A prescription cough medication called Tessalon Perles 100 mg. You may take 1-2 capsules every 8 hours as needed for cough  If you have a sore or scratchy throat, use a saltwater gargle-  to  teaspoon of salt dissolved in a 4-ounce to 8-ounce glass of warm water.  Gargle the solution for approximately 15-30 seconds and then spit.  It is important not to swallow the solution.  You can also use throat lozenges/cough drops and Chloraseptic spray to help with throat pain or discomfort.  Warm or cold liquids can also be helpful in  relieving throat pain.  For headache, pain or general discomfort, you can use Ibuprofen or Tylenol as directed.   Some authorities believe that zinc sprays or the use of Echinacea may shorten the course of your symptoms.   HOME CARE Only take medications as instructed by your medical team. Be sure to drink plenty of fluids. Water is fine as well as fruit juices, sodas and electrolyte beverages. You may want to stay away from caffeine or alcohol. If you are nauseated, try taking small sips of liquids. How do you know if you are getting enough fluid? Your urine should be a pale yellow or almost colorless. Get rest. Taking a steamy shower or using a humidifier may help nasal congestion and ease sore throat pain. You can place a towel over your head and breathe in the steam from hot water coming from a faucet. Using a saline nasal spray works much the same way. Cough drops, hard candies and sore throat lozenges may ease your cough. Avoid close contacts especially the very young and the elderly Cover your mouth if you cough or sneeze Always remember to wash your hands.   GET HELP RIGHT AWAY IF: You develop worsening fever. If your symptoms do not improve within 10 days You develop yellow or green discharge from your nose over 3 days. You have coughing fits You develop a severe head ache or visual changes. You develop shortness of breath, difficulty breathing or start having chest pain Your symptoms persist after you have completed your treatment plan  MAKE SURE YOU  Understand these instructions. Will watch your condition. Will get help right away if you are not doing well or get worse.  Thank you for choosing an e-visit.  Your e-visit answers were reviewed by a board certified advanced clinical practitioner to complete your personal care plan. Depending upon the condition, your plan could have included both over the counter or prescription medications.  Please review your pharmacy  choice. Make sure the pharmacy is open so you can pick up prescription now. If there is a problem, you may contact your provider through [employer] and have the prescription routed to another pharmacy.  Your safety is important to [place]. If you have drug allergies check your prescription carefully.   For the next 24 hours you can use MyChart to ask questions about today's visit, request a non-urgent call back, or ask for a work or school excuse. You will get an email in the next two days asking about your experience. I hope that your e-visit has been valuable and will speed your recovery.  I have spent 5 minutes in review of e-visit questionnaire, review and updating patient chart, medical decision making and response to patient.   Willeen Cass, [name], FNP-BC

## 2022-08-15 ENCOUNTER — Other Ambulatory Visit (HOSPITAL_COMMUNITY): Payer: Self-pay

## 2022-08-24 ENCOUNTER — Other Ambulatory Visit (HOSPITAL_COMMUNITY): Payer: Self-pay

## 2022-08-25 ENCOUNTER — Other Ambulatory Visit: Payer: Self-pay

## 2022-09-02 ENCOUNTER — Other Ambulatory Visit: Payer: Self-pay

## 2022-11-11 LAB — HM PAP SMEAR

## 2022-11-11 LAB — RESULTS CONSOLE HPV: CHL HPV: NEGATIVE

## 2022-11-28 DIAGNOSIS — E538 Deficiency of other specified B group vitamins: Secondary | ICD-10-CM | POA: Insufficient documentation

## 2022-11-28 DIAGNOSIS — R7303 Prediabetes: Secondary | ICD-10-CM | POA: Insufficient documentation

## 2022-11-28 NOTE — Patient Instructions (Incomplete)
It was great to see again today, I will be in touch with your lab Recommend COVID booster if not done in the last 9 months or so  We will set you up to see Dr Pearletha Forge again for your plantar fascitis  [facility] [address] [place], [place] [address] [phone]  I will also check labs to look for any auto-immune disorder.  If all is normal we might want to try something like cymbalta for FBM pain

## 2022-11-28 NOTE — Progress Notes (Unsigned)
[name] [address], [address] [place], [place] [address] [phone]  Date:  11/30/2022   Name:  Tricia [name]   DOB:  05-09-1976   MRN:  [id number]  PCP:  Pearline Cables, MD    Chief Complaint: No chief complaint on file.   History of Present Illness:  Tricia [name] is a 47 y.o. very pleasant female patient who presents with the following:  Patient seen today for physical Most recent visit with myself about 1 year ago- history of dyslipidemia, prediabetes, obesity, B12 deficiency  Most recent labs 1 year ago Mammogram Pap screening Colon cancer screening Lab Results  Component Value Date   HGBA1C 5.9 12/01/2021     Patient Active Problem List   Diagnosis Date Noted   Sprain of ankle 09/08/2021   Near syncope 12/07/2018   Nonspecific abnormal electrocardiogram (ECG) (EKG) 12/07/2018   Closed fracture of base of fifth metacarpal bone of right hand with routine healing, subsequent encounter 12/06/2018   Plantar fasciitis of right foot 09/28/2017   GAD (generalized anxiety disorder) 05/19/2017   Closed fracture of third toe of right foot, with nonunion, subsequent encounter 04/19/2017    Past Medical History:  Diagnosis Date   Allergic rhinitis    Anxiety    History of chicken pox    History of shingles    Hyperlipidemia     Past Surgical History:  Procedure Laterality Date   CHOLECYSTECTOMY     HERNIA REPAIR     wisdome teeth      Social History   Tobacco Use   Smoking status: Never    Passive exposure: Never   Smokeless tobacco: Never  Vaping Use   Vaping Use: Never used  Substance Use Topics   Alcohol use: Yes    Comment: occ   Drug use: No    Family History  Problem Relation Age of Onset   Hypertension Maternal Grandmother    Hypertension Maternal Grandfather    Hypertension Paternal Grandmother    Heart disease Paternal Grandmother    Hypertension Paternal Grandfather     Heart disease Paternal Grandfather    Anesthesia problems Neg Hx    Hypotension Neg Hx    Pseudochol deficiency Neg Hx    Malignant hyperthermia Neg Hx    Colon cancer Neg Hx    Pancreatic cancer Neg Hx    Esophageal cancer Neg Hx    Stomach cancer Neg Hx    Liver disease Neg Hx     Allergies  Allergen Reactions   Sulfa Antibiotics     Childhood reaction    Medication list has been reviewed and updated.  Current Outpatient Medications on File Prior to Visit  Medication Sig Dispense Refill   acetaminophen-codeine (TYLENOL #3) 300-30 MG tablet Take 1-2 tablets by mouth every 6 (six) hours as needed for moderate pain. 20 tablet 0   azelastine (ASTELIN) 0.1 % nasal spray Place 2 sprays into both nostrils 2 times daily. 30 mL 0   azithromycin (ZITHROMAX) 250 MG tablet Take 2 tablets today, then take 1 tablet daily until gone. 6 tablet 0   benzonatate (TESSALON) 100 MG capsule Take 1 capsule by mouth 2 times daily as needed for cough. 20 capsule 0   busPIRone (BUSPAR) 7.5 MG tablet Take 1 tablet (7.5 mg total) by mouth 2 (two) times daily.  After one week increase to 2 tablets (15 mg) twice a day 120 tablet  3   cyclobenzaprine (FLEXERIL) 10 MG tablet Take 1 tablet (10 mg total) by mouth 3 (three) times daily as needed for muscle spasms. 30 tablet 1   famotidine (PEPCID) 40 MG tablet Take 40 mg by mouth daily.     Semaglutide-Weight Management (WEGOVY) 0.5 MG/0.5ML SOAJ Inject 0.5 mg into the skin once a week. 2 mL 0   simvastatin (ZOCOR) 20 MG tablet TAKE 1 TABLET BY MOUTH AT BEDTIME 90 tablet 3   traZODone (DESYREL) 50 MG tablet Take 1/2 to 1 tablet (25-50 mg total) by mouth at bedtime as needed for sleep. 30 tablet 3   No current facility-administered medications on file prior to visit.    Review of Systems:  As per HPI- otherwise negative.   Physical Examination: There were no vitals filed for this visit. There were no vitals filed for this visit. There is no height or  weight on file to calculate BMI. Ideal Body Weight:    GEN: no acute distress. HEENT: Atraumatic, Normocephalic.  Ears and Nose: No external deformity. CV: RRR, No M/G/R. No JVD. No thrill. No extra heart sounds. PULM: CTA B, no wheezes, crackles, rhonchi. No retractions. No resp. distress. No accessory muscle use. ABD: S, NT, ND, +BS. No rebound. No HSM. EXTR: No c/c/e PSYCH: Normally interactive. Conversant.    Assessment and Plan: *** Patient seen today for physical exam.  Encouraged healthy diet and exercise routine Signed Abbe Amsterdam, MD

## 2022-11-30 ENCOUNTER — Ambulatory Visit (INDEPENDENT_AMBULATORY_CARE_PROVIDER_SITE_OTHER): Payer: Commercial Managed Care - PPO | Admitting: Family Medicine

## 2022-11-30 ENCOUNTER — Encounter: Payer: Self-pay | Admitting: Family Medicine

## 2022-11-30 ENCOUNTER — Other Ambulatory Visit (HOSPITAL_COMMUNITY): Payer: Self-pay

## 2022-11-30 VITALS — BP 124/78 | HR 86 | Temp 98.2°F | Resp 18 | Ht 61.0 in | Wt 188.8 lb

## 2022-11-30 DIAGNOSIS — H524 Presbyopia: Secondary | ICD-10-CM | POA: Diagnosis not present

## 2022-11-30 DIAGNOSIS — R5383 Other fatigue: Secondary | ICD-10-CM | POA: Diagnosis not present

## 2022-11-30 DIAGNOSIS — M722 Plantar fascial fibromatosis: Secondary | ICD-10-CM | POA: Diagnosis not present

## 2022-11-30 DIAGNOSIS — Z0000 Encounter for general adult medical examination without abnormal findings: Secondary | ICD-10-CM | POA: Diagnosis not present

## 2022-11-30 DIAGNOSIS — R7303 Prediabetes: Secondary | ICD-10-CM | POA: Diagnosis not present

## 2022-11-30 DIAGNOSIS — R52 Pain, unspecified: Secondary | ICD-10-CM

## 2022-11-30 DIAGNOSIS — E538 Deficiency of other specified B group vitamins: Secondary | ICD-10-CM | POA: Diagnosis not present

## 2022-11-30 DIAGNOSIS — E559 Vitamin D deficiency, unspecified: Secondary | ICD-10-CM

## 2022-11-30 DIAGNOSIS — E785 Hyperlipidemia, unspecified: Secondary | ICD-10-CM

## 2022-11-30 DIAGNOSIS — Z1329 Encounter for screening for other suspected endocrine disorder: Secondary | ICD-10-CM | POA: Diagnosis not present

## 2022-11-30 DIAGNOSIS — Z1231 Encounter for screening mammogram for malignant neoplasm of breast: Secondary | ICD-10-CM | POA: Diagnosis not present

## 2022-11-30 DIAGNOSIS — Z130 Encounter for screening for diseases of the blood and blood-forming organs and certain disorders involving the immune mechanism: Secondary | ICD-10-CM

## 2022-11-30 DIAGNOSIS — R87610 Atypical squamous cells of undetermined significance on cytologic smear of cervix (ASC-US): Secondary | ICD-10-CM | POA: Diagnosis not present

## 2022-11-30 DIAGNOSIS — H5201 Hypermetropia, right eye: Secondary | ICD-10-CM | POA: Diagnosis not present

## 2022-11-30 DIAGNOSIS — Z01419 Encounter for gynecological examination (general) (routine) without abnormal findings: Secondary | ICD-10-CM | POA: Diagnosis not present

## 2022-11-30 DIAGNOSIS — H52223 Regular astigmatism, bilateral: Secondary | ICD-10-CM | POA: Diagnosis not present

## 2022-11-30 LAB — COMPREHENSIVE METABOLIC PANEL
ALT: 17 U/L (ref 0–35)
AST: 20 U/L (ref 0–37)
Albumin: 4 g/dL (ref 3.5–5.2)
Alkaline Phosphatase: 81 U/L (ref 39–117)
BUN: 14 mg/dL (ref 6–23)
CO2: 25 mEq/L (ref 19–32)
Calcium: 9.1 mg/dL (ref 8.4–10.5)
Chloride: 102 mEq/L (ref 96–112)
Creatinine, Ser: 0.74 mg/dL (ref 0.40–1.20)
GFR: 97.63 mL/min (ref >60.00)
Glucose, Bld: 95 mg/dL (ref 70–99)
Potassium: 3.6 mEq/L (ref 3.5–5.1)
Sodium: 137 mEq/L (ref 135–145)
Total Bilirubin: 0.5 mg/dL (ref 0.2–1.2)
Total Protein: 6.8 g/dL (ref 6.0–8.3)

## 2022-11-30 LAB — LIPID PANEL
Cholesterol: 208 mg/dL — ABNORMAL HIGH (ref 0–200)
HDL: 49.9 mg/dL (ref >39.00)
NonHDL: 158.49
Total CHOL/HDL Ratio: 4
Triglycerides: 209 mg/dL — ABNORMAL HIGH (ref 0.0–149.0)
VLDL: 41.8 mg/dL — ABNORMAL HIGH (ref 0.0–40.0)

## 2022-11-30 LAB — CBC
HCT: 42.5 % (ref 36.0–46.0)
Hemoglobin: 14.5 g/dL (ref 12.0–15.0)
MCHC: 34.1 g/dL (ref 30.0–36.0)
MCV: 95.9 fl (ref 78.0–100.0)
Platelets: 305 10*3/uL (ref 150.0–400.0)
RBC: 4.43 Mil/uL (ref 3.87–5.11)
RDW: 13.1 % (ref 11.5–15.5)
WBC: 9.6 10*3/uL (ref 4.0–10.5)

## 2022-11-30 LAB — SEDIMENTATION RATE: Sed Rate: 13 mm/hr (ref 0–20)

## 2022-11-30 LAB — HM MAMMOGRAPHY

## 2022-11-30 LAB — C-REACTIVE PROTEIN: CRP: <1 mg/dL (ref 0.5–20.0)

## 2022-11-30 LAB — COLOGUARD: Cologuard: NEGATIVE

## 2022-11-30 LAB — TSH: TSH: 0.5 u[IU]/mL (ref 0.35–5.50)

## 2022-11-30 LAB — VITAMIN B12: Vitamin B-12: 125 pg/mL — ABNORMAL LOW (ref 211–911)

## 2022-11-30 LAB — VITAMIN D 25 HYDROXY (VIT D DEFICIENCY, FRACTURES): VITD: 20.81 ng/mL — ABNORMAL LOW (ref 30.00–100.00)

## 2022-11-30 LAB — HEMOGLOBIN A1C: Hgb A1c MFr Bld: 5.8 % (ref 4.6–6.5)

## 2022-11-30 LAB — LDL CHOLESTEROL, DIRECT: Direct LDL: 127 mg/dL

## 2022-11-30 MED ORDER — SIMVASTATIN 20 MG PO TABS
ORAL_TABLET | Freq: Every day | ORAL | 3 refills | Status: DC
  Filled 2022-11-30: qty 90, 90d supply, fill #0
  Filled 2023-01-25 – 2023-04-28 (×2): qty 90, 90d supply, fill #1
  Filled 2023-07-27: qty 90, 90d supply, fill #2

## 2022-11-30 MED ORDER — VITAMIN D3 1.25 MG (50000 UT) PO CAPS
ORAL_CAPSULE | ORAL | 0 refills | Status: DC
Start: 2022-11-30 — End: 2023-05-19
  Filled 2022-11-30: qty 12, 84d supply, fill #0

## 2022-11-30 NOTE — Addendum Note (Signed)
Addended by: Abbe Amsterdam C on: 11/30/2022 04:06 PM   Modules accepted: Orders

## 2022-12-01 LAB — RHEUMATOID FACTOR: Rheumatoid fact SerPl-aCnc: <10 IU/mL (ref <14)

## 2022-12-02 LAB — ANTI-NUCLEAR AB-TITER (ANA TITER): ANA Titer 1: 1:40 {titer} — ABNORMAL HIGH

## 2022-12-02 LAB — ANA: Anti Nuclear Antibody (ANA): POSITIVE — AB

## 2022-12-03 ENCOUNTER — Other Ambulatory Visit (HOSPITAL_COMMUNITY): Payer: Self-pay

## 2022-12-03 ENCOUNTER — Encounter: Payer: Self-pay | Admitting: Family Medicine

## 2022-12-03 DIAGNOSIS — R768 Other specified abnormal immunological findings in serum: Secondary | ICD-10-CM

## 2022-12-03 MED ORDER — DULOXETINE HCL 30 MG PO CPEP
30.0000 mg | ORAL_CAPSULE | Freq: Every day | ORAL | 3 refills | Status: DC
  Filled 2022-12-03: qty 60, 30d supply, fill #0
  Filled 2023-01-25: qty 60, 30d supply, fill #1
  Filled 2023-03-22: qty 60, 30d supply, fill #2
  Filled 2023-04-28: qty 60, 30d supply, fill #3

## 2022-12-03 NOTE — Addendum Note (Signed)
Addended by: Abbe Amsterdam C on: 12/03/2022 04:49 PM   Modules accepted: Orders

## 2022-12-03 NOTE — Addendum Note (Signed)
Addended by: Pearline Cables on: 12/03/2022 03:31 PM   Modules accepted: Orders

## 2022-12-07 ENCOUNTER — Encounter: Payer: Commercial Managed Care - PPO | Admitting: Family Medicine

## 2022-12-16 DIAGNOSIS — Z1212 Encounter for screening for malignant neoplasm of rectum: Secondary | ICD-10-CM | POA: Diagnosis not present

## 2022-12-16 DIAGNOSIS — Z1211 Encounter for screening for malignant neoplasm of colon: Secondary | ICD-10-CM | POA: Diagnosis not present

## 2022-12-16 LAB — COLOGUARD: Cologuard: NEGATIVE

## 2022-12-24 LAB — COLOGUARD: COLOGUARD: NEGATIVE

## 2022-12-30 ENCOUNTER — Ambulatory Visit: Payer: Commercial Managed Care - PPO | Admitting: Family Medicine

## 2022-12-30 ENCOUNTER — Encounter: Payer: Self-pay | Admitting: Family Medicine

## 2022-12-30 VITALS — BP 120/70 | Ht 61.0 in | Wt 188.0 lb

## 2022-12-30 DIAGNOSIS — M722 Plantar fascial fibromatosis: Secondary | ICD-10-CM

## 2022-12-30 NOTE — Patient Instructions (Signed)
You have plantar fasciitis Take tylenol and/or aleve as needed for pain  Do home exercises and stretches as directed. Ice heel for 15 minutes as needed. Avoid flat shoes/barefoot walking as much as possible. Arch straps have been shown to help with pain. Inserts are important - sports insoles with small scaphoid pads - can move in and out of different shoes. Steroid injection is a consideration for short term pain relief if you are struggling. [place] sock when you are sleeping. Shockwave therapy, physical therapy are also options. Follow up with me in 6 weeks.

## 2022-12-30 NOTE — Progress Notes (Signed)
Subjective:    Patient ID: Tricia [name], female    DOB: July 15, 1976, 47 y.o.   MRN: [id number]  HPI  Patient is a 47 year old female with a past medical history of fifth metacarpal fracture, prediabetes, ankle sprain, and plantar fasciitis of right foot in 2019 who presents today with bilateral heel/plantar surface foot pain that is been ongoing for the past year but acutely worse over the past 3 months.  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  Plantar pain bilaterally.  Left foot is worse than right.  Also noticed that pain is worse in the morning and her first step out of bed is painful.  So far she has done occasional icing and gentle range of motion exercises.  This has helped somewhat but pain is starting to become unbearable.  She denies taking any oral medications or using any topical creams.  Denies any traumatic events or recent injuries.  Denies any change in daily activities.  Wears Dansko shoes every day which initially provided some relief but is no longer working.  Patient's primary concern is that pain is now significantly impacting quality of life and ability to perform ADLs.  Review of Systems  Negative except as noted above    Objective:   Physical Exam Constitutional:      General: She is not in acute distress.    Appearance: Normal appearance.  Musculoskeletal:        General: Tenderness present. No swelling, deformity or signs of injury. Normal range of motion.     Comments: Examination of plantar surface of bilateral feet reveals no erythema, ecchymosis or generalized edema.  Palpation over medial aspect of calcaneus tender, left worse than right.  Patient has ROM largely WNL with mild restriction to ankle dorsiflexion consistent with calf tightness.  Windlass test positive on left foot.  Windlass test indeterminate on right foot.  Examined patient's foot mechanics upon standing which shows normal longitudinal arch with flattening of  transverse arch.  No signs of excessive foot pronation while walking.  Standing barefoot does reproduce pain over medial calcaneus.  Negative calcaneal squeeze test.  Skin:    General: Skin is warm and dry.     Capillary Refill: Capillary refill takes less than 2 seconds.     Findings: No bruising, erythema, lesion or rash.  Neurological:     General: No focal deficit present.     Mental Status: She is alert. Mental status is at baseline.     Sensory: No sensory deficit.     Motor: No weakness.     Coordination: Coordination normal.     Gait: Gait normal.  Psychiatric:        Behavior: Behavior normal.        Thought Content: Thought content normal.       Assessment & Plan:   1.  Bilateral plantar fasciitis, left worse than right History and physical exam consistent with bilateral plantar fasciitis Take tylenol and/or aleve as needed for pain; can also apply Voltaren up to 4 times daily as needed Do home exercises and stretches as directed. Ice heel for 15 minutes as needed. Avoid flat shoes/barefoot walking as much as possible. Arch straps have been shown to help with pain. Inserts are important - sports insoles with small scaphoid pads - can move in and out of different shoes. Steroid injection is a consideration for short term pain relief if you are struggling. [place] sock when  you are sleeping. Shockwave therapy, physical therapy are also options. Follow up with me in 6 weeks.

## 2023-01-25 ENCOUNTER — Encounter: Payer: Self-pay | Admitting: Family Medicine

## 2023-01-25 ENCOUNTER — Other Ambulatory Visit: Payer: Self-pay

## 2023-01-25 ENCOUNTER — Other Ambulatory Visit (HOSPITAL_COMMUNITY): Payer: Self-pay

## 2023-01-28 ENCOUNTER — Other Ambulatory Visit (HOSPITAL_COMMUNITY): Payer: Self-pay

## 2023-02-12 ENCOUNTER — Telehealth: Payer: Self-pay | Admitting: Family Medicine

## 2023-02-12 ENCOUNTER — Other Ambulatory Visit (INDEPENDENT_AMBULATORY_CARE_PROVIDER_SITE_OTHER): Payer: Commercial Managed Care - PPO

## 2023-02-12 DIAGNOSIS — R768 Other specified abnormal immunological findings in serum: Secondary | ICD-10-CM | POA: Diagnosis not present

## 2023-02-12 NOTE — Telephone Encounter (Signed)
Pt came in for appt and came by front desk to discuss billing issue. Pt had a CPE appt with Dr. Patsy Lager on 4.22.24 and stated that the labs that she got were not covered by insurance. After reviewing information in billing and chart, noted the following information:  -CPE visit was covered entirely  -New insurance - no need for 1y1d gap on CPE  [employer] was verified active for visit  -Pt did not talk outside of CPE regarding new issues  -Pt had additional labs other than CBC, CMP, and Lipid  -Pt got labs done same day of CPE  -Z00.00 diag. Code was used   Pt called insurance and they told her that a preventative CPT code needed to be put in (CPE code is already in). Pt also called billing and they stated for her to follow up with [place] on the issue with no additional info provided.

## 2023-02-14 LAB — ANTI-NUCLEAR AB-TITER (ANA TITER): ANA Titer 1: 1:40 {titer} — ABNORMAL HIGH

## 2023-02-14 LAB — ANA: Anti Nuclear Antibody (ANA): POSITIVE — AB

## 2023-02-15 ENCOUNTER — Encounter: Payer: Self-pay | Admitting: Family Medicine

## 2023-02-15 ENCOUNTER — Ambulatory Visit: Payer: Commercial Managed Care - PPO | Admitting: Family Medicine

## 2023-02-16 ENCOUNTER — Encounter: Payer: Self-pay | Admitting: Family Medicine

## 2023-02-18 ENCOUNTER — Encounter: Payer: Self-pay | Admitting: Family Medicine

## 2023-02-22 NOTE — Telephone Encounter (Signed)
Called pt and LMOVM explaining that we were able to change some codes regarding a bill she had received and hopefully this will take care of this and if she had any questions, she could call us back.

## 2023-03-02 ENCOUNTER — Encounter: Payer: Self-pay | Admitting: Family Medicine

## 2023-03-02 NOTE — Progress Notes (Unsigned)
[facility] [address] [place], [place] [address] [phone]  Date:  03/04/2023   Name:  PALMA BUSTER   DOB:  [birth year]   MRN:  [id number]  PCP:  Pearline Cables, MD    Chief Complaint: No chief complaint on file.   History of Present Illness:  Tricia [name] is a 47 y.o. very pleasant female patient who presents with the following:  Pt seen today with concern of anxiety- history of dyslipidemia, prediabetes, obesity, B12 deficiency  Last visit with myself was for her CPE in April  At that time she has stopped using Buspar as she felt like she was doing ok without it We then started cymbalta as she was having more sx of FBM She caught up with me again about 10 days ago: Yes I am still using the Cymbalta.  I have been having a lot of newer issues the last several weeks - maybe the past couple months.   I think I've just been having some self image issues, issues with my kids and school/life and some issues me and my husband are having.  We are working through them it's just been a little difficult lately.  I am happy to come in to see you if I need to in order to discuss if you feel that is needed.      Patient Active Problem List   Diagnosis Date Noted   Prediabetes 11/28/2022   B12 deficiency 11/28/2022   Sprain of ankle 09/08/2021   Near syncope 12/07/2018   Nonspecific abnormal electrocardiogram (ECG) (EKG) 12/07/2018   Closed fracture of base of fifth metacarpal bone of right hand with routine healing, subsequent encounter 12/06/2018   Plantar fasciitis of right foot 09/28/2017   GAD (generalized anxiety disorder) 05/19/2017    Past Medical History:  Diagnosis Date   Allergic rhinitis    Anxiety    History of chicken pox    History of shingles    Hyperlipidemia     Past Surgical History:  Procedure Laterality Date   CHOLECYSTECTOMY     HERNIA REPAIR     wisdome teeth      Social  History   Tobacco Use   Smoking status: Never    Passive exposure: Never   Smokeless tobacco: Never  Vaping Use   Vaping status: Never Used  Substance Use Topics   Alcohol use: Yes    Comment: occ   Drug use: No    Family History  Problem Relation Age of Onset   Hypertension Maternal Grandmother    Hypertension Maternal Grandfather    Hypertension Paternal Grandmother    Heart disease Paternal Grandmother    Hypertension Paternal Grandfather    Heart disease Paternal Grandfather    Anesthesia problems Neg Hx    Hypotension Neg Hx    Pseudochol deficiency Neg Hx    Malignant hyperthermia Neg Hx    Colon cancer Neg Hx    Pancreatic cancer Neg Hx    Esophageal cancer Neg Hx    Stomach cancer Neg Hx    Liver disease Neg Hx     Allergies  Allergen Reactions   Sulfa Antibiotics     Childhood reaction    Medication list has been reviewed and updated.  Current Outpatient Medications on File Prior to Visit  Medication Sig Dispense Refill   azelastine (ASTELIN) 0.1 % nasal spray Place 2 sprays into both nostrils 2  times daily. 30 mL 0   Cholecalciferol (VITAMIN D3) 1.25 MG (50000 UT) CAPS Take 1 capsule by mouth weekly for 12 weeks 12 capsule 0   cyclobenzaprine (FLEXERIL) 10 MG tablet Take 1 tablet (10 mg total) by mouth 3 (three) times daily as needed for muscle spasms. 30 tablet 1   DULoxetine (CYMBALTA) 30 MG capsule Take 1 capsule (30 mg total) by mouth daily. Increase to 60 mg after 1 week 60 capsule 3   famotidine (PEPCID) 40 MG tablet Take 40 mg by mouth daily.     simvastatin (ZOCOR) 20 MG tablet TAKE 1 TABLET BY MOUTH AT BEDTIME 90 tablet 3   No current facility-administered medications on file prior to visit.    Review of Systems:  As per HPI- otherwise negative.   Physical Examination: There were no vitals filed for this visit. There were no vitals filed for this visit. There is no height or weight on file to calculate BMI. Ideal Body Weight:    GEN:  no acute distress. HEENT: Atraumatic, Normocephalic.  Ears and Nose: No external deformity. CV: RRR, No M/G/R. No JVD. No thrill. No extra heart sounds. PULM: CTA B, no wheezes, crackles, rhonchi. No retractions. No resp. distress. No accessory muscle use. ABD: S, NT, ND, +BS. No rebound. No HSM. EXTR: No c/c/e PSYCH: Normally interactive. Conversant.    Assessment and Plan: ***  Signed Abbe Amsterdam, MD

## 2023-03-04 ENCOUNTER — Other Ambulatory Visit (HOSPITAL_COMMUNITY): Payer: Self-pay

## 2023-03-04 ENCOUNTER — Ambulatory Visit: Payer: Commercial Managed Care - PPO | Admitting: Family Medicine

## 2023-03-04 VITALS — BP 118/70 | HR 89 | Temp 97.8°F | Resp 18 | Ht 61.0 in | Wt 188.2 lb

## 2023-03-04 DIAGNOSIS — R52 Pain, unspecified: Secondary | ICD-10-CM | POA: Diagnosis not present

## 2023-03-04 DIAGNOSIS — F4010 Social phobia, unspecified: Secondary | ICD-10-CM | POA: Diagnosis not present

## 2023-03-04 MED ORDER — PROPRANOLOL HCL 10 MG PO TABS
10.0000 mg | ORAL_TABLET | Freq: Two times a day (BID) | ORAL | 1 refills | Status: DC
Start: 2023-03-04 — End: 2023-12-06
  Filled 2023-03-04: qty 60, 15d supply, fill #0
  Filled 2023-05-10: qty 60, 15d supply, fill #1

## 2023-03-04 NOTE — Patient Instructions (Addendum)
It was good to see you today- I am sorry you are having a hard time right now I think your idea of seeing a counselor is a good idea! Let me know if you guys think a medication change is indicated and we can make an adjustment In the meantime let's try adding propranolol 1 or 2 twice daily as needed for anxiety and social stress; take about an hour before stressful situation   Continue to use naproxen as needed for FBM pain

## 2023-03-17 ENCOUNTER — Ambulatory Visit: Payer: Commercial Managed Care - PPO | Admitting: Family Medicine

## 2023-03-21 IMAGING — CR DG CHEST 2V
2 series · 2 of 2 positions shown · non-contrast
Comparison: None.

CLINICAL DATA: chest pain 2 days ago

EXAM:
CHEST - 2 VIEW

[w chest pa]
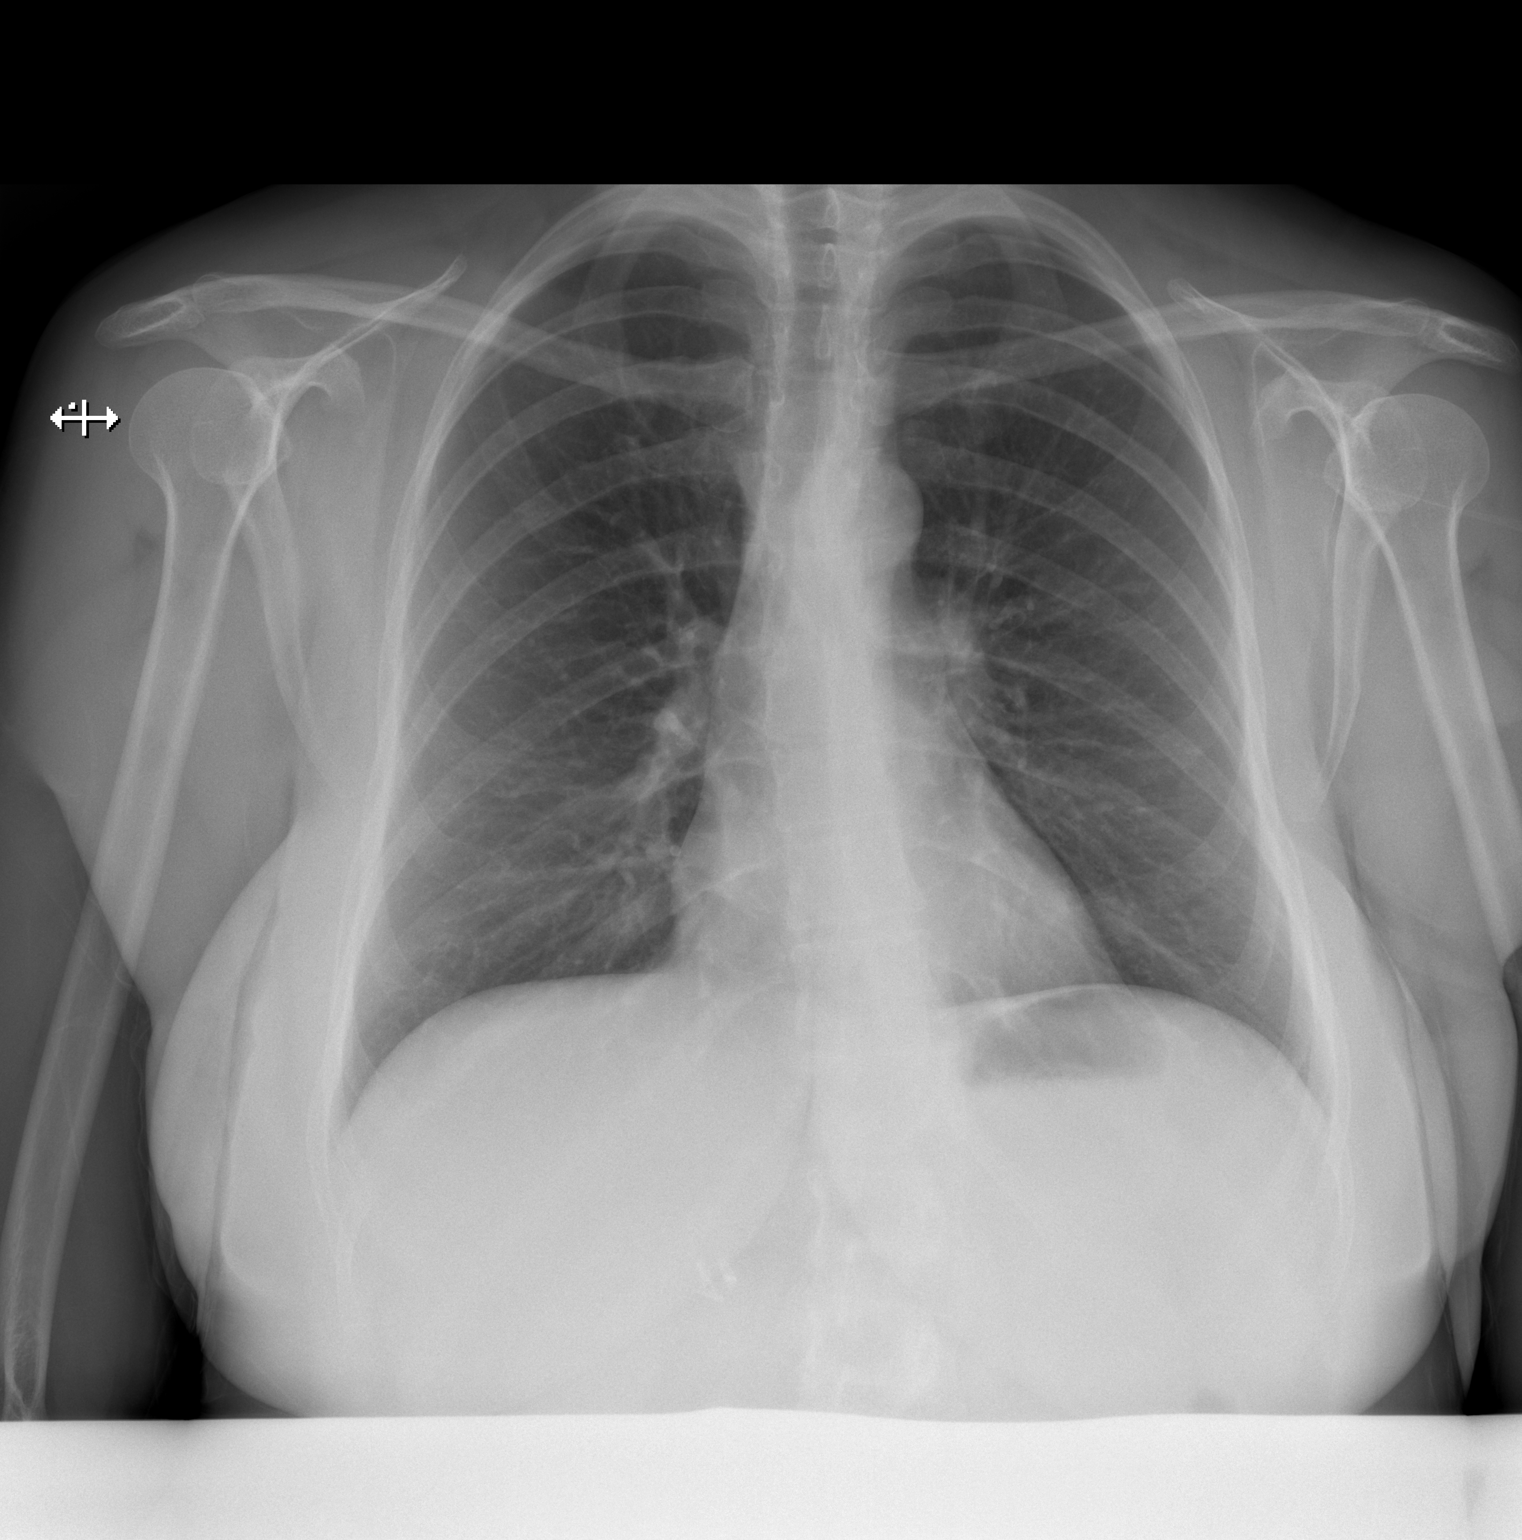

[w chest lat]
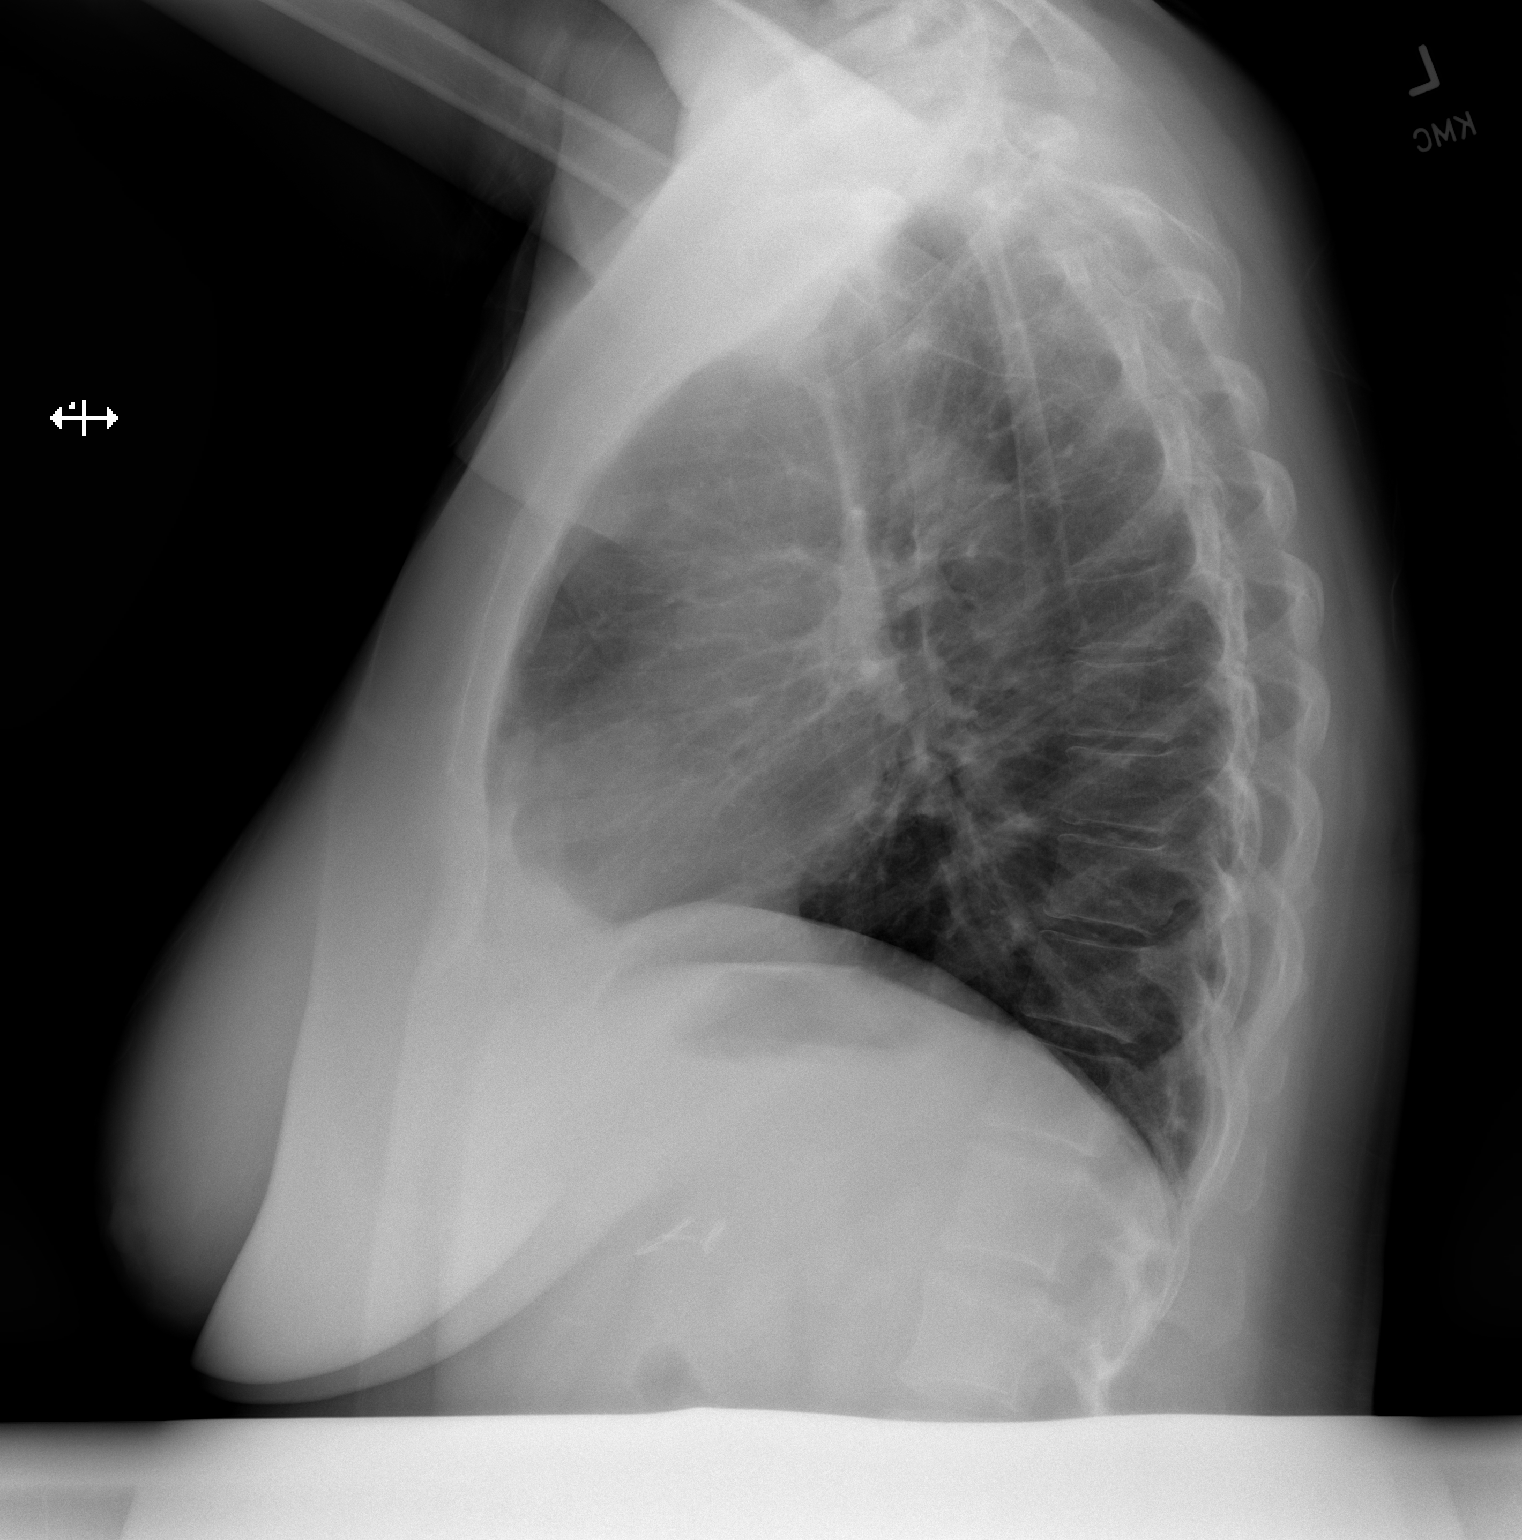

[2 of 2 positions shown; findings below may reference images not displayed]

FINDINGS: The heart size and mediastinal contours are within normal limits.
Both lungs are clear. The visualized skeletal structures are
unremarkable.
IMPRESSION: No evidence of acute cardiopulmonary disease.

## 2023-03-22 ENCOUNTER — Other Ambulatory Visit (HOSPITAL_COMMUNITY): Payer: Self-pay

## 2023-03-29 ENCOUNTER — Encounter: Payer: Self-pay | Admitting: Family Medicine

## 2023-04-29 ENCOUNTER — Other Ambulatory Visit (HOSPITAL_COMMUNITY): Payer: Self-pay

## 2023-05-10 ENCOUNTER — Other Ambulatory Visit: Payer: Self-pay

## 2023-05-10 ENCOUNTER — Other Ambulatory Visit (HOSPITAL_COMMUNITY): Payer: Self-pay

## 2023-05-10 MED ORDER — METHYLPREDNISOLONE 4 MG PO TBPK
ORAL_TABLET | ORAL | 1 refills | Status: DC
  Filled 2023-05-10: qty 21, 6d supply, fill #0

## 2023-05-11 ENCOUNTER — Other Ambulatory Visit (HOSPITAL_COMMUNITY): Payer: Self-pay

## 2023-05-16 NOTE — Patient Instructions (Incomplete)
It was good to see you again today!

## 2023-05-16 NOTE — Progress Notes (Unsigned)
[facility] [address] [place], [place] [address] [phone]  Date:  05/19/2023   Name:  Tricia [name]   DOB:  07-16-1976   MRN:  [id number]  PCP:  Pearline Cables, MD    Chief Complaint: Pain (Concerns/ questions: Fatigue, mental health Follow up)   History of Present Illness:  Tricia [name] is a 47 y.o. very pleasant female patient who presents with the following:  Patient seen today with a mental health concern Most recent visit with myself was in July at which time she was doing with anxiety Seen today with her mom who contributes to the history  At her visit in July, she noted stressors at home with her family and children, social anxiety and fear of looking foolish in front of others, poor self-confidence She was taking Cymbalta at that time, we actually started Cymbalta for her presumed fibromyalgia pain.  She planned to start seeing a counselor. I also gave her some propranolol for social anxiety She has been to see a counselor just 2-3 times so far - she plans to continue doing this, thinks it will help but they are just getting started  She is still taking Cymbalta and propranolol Pt notes she "hurts all over all the time"  She is feeling more tired- she does not feel refreshed in the am She did do a sleep test maybe 5 years ago which looked ok per her recollection  They note a lot of mood variability Pt's mother notes a strong family history of depression and anxiety We have tried several medications over the years including alprazolam, Wellbutrin, BuSpar, trazodone, venlafaxine She does feel that Cymbalta helps her as well as anything, it does seem to help with her pain She denies any suicidal ideation We have done some workup for autoimmune disorder, found only a minimally positive ANA.  Her mother would like Tricia [name] to see rheumatology.  I explained I can place a referral, but I cannot  guarantee she will be seen without laboratory evidence of an autoimmune disorder  Colonoscopy- pt notes she did a cologuard which was negative per DR Billy Coast  Flu shot patient declined COVID booster  She is taking 2 cymbalta daily- 60 mg total, and 1 propranolol twice a day  She has an IUD and does not get menses -she wonders if menopause could be part of her issue  Patient Active Problem List   Diagnosis Date Noted   Prediabetes 11/28/2022   B12 deficiency 11/28/2022   Sprain of ankle 09/08/2021   Near syncope 12/07/2018   Nonspecific abnormal electrocardiogram (ECG) (EKG) 12/07/2018   Closed fracture of base of fifth metacarpal bone of right hand with routine healing, subsequent encounter 12/06/2018   Plantar fasciitis of right foot 09/28/2017   GAD (generalized anxiety disorder) 05/19/2017    Past Medical History:  Diagnosis Date   Allergic rhinitis    Anxiety    History of chicken pox    History of shingles    Hyperlipidemia     Past Surgical History:  Procedure Laterality Date   CHOLECYSTECTOMY     HERNIA REPAIR     wisdome teeth      Social History   Tobacco Use   Smoking status: Never    Passive exposure: Never   Smokeless tobacco: Never  Vaping Use   Vaping status: Never Used  Substance Use Topics   Alcohol use: Yes  Comment: occ   Drug use: No    Family History  Problem Relation Age of Onset   Hypertension Maternal Grandmother    Hypertension Maternal Grandfather    Hypertension Paternal Grandmother    Heart disease Paternal Grandmother    Hypertension Paternal Grandfather    Heart disease Paternal Grandfather    Anesthesia problems Neg Hx    Hypotension Neg Hx    Pseudochol deficiency Neg Hx    Malignant hyperthermia Neg Hx    Colon cancer Neg Hx    Pancreatic cancer Neg Hx    Esophageal cancer Neg Hx    Stomach cancer Neg Hx    Liver disease Neg Hx     Allergies  Allergen Reactions   Sulfa Antibiotics     Childhood reaction     Medication list has been reviewed and updated.  Current Outpatient Medications on File Prior to Visit  Medication Sig Dispense Refill   cyclobenzaprine (FLEXERIL) 10 MG tablet Take 1 tablet (10 mg total) by mouth 3 (three) times daily as needed for muscle spasms. 30 tablet 1   DULoxetine (CYMBALTA) 30 MG capsule Take 1 capsule (30 mg total) by mouth daily. Increase to 60 mg after 1 week 60 capsule 3   famotidine (PEPCID) 40 MG tablet Take 40 mg by mouth daily.     levonorgestrel (MIRENA, 52 MG,) 20 MCG/DAY IUD Take by intrauterine route.     methylPREDNISolone (MEDROL DOSEPAK) 4 MG TBPK tablet Take as directed 21 tablet 1   propranolol (INDERAL) 10 MG tablet Take 1 - 2 tablets (10 - 20 mg total) by mouth 2 times daily. Use as needed for anxiety 60 tablet 1   simvastatin (ZOCOR) 20 MG tablet TAKE 1 TABLET BY MOUTH AT BEDTIME 90 tablet 3   No current facility-administered medications on file prior to visit.    Review of Systems:  As per HPI- otherwise negative.   Physical Examination: Vitals:   05/19/23 1542  BP: 110/72  Pulse: 88  Resp: 18  Temp: 98.3 F (36.8 C)  SpO2: 94%   Vitals:   05/19/23 1542  Weight: 189 lb 6.4 oz (85.9 kg)  Height: 5\' 1"  (1.549 m)   Body mass index is 35.79 kg/m. Ideal Body Weight: Weight in (lb) to have BMI = 25: 132  GEN: no acute distress.  Obese, looks well HEENT: Atraumatic, Normocephalic.  Ears and Nose: No external deformity. CV: RRR, No M/G/R. No JVD. No thrill. No extra heart sounds. PULM: CTA B, no wheezes, crackles, rhonchi. No retractions. No resp. distress. No accessory muscle use. EXTR: No c/c/e PSYCH: Normally interactive. Conversant.    Assessment and Plan: Body aches - Plan: Ambulatory referral to Rheumatology  Hot flashes - Plan: [facility]  Other fatigue - Plan: Ambulatory referral to Neurology  Social anxiety disorder  Patient seen today for follow-up and discussion of fatigue, mood swings and not feeling well We  decided to try increasing her Cymbalta to a total of 90 mg.  She will let me know how this works for her.  We also decided to come off propranolol as this does not seem to be helping and could contribute to fatigue I placed a referral to neurology for sleep study and also to rheumatology \\We  will check an [facility] and also a GeneSight test today Signed Abbe Amsterdam, MD

## 2023-05-19 ENCOUNTER — Ambulatory Visit: Payer: Commercial Managed Care - PPO | Admitting: Family Medicine

## 2023-05-19 ENCOUNTER — Telehealth: Payer: Self-pay

## 2023-05-19 VITALS — BP 110/72 | HR 88 | Temp 98.3°F | Resp 18 | Ht 61.0 in | Wt 189.4 lb

## 2023-05-19 DIAGNOSIS — R232 Flushing: Secondary | ICD-10-CM

## 2023-05-19 DIAGNOSIS — R52 Pain, unspecified: Secondary | ICD-10-CM | POA: Diagnosis not present

## 2023-05-19 DIAGNOSIS — F4010 Social phobia, unspecified: Secondary | ICD-10-CM

## 2023-05-19 DIAGNOSIS — R5383 Other fatigue: Secondary | ICD-10-CM | POA: Diagnosis not present

## 2023-05-19 NOTE — Telephone Encounter (Signed)
Pt was seen in office today. Genesight test was collected/ordered. Sample was packaged and placed up front for [employer] to pick up 4-5 pm tomorrow 05/20/23. Consent and insurance info were in the package.   Confirmation number for pickup is [id number] Order # [id number]

## 2023-05-20 ENCOUNTER — Encounter: Payer: Self-pay | Admitting: Family Medicine

## 2023-05-20 LAB — FOLLICLE STIMULATING HORMONE: FSH: 7.9 m[IU]/mL

## 2023-05-26 ENCOUNTER — Encounter: Payer: Self-pay | Admitting: Family Medicine

## 2023-05-31 MED ORDER — DESVENLAFAXINE SUCCINATE ER 50 MG PO TB24
50.0000 mg | ORAL_TABLET | Freq: Every day | ORAL | 3 refills | Status: DC
  Filled 2023-05-31: qty 30, 30d supply, fill #0
  Filled 2023-07-01: qty 30, 30d supply, fill #1
  Filled 2023-07-27: qty 30, 30d supply, fill #2
  Filled 2023-09-06: qty 30, 30d supply, fill #3

## 2023-06-01 ENCOUNTER — Other Ambulatory Visit (HOSPITAL_COMMUNITY): Payer: Self-pay

## 2023-06-01 ENCOUNTER — Other Ambulatory Visit: Payer: Self-pay

## 2023-06-04 ENCOUNTER — Other Ambulatory Visit (HOSPITAL_COMMUNITY): Payer: Self-pay

## 2023-06-04 ENCOUNTER — Encounter: Payer: Self-pay | Admitting: Family Medicine

## 2023-06-24 ENCOUNTER — Institutional Professional Consult (permissible substitution): Payer: Commercial Managed Care - PPO | Admitting: Neurology

## 2023-07-01 ENCOUNTER — Ambulatory Visit: Payer: Commercial Managed Care - PPO | Admitting: Neurology

## 2023-07-01 ENCOUNTER — Encounter: Payer: Self-pay | Admitting: Neurology

## 2023-07-01 ENCOUNTER — Encounter: Payer: Self-pay | Admitting: Family Medicine

## 2023-07-01 VITALS — BP 129/89 | HR 73 | Ht 61.0 in | Wt 182.6 lb

## 2023-07-01 DIAGNOSIS — R519 Headache, unspecified: Secondary | ICD-10-CM | POA: Diagnosis not present

## 2023-07-01 DIAGNOSIS — R0683 Snoring: Secondary | ICD-10-CM

## 2023-07-01 DIAGNOSIS — G478 Other sleep disorders: Secondary | ICD-10-CM | POA: Diagnosis not present

## 2023-07-01 DIAGNOSIS — G4719 Other hypersomnia: Secondary | ICD-10-CM

## 2023-07-01 DIAGNOSIS — F4010 Social phobia, unspecified: Secondary | ICD-10-CM

## 2023-07-01 DIAGNOSIS — R253 Fasciculation: Secondary | ICD-10-CM

## 2023-07-01 DIAGNOSIS — Z9189 Other specified personal risk factors, not elsewhere classified: Secondary | ICD-10-CM | POA: Diagnosis not present

## 2023-07-01 DIAGNOSIS — G4700 Insomnia, unspecified: Secondary | ICD-10-CM | POA: Diagnosis not present

## 2023-07-01 DIAGNOSIS — E538 Deficiency of other specified B group vitamins: Secondary | ICD-10-CM

## 2023-07-01 DIAGNOSIS — E66811 Obesity, class 1: Secondary | ICD-10-CM | POA: Diagnosis not present

## 2023-07-01 NOTE — Progress Notes (Signed)
Subjective:    Patient ID: Tricia [name] is a 47 y.o. female.  HPI    Huston Foley, MD, [name] [facility] [address], [address] [address] [place], [place] [address]  Dear Dr. Patsy Lager,   I saw your patient, Tricia [name], upon your kind request in my neurologic clinic today for initial consultation of her sleep disorder, in particular, concern for underlying obstructive sleep apnea. The patient is unaccompanied today. As you know, Tricia [name] is a 47 year old female with an underlying medical history of prediabetes, plantar fasciitis, allergic rhinitis, history of shingles, hyperlipidemia, and obesity, who reports snoring and excessive daytime somnolence, nonrestorative sleep and recurrent morning headaches as well as some difficulty initiating and maintaining sleep.  She has not had a recheck on her vitamin D level and vitamin B12 since April 2024.  She finished her vitamin D prescription.  She has never been on vitamin B12 injections.  Her Epworth sleepiness score is 12 out of 24.  She lives with her family including husband and 3 children.  She works full-time with [employer].  She quit smoking over 20 years ago.  She drinks alcohol about once a week.  She drinks caffeine in the form of soda, about 2 servings per day on average.  Bedtime varies, typically between 9 and 11 PM, rise time around 6:30 AM.  She denies nightly nocturia but has occasional to frequent morning headaches which are described as dull and achy, at the base of her head typically.  She has a TV in the bedroom but it is typically not on at night.  Snoring is disturbing to her husband.  She has not tried any over-the-counter medication to help her sleep at night.    I reviewed your office note from 05/19/2023.  She had extensive blood work through your office in April 2024 which I reviewed in her electronic chart.  Vitamin B12 was below normal at 125, A1c was in the prediabetes range at 5.8, cholesterol  panel showed mildly elevated cholesterol, and mildly elevated LDL.  TSH was normal at 0.5, vitamin D below normal at 20.8.  CRP and ESR were normal, ANA positive.  Patient was started on prescription vitamin D and advised to start oral B12 supplementation.  I had evaluated her for sleep apnea concern in the past.  She had a polysomnogram through our office on 08/29/2017 which showed no significant obstructive or central sleep disordered breathing with the exception of mild to moderate snoring.  She recently had a change in her medications about 3 weeks ago.  She is no longer on Inderal and Cymbalta, she started Pristiq.  She reports that she has movements and twitching while asleep as observed by her husband.  She has a lot of neck and upper shoulder muscle tension and occasionally takes Flexeril.  She is suspected to have fibromyalgia.  She is pending an appointment with rheumatology.   Previously:  07/13/2018: 47 year old right-handed woman with an underlying medical history of scoliosis, allergic rhinitis, history of shingles, hyperlipidemia, and obesity, who reports snoring and excessive daytime somnolence. She has had morning headaches. I reviewed your office note from 05/19/2017. Her Epworth sleepiness score is 14 out of 24, fatigue score is 39 out of 63. She lives at home with her husband and 3 children. 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 She is a nonsmoker and drinks alcohol occasionally, caffeine on a daily basis in the form of coffee, soda and tea, multiple servings per day,  including at night. Bedtime is generally between 10 and 11 PM, wakeup time between 6 and 6:30 AM. She does not wake up rested. She does have multiple nighttime awakenings for unclear reasons, denies any telltale symptoms of restless leg syndrome her leg twitching at night, she has been told in the past year that she has been snoring more consistently. She denies any night to night nocturia. She's not aware of any family  history of OSA except one of her grandmothers having a CPAP machine she recalls. She has had recurrent headaches, particularly in the occipital and posterior neck areas. This is not a throbbing headache such as migrainous headache, no associated photophobia or nausea or vomiting thankfully. Feels like a more dull achy nagging headache. She has also had shoulder muscle problems and has been seen by orthopedics in the past for this and her scoliosis. She has done physical therapy for this.  Her Past Medical History Is Significant For: Past Medical History:  Diagnosis Date  . Allergic rhinitis   . Anxiety   . History of chicken pox   . History of shingles   . Hyperlipidemia     Her Past Surgical History Is Significant For: Past Surgical History:  Procedure Laterality Date  . CHOLECYSTECTOMY    . HERNIA REPAIR    . wisdome teeth      Her Family History Is Significant For: Family History  Problem Relation Age of Onset  . Sleep apnea Father   . Hypertension Maternal Grandmother   . Hypertension Maternal Grandfather   . Hypertension Paternal Grandmother   . Heart disease Paternal Grandmother   . Hypertension Paternal Grandfather   . Heart disease Paternal Grandfather   . Anesthesia problems Neg Hx   . Hypotension Neg Hx   . Pseudochol deficiency Neg Hx   . Malignant hyperthermia Neg Hx   . Colon cancer Neg Hx   . Pancreatic cancer Neg Hx   . Esophageal cancer Neg Hx   . Stomach cancer Neg Hx   . Liver disease Neg Hx     Her Social History Is Significant For: Social History   Socioeconomic History  . Marital status: Married    Spouse name: Not on file  . Number of children: Not on file  . Years of education: Not on file  . Highest education level: Associate degree: academic program  Occupational History  . Not on file  Tobacco Use  . Smoking status: Never    Passive exposure: Never  . Smokeless tobacco: Never  Vaping Use  . Vaping status: Never Used  Substance and  Sexual Activity  . Alcohol use: Yes    Comment: occ  . Drug use: No  . Sexual activity: Yes    Birth control/protection: I.U.D.  Other Topics Concern  . Not on file  Social History Narrative  . Not on file   Social Determinants of Health   Financial Resource Strain: Low Risk  (05/19/2023)   Overall Financial Resource Strain (CARDIA)   . Difficulty of Paying Living Expenses: Not very hard  Food Insecurity: No Food Insecurity (05/19/2023)   Hunger Vital Sign   . Worried About Programme researcher, broadcasting/film/video in the Last Year: Never true   . Ran Out of Food in the Last Year: Never true  Transportation Needs: No Transportation Needs (05/19/2023)   PRAPARE - Transportation   . Lack of Transportation (Medical): No   . Lack of Transportation (Non-Medical): No  Physical Activity: Unknown (05/19/2023)  Exercise Vital Sign   . Days of Exercise per Week: 0 days   . Minutes of Exercise per Session: Not on file  Stress: Stress Concern Present (05/19/2023)   [employer] of Occupational Health - Occupational Stress Questionnaire   . Feeling of Stress : Rather much  Social Connections: Unknown (05/19/2023)   Social Connection and Isolation Panel [NHANES]   . Frequency of Communication with Friends and Family: Three times a week   . Frequency of Social Gatherings with Friends and Family: Once a week   . Attends Religious Services: Patient declined   . Active Member of Clubs or Organizations: Yes   . Attends Banker Meetings: More than 4 times per year   . Marital Status: Married    Her Allergies Are:  Allergies  Allergen Reactions  . Sulfa Antibiotics     Childhood reaction  :   Her Current Medications Are:  Outpatient Encounter Medications as of 07/01/2023  Medication Sig  . cyclobenzaprine (FLEXERIL) 10 MG tablet Take 1 tablet (10 mg total) by mouth 3 (three) times daily as needed for muscle spasms.  Marland Kitchen desvenlafaxine (PRISTIQ) 50 MG 24 hr tablet Take 1 tablet (50 mg total) by  mouth daily.  . famotidine (PEPCID) 40 MG tablet Take 40 mg by mouth daily.  Marland Kitchen levonorgestrel (MIRENA, 52 MG,) 20 MCG/DAY IUD Take by intrauterine route.  . simvastatin (ZOCOR) 20 MG tablet TAKE 1 TABLET BY MOUTH AT BEDTIME  . methylPREDNISolone (MEDROL DOSEPAK) 4 MG TBPK tablet Take as directed  . propranolol (INDERAL) 10 MG tablet Take 1 - 2 tablets (10 - 20 mg total) by mouth 2 times daily. Use as needed for anxiety   No facility-administered encounter medications on file as of 07/01/2023.  :   Review of Systems:  Out of a complete 14 point review of systems, all are reviewed and negative with the exception of these symptoms as listed below:   Review of Systems  Neurological:        Pt here for sleep consult Pt snores,headaches Pt denies cpap machine,hypertension Pt states sleep study 5 years ago   ESS FSS     Objective:  Neurological Exam  Physical Exam Physical Examination:   Vitals:   07/01/23 0837  BP: 129/89  Pulse: 73    General Examination: The patient is a very pleasant 47 y.o. female in no acute distress. She appears well-developed and well-nourished and well groomed.   HEENT: Normocephalic, atraumatic, pupils are equal, round and reactive to light, extraocular tracking is good without limitation to gaze excursion or nystagmus noted. Hearing is grossly intact. Face is symmetric with normal facial animation. Speech is clear with no dysarthria noted. There is no hypophonia. There is no lip, neck/head, jaw or voice tremor. Neck is supple with full range of passive and active motion. There are no carotid bruits on auscultation. Oropharynx exam reveals: mild mouth dryness, good dental hygiene and mild airway crowding, due to tonsillar size of about 2+ bilaterally.  Mallampati class II.  Tongue protrudes centrally and palate elevates symmetrically.  Neck circumference 14 inches.  Mild overbite.   Chest: Clear to auscultation without wheezing, rhonchi or crackles  noted.  Heart: S1+S2+0, regular and normal without murmurs, rubs or gallops noted.   Abdomen: Soft, non-tender and non-distended.  Extremities: There is no pitting edema in the distal lower extremities bilaterally.   Skin: Warm and dry without trophic changes noted.   Musculoskeletal: exam reveals no obvious joint deformities.  Neurologically:  Mental status: The patient is awake, alert and oriented in all 4 spheres. Her immediate and remote memory, attention, language skills and fund of knowledge are appropriate. There is no evidence of aphasia, agnosia, apraxia or anomia. Speech is clear with normal prosody and enunciation. Thought process is linear. Mood is normal and affect is normal.  Cranial nerves II - XII are as described above under HEENT exam.  Motor exam: Normal bulk, strength and tone is noted. There is no obvious action or resting tremor.  Fine motor skills and coordination: grossly intact.  Cerebellar testing: No dysmetria or intention tremor. There is no truncal or gait ataxia.  Sensory exam: intact to light touch in the upper and lower extremities.  Gait, station and balance: She stands easily. No veering to one side is noted. No leaning to one side is noted. Posture is age-appropriate and stance is narrow based. Gait shows normal stride length and normal pace. No problems turning are noted.   Assessment and Plan:  In summary, JOHANNAH BOLHUIS is a very pleasant 47 y.o.-year old female with an underlying medical history of prediabetes, plantar fasciitis, allergic rhinitis, history of shingles, hyperlipidemia, and obesity, who presents for evaluation of her sleep disturbance including snoring, twitching at night, difficulty initiating and maintaining sleep, nonrestorative sleep and daytime somnolence.  She has recurrent morning headaches. This was an extended visit of over 60 minutes, with copious record review involved, addressing multiple problems, extensive counseling and  coordination of care. We discussed treatment options of obstructive sleep apnea.  We discussed that she could try melatonin at night for sleep starting with 2 or 3 mg about an hour before bedtime.  She is encouraged to circle back to your office to see if she can have her vitamin D and B12 levels rechecked and consider B12 injections and restarting prescription vitamin D if levels are low or low normal still.  These deficiencies may tie in with her complaint of fatigue and nonrestorative sleep.   She is encouraged to keep her appointment with rheumatology.   We will proceed with a sleep study to reevaluate her sleep and to look into the possibility of underlying sleep disordered breathing.  If she has obstructive sleep apnea, we will consider treatment, primarily in the form of a positive airway pressure device such as AutoPap.  We talked about alternative treatment options to CPAP or AutoPap therapy as well.  These options may include an oral appliance, weight loss, or surgical options including an implantable hypoglossal nerve stimulator.  We will plan a follow-up according to her sleep test results.  We will keep her posted as to the status of the authorization and scheduling for sleep testing by phone call and/or MyChart message.  I answered all her questions today and she was given detailed instructions in her MyChart after visit summary as well.  She was in agreement with our plan.  Thank you very much for allowing me to participate in the care of this nice patient. If I can be of any further assistance to you please do not hesitate to call me at [phone].  Sincerely,   Huston Foley, MD, [name]

## 2023-07-01 NOTE — Patient Instructions (Signed)
It was nice to see you again today!   Here is what we discussed today:   Recheck your vit D and B12 levels; consider B12 injections and restarting Rx vitamin D, if still low or low normal values.  You can try Melatonin at night for sleep: take 2 mg or 3 mg, one to 2 hours before your bedtime. You can go up to 5 mg if needed, even 10 mg eventually. It is over the counter and comes in pill form, chewable form and spray, if you prefer.   Based on your symptoms and your exam I believe you are at risk for obstructive sleep apnea (aka OSA). We should proceed with a sleep study to determine whether you do or do not have OSA and how severe it is. Even, if you have mild OSA, I may want you to consider treatment with CPAP, as treatment of even borderline or mild sleep apnea can result and improvement of symptoms such as sleep disruption, daytime sleepiness, nighttime bathroom breaks, restless leg symptoms, improvement of headache syndromes, even improved mood disorder.   As explained, an attended sleep study (meaning you get to stay overnight in the sleep lab), lets [place] monitor sleep-related behaviors such as sleep talking and leg movements in sleep, in addition to monitoring for sleep apnea.  A home sleep test is a screening tool for sleep apnea diagnosis only, but unfortunately, does not help with any other sleep-related diagnoses.  Please remember, the long-term risks and ramifications of untreated moderate to severe obstructive sleep apnea may include (but are not limited to): increased risk for cardiovascular disease, including congestive heart failure, stroke, difficult to control hypertension, treatment resistant obesity, arrhythmias, especially irregular heartbeat commonly known as A. Fib. (atrial fibrillation); even type 2 diabetes has been linked to untreated OSA.   Other correlations that untreated obstructive sleep apnea include macular edema which is swelling of the retina in the eyes, droopy eyelid  syndrome, and elevated hemoglobin and hematocrit levels (often referred to as polycythemia).  Sleep apnea can cause disruption of sleep and sleep deprivation in most cases, which, in turn, can cause recurrent headaches, problems with memory, mood, concentration, focus, and vigilance. Most people with untreated sleep apnea report excessive daytime sleepiness, which can affect their ability to drive. Please do not drive or use heavy equipment or machinery, if you feel sleepy! Patients with sleep apnea can also develop difficulty initiating and maintaining sleep (aka insomnia).   Having sleep apnea may increase your risk for other sleep disorders, including involuntary behaviors sleep such as sleep terrors, sleep talking, sleepwalking.    Having sleep apnea can also increase your risk for restless leg syndrome and leg movements at night.   Please note that untreated obstructive sleep apnea may carry additional perioperative morbidity. Patients with significant obstructive sleep apnea (typically, in the moderate to severe degree) should receive, if possible, perioperative PAP (positive airway pressure) therapy and the surgeons and particularly the anesthesiologists should be informed of the diagnosis and the severity of the sleep disordered breathing.   We will call you or email you through MyChart with regards to your test results and plan a follow-up in sleep clinic accordingly. Most likely, you will hear from one of our nurses.   Our sleep lab administrative assistant will call you to schedule your sleep study and give you further instructions, regarding the check in process for the sleep study, arrival time, what to bring, when you can expect to leave after the study, etc.,  and to answer any other logistical questions you may have. If you don't hear back from her by about 2 weeks from now, please feel free to call her direct line at [phone] or you can call our general clinic number, or email [place]  through My Chart.

## 2023-07-02 ENCOUNTER — Other Ambulatory Visit (HOSPITAL_COMMUNITY): Payer: Self-pay

## 2023-07-02 MED ORDER — HYDROXYZINE PAMOATE 25 MG PO CAPS
25.0000 mg | ORAL_CAPSULE | Freq: Three times a day (TID) | ORAL | 0 refills | Status: DC | PRN
  Filled 2023-07-02: qty 30, 10d supply, fill #0

## 2023-07-02 NOTE — Addendum Note (Signed)
Addended by: Abbe Amsterdam C on: 07/02/2023 10:00 AM   Modules accepted: Orders

## 2023-07-05 ENCOUNTER — Other Ambulatory Visit (HOSPITAL_COMMUNITY): Payer: Self-pay

## 2023-07-29 ENCOUNTER — Ambulatory Visit: Payer: Commercial Managed Care - PPO | Admitting: Neurology

## 2023-07-29 DIAGNOSIS — R0683 Snoring: Secondary | ICD-10-CM

## 2023-07-29 DIAGNOSIS — E66811 Obesity, class 1: Secondary | ICD-10-CM

## 2023-07-29 DIAGNOSIS — G4700 Insomnia, unspecified: Secondary | ICD-10-CM

## 2023-07-29 DIAGNOSIS — Z9189 Other specified personal risk factors, not elsewhere classified: Secondary | ICD-10-CM

## 2023-07-29 DIAGNOSIS — G4719 Other hypersomnia: Secondary | ICD-10-CM

## 2023-07-29 DIAGNOSIS — R253 Fasciculation: Secondary | ICD-10-CM

## 2023-07-29 DIAGNOSIS — R519 Headache, unspecified: Secondary | ICD-10-CM

## 2023-07-29 DIAGNOSIS — G4733 Obstructive sleep apnea (adult) (pediatric): Secondary | ICD-10-CM | POA: Diagnosis not present

## 2023-07-29 DIAGNOSIS — G478 Other sleep disorders: Secondary | ICD-10-CM

## 2023-08-02 ENCOUNTER — Other Ambulatory Visit (HOSPITAL_COMMUNITY): Payer: Self-pay

## 2023-09-14 ENCOUNTER — Encounter: Payer: Self-pay | Admitting: Family Medicine

## 2023-09-27 ENCOUNTER — Ambulatory Visit: Payer: Commercial Managed Care - PPO | Admitting: Family Medicine

## 2023-09-27 ENCOUNTER — Other Ambulatory Visit: Payer: Self-pay

## 2023-09-27 VITALS — BP 124/82 | Ht 61.0 in | Wt 185.0 lb

## 2023-09-27 DIAGNOSIS — G5603 Carpal tunnel syndrome, bilateral upper limbs: Secondary | ICD-10-CM

## 2023-09-27 MED ORDER — METHYLPREDNISOLONE ACETATE 40 MG/ML IJ SUSP
40.0000 mg | Freq: Once | INTRAMUSCULAR | Status: AC
  Administered 2023-09-27: 40 mg via INTRA_ARTICULAR

## 2023-09-27 NOTE — Progress Notes (Unsigned)
 PCP: Copland, Gwenlyn Found, MD  SUBJECTIVE:   HPI:  Patient is a 48 y.o. female RHD here with chief complaint of bilateral hand numbness and tingling in 1st-3rd digits in bilateral hands L>R. No improvement with wrist splints. In the past has been less frequent, now bothering her when she is on the phone. It also wakes her from sleep. She can have occasional elbow pain, denies any neck pain. She has never had carpal tunnel injections in the past.  Pertinent ROS were reviewed with the patient and found to be negative unless otherwise specified above in HPI.   PERTINENT  PMH / PSH / FH / SH:  Past Medical, Surgical, Social, and Family History Reviewed & Updated in the EMR.  Pertinent findings include:    Past Medical History:  Diagnosis Date   Allergic rhinitis    Anxiety    History of chicken pox    History of shingles    Hyperlipidemia     Current Outpatient Medications on File Prior to Visit  Medication Sig Dispense Refill   cyclobenzaprine (FLEXERIL) 10 MG tablet Take 1 tablet (10 mg total) by mouth 3 (three) times daily as needed for muscle spasms. 30 tablet 1   desvenlafaxine (PRISTIQ) 50 MG 24 hr tablet Take 1 tablet (50 mg total) by mouth daily. 30 tablet 3   famotidine (PEPCID) 40 MG tablet Take 40 mg by mouth daily.     hydrOXYzine (VISTARIL) 25 MG capsule Take 1 capsule (25 mg total) by mouth every 8 (eight) hours as needed. 30 capsule 0   levonorgestrel (MIRENA, 52 MG,) 20 MCG/DAY IUD Take by intrauterine route.     methylPREDNISolone (MEDROL DOSEPAK) 4 MG TBPK tablet Take as directed 21 tablet 1   propranolol (INDERAL) 10 MG tablet Take 1 - 2 tablets (10 - 20 mg total) by mouth 2 times daily. Use as needed for anxiety 60 tablet 1   simvastatin (ZOCOR) 20 MG tablet TAKE 1 TABLET BY MOUTH AT BEDTIME 90 tablet 3   No current facility-administered medications on file prior to visit.    Past Surgical History:  Procedure Laterality Date   CHOLECYSTECTOMY     HERNIA REPAIR      wisdome teeth      Allergies  Allergen Reactions   Sulfa Antibiotics     Childhood reaction    OBJECTIVE:  BP 124/82   Ht 5\' 1"  (1.549 m)   Wt 185 lb (83.9 kg)   BMI 34.96 kg/m   MSK EXAM:  Wrist, bilateral: Inspection yielded no erythema, ecchymosis, bony deformity, or swelling. ROM full with good flexion and extension and ulnar/radial deviation that is symmetrical with opposite wrist. Palpation is normal over metacarpals, scaphoid, lunate, and TFCC; tendons without tenderness/swelling. Strength 5./5 wrist extension and flexion bilaterally, 4/5 AIN on left hand, 5/5 right hand. Provocative testing demonstrates negative Finkelstein's bilaterally. Positive Phalen's, compression testing, and Tinel's test on the left hand, negative on the right. Spurling negative.  Limited MSK u/s:  Left median nerve volume 0.16cm2, right median nerve volume 0.17cm2. Assessment & Plan Bilateral carpal tunnel syndrome Chronic tingling 1st-3rd digits left greater than right unresponsive to splints. On exam of the left hand she has slight thenar atrophy and decreased strength of AIN. Ultrasound diagnostic for CTS bilaterally. Given ultrasound findings are c/w CTS we discussed trial of median nerve CSI hydrodissection on the left side today. If no improvement will order EMG, if improvement can consider injection for the right side. Recommend continued  use of splints.    After informed written consent timeout was performed, patient was seated on exam table.  Area overlying left carpal tunnel prepped with alcohol swabs then utilizing ultrasound guidance, patient's left carpal tunnel injected with 2:1 lidocaine: depomedrol with hydrodissection of median nerve from the flexor retinaculum.  Patient tolerated procedure well without immediate complications.   Micah Noel MD PGY2 [facility]

## 2023-09-28 ENCOUNTER — Encounter: Payer: Self-pay | Admitting: Family Medicine

## 2023-09-30 ENCOUNTER — Ambulatory Visit (INDEPENDENT_AMBULATORY_CARE_PROVIDER_SITE_OTHER): Payer: Commercial Managed Care - PPO

## 2023-09-30 ENCOUNTER — Other Ambulatory Visit (HOSPITAL_COMMUNITY)
Admission: RE | Admit: 2023-09-30 | Discharge: 2023-09-30 | Disposition: A | Discharge status: Home / Self Care | Payer: Commercial Managed Care - PPO | Source: Ambulatory Visit | Attending: Obstetrics and Gynecology | Admitting: Obstetrics and Gynecology

## 2023-09-30 DIAGNOSIS — N898 Other specified noninflammatory disorders of vagina: Secondary | ICD-10-CM

## 2023-09-30 NOTE — Progress Notes (Signed)
 SUBJECTIVE:  48 y.o. female complains of vaginal odor X 1 week. Denies abnormal vaginal bleeding or significant pelvic pain or fever. No UTI symptoms. Denies history of known exposure to STD.  No LMP recorded. (Menstrual status: IUD).  OBJECTIVE:  She appears well, afebrile. Urine dipstick: not done.  ASSESSMENT:  Vaginal Odor   PLAN:  GC, chlamydia, trichomonas, BVAG, CVAG probe sent to lab. Treatment: To be determined once lab results are received ROV prn if symptoms persist or worsen.

## 2023-10-01 LAB — CERVICOVAGINAL ANCILLARY ONLY
Bacterial Vaginitis (gardnerella): POSITIVE — AB
Candida Glabrata: NEGATIVE
Candida Vaginitis: NEGATIVE
Chlamydia: NEGATIVE
Comment: NEGATIVE
Comment: NEGATIVE
Comment: NEGATIVE
Comment: NEGATIVE
Comment: NEGATIVE
Comment: NORMAL
Neisseria Gonorrhea: NEGATIVE
Trichomonas: NEGATIVE

## 2023-10-04 ENCOUNTER — Other Ambulatory Visit (HOSPITAL_COMMUNITY): Payer: Self-pay

## 2023-10-04 ENCOUNTER — Encounter: Payer: Self-pay | Admitting: Obstetrics and Gynecology

## 2023-10-04 MED ORDER — METRONIDAZOLE 500 MG PO TABS
500.0000 mg | ORAL_TABLET | Freq: Two times a day (BID) | ORAL | 0 refills | Status: DC
  Filled 2023-10-04: qty 14, 7d supply, fill #0

## 2023-10-04 NOTE — Addendum Note (Signed)
 Addended by: Catalina Antigua on: 10/04/2023 09:46 AM   Modules accepted: Orders

## 2023-10-05 ENCOUNTER — Other Ambulatory Visit (HOSPITAL_COMMUNITY): Payer: Self-pay

## 2023-10-05 ENCOUNTER — Other Ambulatory Visit: Payer: Self-pay

## 2023-10-05 MED ORDER — METRONIDAZOLE 0.75 % VA GEL
1.0000 | Freq: Every day | VAGINAL | 0 refills | Status: DC
  Filled 2023-10-05 (×3): qty 70, 7d supply, fill #0

## 2023-10-07 ENCOUNTER — Encounter: Payer: Self-pay | Admitting: Family Medicine

## 2023-10-07 DIAGNOSIS — F4010 Social phobia, unspecified: Secondary | ICD-10-CM

## 2023-10-08 ENCOUNTER — Other Ambulatory Visit (HOSPITAL_COMMUNITY): Payer: Self-pay

## 2023-10-08 MED ORDER — SERTRALINE HCL 50 MG PO TABS
50.0000 mg | ORAL_TABLET | Freq: Every day | ORAL | 3 refills | Status: DC
  Filled 2023-10-08: qty 30, 30d supply, fill #0
  Filled 2023-11-05: qty 30, 30d supply, fill #1
  Filled 2023-12-03: qty 30, 30d supply, fill #2

## 2023-10-08 NOTE — Addendum Note (Signed)
 Addended by: Pearline Cables on: 10/08/2023 03:48 PM   Modules accepted: Orders

## 2023-10-12 NOTE — Progress Notes (Signed)
 Office Visit Note  Patient: Tricia [name]             Date of Birth: [birth year]           MRN: [id number]             PCP: Pearline Cables, MD Referring: Pearline Cables, MD Visit Date: 10/26/2023 Occupation: @GUAROCC @  Subjective:  fatigue and pain  History of Present Illness: Tricia [name] is a 48 y.o. female seen for the evaluation of fatigue and positive ANA.  According the patient her symptoms started in her 30s but the fatigue and myalgias.  She states her symptoms have been gradually getting worse over the last few years.  She states these episodes of increased fatigue happens about once or twice a week.  She also has generalized pain mostly localized to her shoulders, elbows, buttock region, knees and recently in her left first MTP joint.  She had 2 episodes of COVID-19 virus infection in the past.  She believes that the symptoms got worse after that.  She also gives history of scoliosis and some chronic back discomfort.  She states she feels tired even after sleeping all night.  She had a sleep study recently which was unremarkable per patient.  There is no history of oral ulcers, nasal ulcers, sicca symptoms, malar rash, photosensitivity, Raynaud's or lymphadenopathy.  There is no history of inflammatory arthritis.  She exercises 3-5 times per week on a treadmill for about 30 minutes.  She is gravida 4, para 3, miscarriage 1.  There is no history of preeclampsia or DVTs.  She uses IUD for contraception.  She is right-handed.  She drinks alcohol socially and is non-smoker.  There is no family history of autoimmune disease.    Activities of Daily Living:  Patient reports morning stiffness for 0 minute.   Patient Reports nocturnal pain.  Difficulty dressing/grooming: Denies Difficulty climbing stairs: Denies Difficulty getting out of chair: Denies Difficulty using hands for taps, buttons, cutlery, and/or writing: Denies  Review of Systems  Constitutional:  Positive for  fatigue.  HENT:  Negative for mouth dryness.   Eyes:  Negative for dryness.  Respiratory:  Negative for shortness of breath.   Cardiovascular:  Negative for chest pain and palpitations.  Gastrointestinal:  Positive for constipation and diarrhea. Negative for blood in stool.  Endocrine: Negative for increased urination.  Genitourinary:  Positive for involuntary urination.  Musculoskeletal:  Positive for joint pain, joint pain, myalgias, muscle weakness, muscle tenderness and myalgias. Negative for gait problem, joint swelling and morning stiffness.  Skin:  Negative for color change, rash, hair loss and sensitivity to sunlight.  Allergic/Immunologic: Negative for susceptible to infections.  Neurological:  Positive for dizziness and headaches.  Hematological:  Negative for swollen glands.  Psychiatric/Behavioral:  Positive for depressed mood and sleep disturbance. The patient is nervous/anxious.     PMFS History:  Patient Active Problem List   Diagnosis Date Noted   Prediabetes 11/28/2022   B12 deficiency 11/28/2022   Sprain of ankle 09/08/2021   Near syncope 12/07/2018   Nonspecific abnormal electrocardiogram (ECG) (EKG) 12/07/2018   Closed fracture of base of fifth metacarpal bone of right hand with routine healing, subsequent encounter 12/06/2018   Plantar fasciitis of right foot 09/28/2017   GAD (generalized anxiety disorder) 05/19/2017    Past Medical History:  Diagnosis Date   Allergic rhinitis    Anxiety    Depression    History of chicken pox  History of shingles    Hyperlipidemia     Family History  Problem Relation Age of Onset   Sleep apnea Father    Hypertension Maternal Grandmother    Hypertension Maternal Grandfather    Hypertension Paternal Grandmother    Heart disease Paternal Grandmother    Hypertension Paternal Grandfather    Heart disease Paternal Grandfather    Anesthesia problems Neg Hx    Hypotension Neg Hx    Pseudochol deficiency Neg Hx     Malignant hyperthermia Neg Hx    Colon cancer Neg Hx    Pancreatic cancer Neg Hx    Esophageal cancer Neg Hx    Stomach cancer Neg Hx    Liver disease Neg Hx    Past Surgical History:  Procedure Laterality Date   CHOLECYSTECTOMY     HERNIA REPAIR     wisdome teeth     Social History   Social History Narrative   Not on file   Immunization History  Administered Date(s) Administered   Influenza-Unspecified 06/07/2019, 05/10/2021   PFIZER Comirnaty(Gray Top)Covid-19 Tri-Sucrose Vaccine 04/10/2020, 04/24/2020   Td 05/19/2017   Tdap 08/10/2006     Objective: Vital Signs: BP 116/82 (BP Location: Right Arm, Patient Position: Sitting, Cuff Size: Large)   Pulse 73   Resp 14   Ht 5\' 3"  (1.6 m)   Wt 177 lb (80.3 kg)   Breastfeeding No   BMI 31.35 kg/m    Physical Exam Vitals and nursing note reviewed.  Constitutional:      Appearance: She is well-developed.  HENT:     Head: Normocephalic and atraumatic.  Eyes:     Conjunctiva/sclera: Conjunctivae normal.  Cardiovascular:     Rate and Rhythm: Normal rate and regular rhythm.     Heart sounds: Normal heart sounds.  Pulmonary:     Effort: Pulmonary effort is normal.     Breath sounds: Normal breath sounds.  Abdominal:     General: Bowel sounds are normal.     Palpations: Abdomen is soft.  Musculoskeletal:     Cervical back: Normal range of motion.  Lymphadenopathy:     Cervical: No cervical adenopathy.  Skin:    General: Skin is warm and dry.     Capillary Refill: Capillary refill takes less than 2 seconds.  Neurological:     Mental Status: She is alert and oriented to person, place, and time.  Psychiatric:        Behavior: Behavior normal.      Musculoskeletal Exam: Patient had good range of motion of the cervical spine.  She had bilateral trapezius spasm.  She had no discomfort range of motion of the thoracic or lumbar spine.  She had mild levoscoliosis.  Shoulder joints, elbow joints, wrist joints, MCPs PIPs and  DIPs with good range of motion with no synovitis.  She has some tenderness over bilateral lateral epicondyle region.  Hip joints were in good range of motion.  She had tenderness over bilateral trochanteric bursa.  She had tenderness over the medial aspect of the knees.  No warmth swelling or effusion was noted.  There was no tenderness over ankles or MTPs.  She had no difficulty getting up from the squatting position.  CDAI Exam: CDAI Score: -- Patient Global: --; Provider Global: -- Swollen: --; Tender: -- Joint Exam 10/26/2023   No joint exam has been documented for this visit   There is currently no information documented on the homunculus. Go to the Rheumatology activity and complete  the homunculus joint exam.  Investigation: No additional findings.  Imaging: No results found.  Recent Labs: Lab Results  Component Value Date   WBC 9.6 11/30/2022   HGB 14.5 11/30/2022   PLT 305.0 11/30/2022   NA 137 11/30/2022   K 3.6 11/30/2022   CL 102 11/30/2022   CO2 25 11/30/2022   GLUCOSE 95 11/30/2022   BUN 14 11/30/2022   CREATININE 0.74 11/30/2022   BILITOT 0.5 11/30/2022   ALKPHOS 81 11/30/2022   AST 20 11/30/2022   ALT 17 11/30/2022   PROT 6.8 11/30/2022   ALBUMIN 4.0 11/30/2022   CALCIUM 9.1 11/30/2022   GFRAA >60 11/21/2018    Speciality Comments: No specialty comments available.  Procedures:  No procedures performed Allergies: Sulfa antibiotics   Assessment / Plan:     Visit Diagnoses: Positive ANA (antinuclear antibody) - 11/30/22 ANA 1:40NH, RF-, CRP<1, ESR 13, Vitamin B12 125, vitamin D 20.81, TSH 0.50. 02/12/23: ANA 1:40nuclear, dense fine speckled -patient has low titer ANA.  She gives history of intermittent sores in her mouth and intermittent dry mouth.  She also gives history of arthralgias and fatigue.  There is no history of malar rash, photosensitivity, Raynaud's, lymphadenopathy or inflammatory arthritis.  Plan: ANA, Anti-scleroderma antibody, RNP Antibody,  Anti-Smith antibody, Sjogrens syndrome-A extractable nuclear antibody, Sjogrens syndrome-B extractable nuclear antibody, Anti-DNA antibody, double-stranded, C3 and C4  Body aches -she complains of muscle aches and generalized bodyaches.  No muscular weakness was noted.  She could get up from the squatting position without any difficulty.  Plan: CK  Closed fracture of base of fifth metacarpal bone of right hand with routine healing, subsequent encounter - 2020.  Patient had needs no routine history or problems.  Polyarthralgia -she complains of pain and discomfort in multiple joints.  She complains of discomfort in her bilateral shoulders.  She had good range of motion of bilateral shoulders.  She also complains of discomfort in her elbows and her hips.  Plan: Cyclic citrul peptide antibody, IgG  Trapezius muscle spasm-she had bilateral trapezius muscle spasm and tenderness.  A handout on exercises was given.  Trochanteric bursitis of both hips- she had tenderness on palpation of bilateral trochanteric bursa, more prominent on the right side.  A handout on IT band stretches was given.  Myofascial pain-she continues to have generalized pain and discomfort and fatigue.  She also had multiple tender points.  Association of the symptoms with myofascial pain and fibromyalgia was discussed.  Benefits of  water aerobics, swimming, stretching were also discussed at length.  Good sleep hygiene was discussed.  I offered referral to integrative therapies for physical therapy which she declined.  Fatigue-she can history of chronic fatigue.  She states despite sleeping through the night she feels tired.  Plantar fasciitis of right foot-she has intermittent plantar fasciitis.  Proper fitting shoes were advised.  Dyslipidemia-she is on simvastatin.  Prediabetes  Irritable bowel syndrome with both constipation and diarrhea-Association of IBS with fibromyalgia was also discussed.  Gastroesophageal reflux  disease without esophagitis-she is on Pepcid.  B12 deficiency  Vitamin D deficiency-her vitamin D was low in the past.  She has been taking vitamin D.  Patient states she will get repeat vitamin D level done next month.  GAD (generalized anxiety disorder)-she is on Zoloft.    Orders: Orders Placed This Encounter  Procedures   ANA   Anti-scleroderma antibody   RNP Antibody   Anti-Smith antibody   Sjogrens syndrome-A extractable nuclear antibody   Sjogrens  syndrome-B extractable nuclear antibody   Anti-DNA antibody, double-stranded   C3 and C4   CK   Cyclic citrul peptide antibody, IgG   No orders of the defined types were placed in this encounter.    Follow-Up Instructions: Return for +ANA, fatigue.   Pollyann Savoy, MD  Note - This record has been created using Animal nutritionist.  Chart creation errors have been sought, but may not always  have been located. Such creation errors do not reflect on  the standard of medical care.

## 2023-10-25 ENCOUNTER — Ambulatory Visit: Payer: Commercial Managed Care - PPO | Admitting: Family Medicine

## 2023-10-26 ENCOUNTER — Encounter: Payer: Self-pay | Admitting: Rheumatology

## 2023-10-26 ENCOUNTER — Ambulatory Visit: Payer: Commercial Managed Care - PPO | Attending: Rheumatology | Admitting: Rheumatology

## 2023-10-26 VITALS — BP 116/82 | HR 73 | Resp 14 | Ht 63.0 in | Wt 177.0 lb

## 2023-10-26 DIAGNOSIS — S62316D Displaced fracture of base of fifth metacarpal bone, right hand, subsequent encounter for fracture with routine healing: Secondary | ICD-10-CM

## 2023-10-26 DIAGNOSIS — E538 Deficiency of other specified B group vitamins: Secondary | ICD-10-CM

## 2023-10-26 DIAGNOSIS — K219 Gastro-esophageal reflux disease without esophagitis: Secondary | ICD-10-CM

## 2023-10-26 DIAGNOSIS — E785 Hyperlipidemia, unspecified: Secondary | ICD-10-CM | POA: Diagnosis not present

## 2023-10-26 DIAGNOSIS — R768 Other specified abnormal immunological findings in serum: Secondary | ICD-10-CM

## 2023-10-26 DIAGNOSIS — M2550 Pain in unspecified joint: Secondary | ICD-10-CM

## 2023-10-26 DIAGNOSIS — F411 Generalized anxiety disorder: Secondary | ICD-10-CM

## 2023-10-26 DIAGNOSIS — M7062 Trochanteric bursitis, left hip: Secondary | ICD-10-CM

## 2023-10-26 DIAGNOSIS — K582 Mixed irritable bowel syndrome: Secondary | ICD-10-CM | POA: Diagnosis not present

## 2023-10-26 DIAGNOSIS — M722 Plantar fascial fibromatosis: Secondary | ICD-10-CM

## 2023-10-26 DIAGNOSIS — E559 Vitamin D deficiency, unspecified: Secondary | ICD-10-CM

## 2023-10-26 DIAGNOSIS — R52 Pain, unspecified: Secondary | ICD-10-CM

## 2023-10-26 DIAGNOSIS — R55 Syncope and collapse: Secondary | ICD-10-CM

## 2023-10-26 DIAGNOSIS — R9431 Abnormal electrocardiogram [ECG] [EKG]: Secondary | ICD-10-CM

## 2023-10-26 DIAGNOSIS — R7303 Prediabetes: Secondary | ICD-10-CM

## 2023-10-26 DIAGNOSIS — M7061 Trochanteric bursitis, right hip: Secondary | ICD-10-CM

## 2023-10-26 DIAGNOSIS — M7918 Myalgia, other site: Secondary | ICD-10-CM

## 2023-10-26 DIAGNOSIS — R5383 Other fatigue: Secondary | ICD-10-CM

## 2023-10-26 DIAGNOSIS — M62838 Other muscle spasm: Secondary | ICD-10-CM

## 2023-10-26 NOTE — Patient Instructions (Addendum)
 Cervical Strain and Sprain Rehab Ask your health care provider which exercises are safe for you. Do exercises exactly as told by your health care provider and adjust them as directed. It is normal to feel mild stretching, pulling, tightness, or discomfort as you do these exercises. Stop right away if you feel sudden pain or your pain gets worse. Do not begin these exercises until told by your health care provider. Stretching and range-of-motion exercises Cervical side bending  Using good posture, sit on a stable chair or stand up. Without moving your shoulders, slowly tilt your left / right ear to your shoulder until you feel a stretch in the neck muscles on the opposite side. You should be looking straight ahead. Hold for __________ seconds. Repeat with the other side of your neck. Repeat __________ times. Complete this exercise __________ times a day. Cervical rotation  Using good posture, sit on a stable chair or stand up. Slowly turn your head to the side as if you are looking over your left / right shoulder. Keep your eyes level with the ground. Stop when you feel a stretch along the side and the back of your neck. Hold for __________ seconds. Repeat this by turning to your other side. Repeat __________ times. Complete this exercise __________ times a day. Thoracic extension and pectoral stretch  Roll a towel or a small blanket so it is about 4 inches (10 cm) in diameter. Lie down on your back on a firm surface. Put the towel in the middle of your back across your spine. It should not be under your shoulder blades. Put your hands behind your head and let your elbows fall out to your sides. Hold for __________ seconds. Repeat __________ times. Complete this exercise __________ times a day. Strengthening exercises Upper cervical flexion  Lie on your back with a thin pillow behind your head or a small, rolled-up towel under your neck. Gently tuck your chin toward your chest and nod  your head down to look toward your feet. Do not lift your head off the pillow. Hold for __________ seconds. Release the tension slowly. Relax your neck muscles completely before you repeat this exercise. Repeat __________ times. Complete this exercise __________ times a day. Cervical extension  Stand about 6 inches (15 cm) away from a wall, with your back facing the wall. Place a soft object, about 6-8 inches (15-20 cm) in diameter, between the back of your head and the wall. A soft object could be a small pillow, a ball, or a folded towel. Gently tilt your head back and press into the soft object. Keep your jaw and forehead relaxed. Hold for __________ seconds. Release the tension slowly. Relax your neck muscles completely before you repeat this exercise. Repeat __________ times. Complete this exercise __________ times a day. Posture and body mechanics Body mechanics refer to the movements and positions of your body while you do your daily activities. Posture is part of body mechanics. Good posture and healthy body mechanics can help to relieve stress in your body's tissues and joints. Good posture means that your spine is in its natural S-curve position (your spine is neutral), your shoulders are pulled back slightly, and your head is not tipped forward. The following are general guidelines for using improved posture and body mechanics in your everyday activities. Sitting  When sitting, keep your spine neutral and keep your feet flat on the floor. Use a footrest, if needed, and keep your thighs parallel to the floor. Avoid rounding  your shoulders. Avoid tilting your head forward. When working at a desk or a computer, keep your desk at a height where your hands are slightly lower than your elbows. Slide your chair under your desk so you are close enough to maintain good posture. When working at a computer, place your monitor at a height where you are looking straight ahead and you do not have to  tilt your head forward or downward to look at the screen. Standing  When standing, keep your spine neutral and keep your feet about hip-width apart. Keep a slight bend in your knees. Your ears, shoulders, and hips should line up. When you do a task in which you stand in one place for a long time, place one foot up on a stable object that is 2-4 inches (5-10 cm) high, such as a footstool. This helps keep your spine neutral. Resting When lying down and resting, avoid positions that are most painful for you. Try to support your neck in a neutral position. You can use a contour pillow or a small rolled-up towel. Your pillow should support your neck but not push on it. This information is not intended to replace advice given to you by your health care provider. Make sure you discuss any questions you have with your health care provider. Document Revised: 11/30/2022 Document Reviewed: 02/16/2022 Elsevier Patient Education  2024 [employer] Iliotibial Band Syndrome Rehab Ask your health care provider which exercises are safe for you. Do exercises exactly as told by your provider and adjust them as told. It's normal to feel mild stretching, pulling, tightness, or discomfort as you do these exercises. Stop right away if you feel sudden pain or your pain gets a lot worse. Do not begin these exercises until told by your provider. Stretching and range-of-motion exercises These exercises warm up your muscles and joints. They also improve the movement and flexibility of your hip and pelvis. Quadriceps stretch, prone  Lie face down (prone) on a firm surface like a bed or padded floor. Bend your left / right knee. Reach back to hold your ankle or pant leg. If you can't reach your ankle or pant leg, use a belt looped around your foot and grab the belt instead. Gently pull your heel toward your butt. Your knee should not slide out to the side. You should feel a stretch in the front of your thigh and knee, also  called the quadriceps. Hold this position for __________ seconds. Repeat __________ times. Complete this exercise __________ times a day. Iliotibial band stretch The iliotibial band is a strip of tissue that runs along the outside of your hip down to your knee. Lie on your side with your left / right leg on top. Bend both knees and grab your left / right ankle. Stretch out your bottom arm to help you balance. Slowly bring your top knee back so your thigh goes behind your back. Slowly lower your top leg toward the floor until you feel a gentle stretch on the outside of your left / right hip and thigh. If you don't feel a stretch and your knee won't go farther, place the heel of your other foot on top of your knee and pull your knee down toward the floor with your foot. Hold this position for __________ seconds. Repeat __________ times. Complete this exercise __________ times a day. Strengthening exercises These exercises build strength and endurance in your hip and pelvis. Endurance means your muscles can keep working even when they're  tired. Straight leg raises, side-lying This exercise strengthens the muscles that rotate the leg at the hip and move it away from your body. These muscles are called hip abductors. Lie on your side with your left / right leg on top. Lie so your head, shoulder, hip, and knee line up. You can bend your bottom knee to help you balance. Roll your hips slightly forward so they're stacked directly over each other. Your left / right knee should face forward. Tense the muscles in your outer thigh and hip. Lift your top leg 4-6 inches (10-15 cm) off the ground. Hold this position for __________ seconds. Slowly lower your leg back down to the starting position. Let your muscles fully relax before doing this exercise again. Repeat __________ times. Complete this exercise __________ times a day. Leg raises, prone This exercise strengthens the muscles that move the hips  backward. These muscles are called hip extensors. Lie face down (prone) on your bed or a firm surface. You can put a pillow under your hips for comfort and to support your lower back. Bend your left / right knee so your foot points straight up toward the ceiling. Keep the other leg straight and behind you. Squeeze your butt muscles. Lift your left / right thigh off the firm surface. Do not let your back arch. Tense your thigh muscle as hard as you can without having more knee pain. Hold this position for __________ seconds. Slowly lower your leg to the starting position. Allow your leg to relax all the way. Repeat __________ times. Complete this exercise __________ times a day. Hip hike  Stand sideways on a bottom step. Place your feet so that your left / right leg is on the step, and the other foot is hanging off the side. If you need support for balance, hold onto a railing or wall. Keep your knees straight and your abdomen square, meaning your hips are level. Then, lift your left / right hip up toward the ceiling. Slowly let your leg that's hanging off the step lower towards the floor. Your foot should get closer to the ground. Do not lean or bend your knees during this movement. Repeat __________ times. Complete this exercise __________ times a day. This information is not intended to replace advice given to you by your health care provider. Make sure you discuss any questions you have with your health care provider. Document Revised: 10/09/2022 Document Reviewed: 10/09/2022 Elsevier Patient Education  2024 [employer].

## 2023-10-29 LAB — SJOGRENS SYNDROME-A EXTRACTABLE NUCLEAR ANTIBODY: SSA (Ro) (ENA) Antibody, IgG: <1 AI

## 2023-10-29 LAB — C3 AND C4
C3 Complement: 154 mg/dL (ref 83–193)
C4 Complement: 28 mg/dL (ref 15–57)

## 2023-10-29 LAB — CYCLIC CITRUL PEPTIDE ANTIBODY, IGG: Cyclic Citrullin Peptide Ab: <16 U

## 2023-10-29 LAB — ANTI-SMITH ANTIBODY: ENA SM Ab Ser-aCnc: <1 AI

## 2023-10-29 LAB — ANA: Anti Nuclear Antibody (ANA): POSITIVE — AB

## 2023-10-29 LAB — ANTI-NUCLEAR AB-TITER (ANA TITER): ANA Titer 1: 1:40 {titer} — ABNORMAL HIGH

## 2023-10-29 LAB — ANTI-DNA ANTIBODY, DOUBLE-STRANDED: ds DNA Ab: 1 [IU]/mL

## 2023-10-29 LAB — ANTI-SCLERODERMA ANTIBODY: Scleroderma (Scl-70) (ENA) Antibody, IgG: <1 AI

## 2023-10-29 LAB — CK: Total CK: 65 U/L (ref 20–239)

## 2023-10-29 LAB — SJOGRENS SYNDROME-B EXTRACTABLE NUCLEAR ANTIBODY: SSB (La) (ENA) Antibody, IgG: <1 AI

## 2023-10-29 LAB — RNP ANTIBODY: Ribonucleic Protein(ENA) Antibody, IgG: <1 AI

## 2023-10-29 NOTE — Progress Notes (Signed)
 Office Visit Note  Patient: Tricia [name]             Date of Birth: 1976/03/03           MRN: [id number]             PCP: Pearline Cables, MD Referring: Pearline Cables, MD Visit Date: 11/12/2023 Occupation: @GUAROCC @  Subjective:  Pain in multiple joints and fatigue  History of Present Illness: Tricia [name] is a 48 y.o. female with history of fatigue, positive ANA returns for a follow-up visit after her initial evaluation on October 26, 2023.  Patient states that she continues to have pain and discomfort in all of her joints and muscles.  She also has significant fatigue.  She gives history of dry mouth and dry eyes.  There is no history of joint inflammation.  She complains of discomfort in bilateral trapezius region and also trochanteric bursa region.    Activities of Daily Living:  Patient reports morning stiffness for 0 minutes.   Patient Reports nocturnal pain.  Difficulty dressing/grooming: Denies Difficulty climbing stairs: Denies Difficulty getting out of chair: Denies Difficulty using hands for taps, buttons, cutlery, and/or writing: Denies  Review of Systems  Constitutional:  Positive for fatigue.  HENT:  Positive for mouth dryness. Negative for mouth sores and nose dryness.   Eyes:  Positive for dryness. Negative for pain.  Respiratory:  Negative for shortness of breath and difficulty breathing.   Cardiovascular:  Negative for chest pain and palpitations.  Gastrointestinal:  Positive for constipation and diarrhea. Negative for blood in stool.  Endocrine: Negative for increased urination.  Genitourinary:  Negative for involuntary urination.  Musculoskeletal:  Positive for joint pain, joint pain, myalgias, muscle weakness, muscle tenderness and myalgias. Negative for gait problem, joint swelling and morning stiffness.  Skin:  Negative for color change, rash, hair loss and sensitivity to sunlight.  Allergic/Immunologic: Negative for susceptible to  infections.  Neurological:  Positive for headaches. Negative for dizziness.  Hematological:  Negative for swollen glands.  Psychiatric/Behavioral:  Positive for depressed mood. Negative for sleep disturbance. The patient is nervous/anxious.     PMFS History:  Patient Active Problem List   Diagnosis Date Noted   Prediabetes 11/28/2022   B12 deficiency 11/28/2022   Sprain of ankle 09/08/2021   Near syncope 12/07/2018   Nonspecific abnormal electrocardiogram (ECG) (EKG) 12/07/2018   Closed fracture of base of fifth metacarpal bone of right hand with routine healing, subsequent encounter 12/06/2018   Plantar fasciitis of right foot 09/28/2017   GAD (generalized anxiety disorder) 05/19/2017    Past Medical History:  Diagnosis Date   Allergic rhinitis    Anxiety    Depression    History of chicken pox    History of shingles    Hyperlipidemia     Family History  Problem Relation Age of Onset   Sleep apnea Father    Heart disease Father    Atrial fibrillation Father    Colitis Brother    Heart disease Brother    Hypertension Maternal Grandmother    Hypertension Maternal Grandfather    Hypertension Paternal Grandmother    Heart disease Paternal Grandmother    Hypertension Paternal Grandfather    Heart disease Paternal Grandfather    ADD / ADHD Son    Asthma Son    Anesthesia problems Neg Hx    Hypotension Neg Hx    Pseudochol deficiency Neg Hx    Malignant hyperthermia Neg Hx  Colon cancer Neg Hx    Pancreatic cancer Neg Hx    Esophageal cancer Neg Hx    Stomach cancer Neg Hx    Liver disease Neg Hx    Past Surgical History:  Procedure Laterality Date   CHOLECYSTECTOMY     HERNIA REPAIR     wisdome teeth     Social History   Social History Narrative   Not on file   Immunization History  Administered Date(s) Administered   Influenza-Unspecified 06/07/2019, 05/10/2021   PFIZER Comirnaty(Gray Top)Covid-19 Tri-Sucrose Vaccine 04/10/2020, 04/24/2020   Td  05/19/2017   Tdap 08/10/2006     Objective: Vital Signs: BP 114/78 (BP Location: Left Arm, Patient Position: Sitting, Cuff Size: Normal)   Pulse 70   Resp 15   Ht 5\' 2"  (1.575 m)   Wt 176 lb 9.6 oz (80.1 kg)   BMI 32.30 kg/m    Physical Exam Vitals and nursing note reviewed.  Constitutional:      Appearance: She is well-developed.  HENT:     Head: Normocephalic and atraumatic.  Eyes:     Conjunctiva/sclera: Conjunctivae normal.  Cardiovascular:     Rate and Rhythm: Normal rate and regular rhythm.     Heart sounds: Normal heart sounds.  Pulmonary:     Effort: Pulmonary effort is normal.     Breath sounds: Normal breath sounds.  Abdominal:     General: Bowel sounds are normal.     Palpations: Abdomen is soft.  Musculoskeletal:     Cervical back: Normal range of motion.  Lymphadenopathy:     Cervical: No cervical adenopathy.  Skin:    General: Skin is warm and dry.     Capillary Refill: Capillary refill takes less than 2 seconds.  Neurological:     Mental Status: She is alert and oriented to person, place, and time.  Psychiatric:        Behavior: Behavior normal.      Musculoskeletal Exam: Cervical, thoracic and lumbar spine were in good range of motion.  She had bilateral trapezius spasm.  Shoulder joints, elbow joints, wrist joints, MCPs PIPs and DIPs with good range of motion with no synovitis.  She had tenderness over bilateral trochanteric bursa and also medial aspect of her knees.  Hip joints, knee joints, ankles, MTPs and PIPs with good range of motion with no synovitis.  CDAI Exam: CDAI Score: -- Patient Global: --; Provider Global: -- Swollen: --; Tender: -- Joint Exam 11/12/2023   No joint exam has been documented for this visit   There is currently no information documented on the homunculus. Go to the Rheumatology activity and complete the homunculus joint exam.  Investigation: No additional findings.  Imaging: No results found.  Recent  Labs: Lab Results  Component Value Date   WBC 9.6 11/30/2022   HGB 14.5 11/30/2022   PLT 305.0 11/30/2022   NA 137 11/30/2022   K 3.6 11/30/2022   CL 102 11/30/2022   CO2 25 11/30/2022   GLUCOSE 95 11/30/2022   BUN 14 11/30/2022   CREATININE 0.74 11/30/2022   BILITOT 0.5 11/30/2022   ALKPHOS 81 11/30/2022   AST 20 11/30/2022   ALT 17 11/30/2022   PROT 6.8 11/30/2022   ALBUMIN 4.0 11/30/2022   CALCIUM 9.1 11/30/2022   GFRAA >60 11/21/2018   October 26, 2023 C3-C4 normal, ANA 1: 40, CK 65, anti-CCP 16 October 26, 2023 ENA (dsDNA, SSA, SSB, Smith, RNP, SCL 70) negative,  Speciality Comments: No specialty comments available.  Procedures:  No procedures performed Allergies: Sulfa antibiotics   Assessment / Plan:     Visit Diagnoses: Positive ANA (antinuclear antibody) - ANA low titer positive, not significant, history of intermittent oral ulcers, dry mouth, arthralgias, fatigue.  Labs obtained on October 26, 2023 were reviewed ANA was 1: 40 which was low titer.  ENA panel was negative, complements were normal.  No further workup is needed at this point.  Advised her to contact me if she develops any new symptoms.  Body aches - Generalized body aches and muscle aches.  CK was normal.  I believe her symptoms are coming from underlying fibromyalgia.  Closed fracture of base of fifth metacarpal bone of right hand with routine healing, subsequent encounter - 2020.  Polyarthralgia -she complains of pain in shoulders, elbows, hips.  No synovitis was noted.  Need for regular exercise and stretching was discussed.  Plantar fasciitis of right foot - Intermittent.  Improved after she got orthotics.  Trochanteric bursitis of both hips-she had tender Raynauds over bilateral trochanteric region.  IT band stretches were demonstrated and a handout was given.  Natural anti-inflammatories were discussed.  Trapezius muscle spasm-she has bilateral trapezius spasm and discomfort.  She states the pain  goes into her shoulders.  I discussed the option of trigger point injections in the future if her symptoms get worse.  Lidocaine patches were advised.  A handout on neck exercises was also given.  Fibromyalgia-she has some generalized pain, hyperalgesia, positive tender points.  Detailed counseling on fibromyalgia syndrome was provided.  Benefits of swimming, water aerobics and stretching were discussed.  I will also refer her to integrative therapies.  Other fatigue-related to fibromyalgia.  Other medical problems are listed as follows:  Prediabetes  Dyslipidemia  Irritable bowel syndrome with both constipation and diarrhea  Gastroesophageal reflux disease without esophagitis  Vitamin D deficiency  B12 deficiency  GAD (generalized anxiety disorder)  Orders: Orders Placed This Encounter  Procedures   Ambulatory referral to Physical Therapy   No orders of the defined types were placed in this encounter.   Follow-Up Instructions: Return if symptoms worsen or fail to improve, for MFPS, Osteoarthritis.   Pollyann Savoy, MD  Note - This record has been created using Animal nutritionist.  Chart creation errors have been sought, but may not always  have been located. Such creation errors do not reflect on  the standard of medical care.

## 2023-10-31 NOTE — Progress Notes (Signed)
 All the lab results are within normal limits.  ANA is low titer positive and not significant.

## 2023-11-12 ENCOUNTER — Ambulatory Visit: Attending: Rheumatology | Admitting: Rheumatology

## 2023-11-12 ENCOUNTER — Encounter: Payer: Self-pay | Admitting: Rheumatology

## 2023-11-12 VITALS — BP 114/78 | HR 70 | Resp 15 | Ht 62.0 in | Wt 176.6 lb

## 2023-11-12 DIAGNOSIS — S62316D Displaced fracture of base of fifth metacarpal bone, right hand, subsequent encounter for fracture with routine healing: Secondary | ICD-10-CM | POA: Diagnosis not present

## 2023-11-12 DIAGNOSIS — E538 Deficiency of other specified B group vitamins: Secondary | ICD-10-CM

## 2023-11-12 DIAGNOSIS — K582 Mixed irritable bowel syndrome: Secondary | ICD-10-CM

## 2023-11-12 DIAGNOSIS — M797 Fibromyalgia: Secondary | ICD-10-CM

## 2023-11-12 DIAGNOSIS — R7303 Prediabetes: Secondary | ICD-10-CM | POA: Diagnosis not present

## 2023-11-12 DIAGNOSIS — M62838 Other muscle spasm: Secondary | ICD-10-CM

## 2023-11-12 DIAGNOSIS — R768 Other specified abnormal immunological findings in serum: Secondary | ICD-10-CM

## 2023-11-12 DIAGNOSIS — M7061 Trochanteric bursitis, right hip: Secondary | ICD-10-CM

## 2023-11-12 DIAGNOSIS — R52 Pain, unspecified: Secondary | ICD-10-CM

## 2023-11-12 DIAGNOSIS — M2550 Pain in unspecified joint: Secondary | ICD-10-CM

## 2023-11-12 DIAGNOSIS — M722 Plantar fascial fibromatosis: Secondary | ICD-10-CM | POA: Diagnosis not present

## 2023-11-12 DIAGNOSIS — E785 Hyperlipidemia, unspecified: Secondary | ICD-10-CM

## 2023-11-12 DIAGNOSIS — M7918 Myalgia, other site: Secondary | ICD-10-CM

## 2023-11-12 DIAGNOSIS — E559 Vitamin D deficiency, unspecified: Secondary | ICD-10-CM

## 2023-11-12 DIAGNOSIS — K219 Gastro-esophageal reflux disease without esophagitis: Secondary | ICD-10-CM

## 2023-11-12 DIAGNOSIS — F411 Generalized anxiety disorder: Secondary | ICD-10-CM

## 2023-11-12 DIAGNOSIS — M7062 Trochanteric bursitis, left hip: Secondary | ICD-10-CM

## 2023-11-12 DIAGNOSIS — R5383 Other fatigue: Secondary | ICD-10-CM | POA: Diagnosis not present

## 2023-11-12 NOTE — Patient Instructions (Addendum)
 Cervical Strain and Sprain Rehab Ask your health care provider which exercises are safe for you. Do exercises exactly as told by your health care provider and adjust them as directed. It is normal to feel mild stretching, pulling, tightness, or discomfort as you do these exercises. Stop right away if you feel sudden pain or your pain gets worse. Do not begin these exercises until told by your health care provider. Stretching and range-of-motion exercises Cervical side bending  Using good posture, sit on a stable chair or stand up. Without moving your shoulders, slowly tilt your left / right ear to your shoulder until you feel a stretch in the neck muscles on the opposite side. You should be looking straight ahead. Hold for __________ seconds. Repeat with the other side of your neck. Repeat __________ times. Complete this exercise __________ times a day. Cervical rotation  Using good posture, sit on a stable chair or stand up. Slowly turn your head to the side as if you are looking over your left / right shoulder. Keep your eyes level with the ground. Stop when you feel a stretch along the side and the back of your neck. Hold for __________ seconds. Repeat this by turning to your other side. Repeat __________ times. Complete this exercise __________ times a day. Thoracic extension and pectoral stretch  Roll a towel or a small blanket so it is about 4 inches (10 cm) in diameter. Lie down on your back on a firm surface. Put the towel in the middle of your back across your spine. It should not be under your shoulder blades. Put your hands behind your head and let your elbows fall out to your sides. Hold for __________ seconds. Repeat __________ times. Complete this exercise __________ times a day. Strengthening exercises Upper cervical flexion  Lie on your back with a thin pillow behind your head or a small, rolled-up towel under your neck. Gently tuck your chin toward your chest and nod  your head down to look toward your feet. Do not lift your head off the pillow. Hold for __________ seconds. Release the tension slowly. Relax your neck muscles completely before you repeat this exercise. Repeat __________ times. Complete this exercise __________ times a day. Cervical extension  Stand about 6 inches (15 cm) away from a wall, with your back facing the wall. Place a soft object, about 6-8 inches (15-20 cm) in diameter, between the back of your head and the wall. A soft object could be a small pillow, a ball, or a folded towel. Gently tilt your head back and press into the soft object. Keep your jaw and forehead relaxed. Hold for __________ seconds. Release the tension slowly. Relax your neck muscles completely before you repeat this exercise. Repeat __________ times. Complete this exercise __________ times a day. Posture and body mechanics Body mechanics refer to the movements and positions of your body while you do your daily activities. Posture is part of body mechanics. Good posture and healthy body mechanics can help to relieve stress in your body's tissues and joints. Good posture means that your spine is in its natural S-curve position (your spine is neutral), your shoulders are pulled back slightly, and your head is not tipped forward. The following are general guidelines for using improved posture and body mechanics in your everyday activities. Sitting  When sitting, keep your spine neutral and keep your feet flat on the floor. Use a footrest, if needed, and keep your thighs parallel to the floor. Avoid rounding  your shoulders. Avoid tilting your head forward. When working at a desk or a computer, keep your desk at a height where your hands are slightly lower than your elbows. Slide your chair under your desk so you are close enough to maintain good posture. When working at a computer, place your monitor at a height where you are looking straight ahead and you do not have to  tilt your head forward or downward to look at the screen. Standing  When standing, keep your spine neutral and keep your feet about hip-width apart. Keep a slight bend in your knees. Your ears, shoulders, and hips should line up. When you do a task in which you stand in one place for a long time, place one foot up on a stable object that is 2-4 inches (5-10 cm) high, such as a footstool. This helps keep your spine neutral. Resting When lying down and resting, avoid positions that are most painful for you. Try to support your neck in a neutral position. You can use a contour pillow or a small rolled-up towel. Your pillow should support your neck but not push on it. This information is not intended to replace advice given to you by your health care provider. Make sure you discuss any questions you have with your health care provider. Document Revised: 11/30/2022 Document Reviewed: 02/16/2022 Elsevier Patient Education  2024 [employer] Iliotibial Band Syndrome Rehab Ask your health care provider which exercises are safe for you. Do exercises exactly as told by your provider and adjust them as told. It's normal to feel mild stretching, pulling, tightness, or discomfort as you do these exercises. Stop right away if you feel sudden pain or your pain gets a lot worse. Do not begin these exercises until told by your provider. Stretching and range-of-motion exercises These exercises warm up your muscles and joints. They also improve the movement and flexibility of your hip and pelvis. Quadriceps stretch, prone  Lie face down (prone) on a firm surface like a bed or padded floor. Bend your left / right knee. Reach back to hold your ankle or pant leg. If you can't reach your ankle or pant leg, use a belt looped around your foot and grab the belt instead. Gently pull your heel toward your butt. Your knee should not slide out to the side. You should feel a stretch in the front of your thigh and knee, also  called the quadriceps. Hold this position for __________ seconds. Repeat __________ times. Complete this exercise __________ times a day. Iliotibial band stretch The iliotibial band is a strip of tissue that runs along the outside of your hip down to your knee. Lie on your side with your left / right leg on top. Bend both knees and grab your left / right ankle. Stretch out your bottom arm to help you balance. Slowly bring your top knee back so your thigh goes behind your back. Slowly lower your top leg toward the floor until you feel a gentle stretch on the outside of your left / right hip and thigh. If you don't feel a stretch and your knee won't go farther, place the heel of your other foot on top of your knee and pull your knee down toward the floor with your foot. Hold this position for __________ seconds. Repeat __________ times. Complete this exercise __________ times a day. Strengthening exercises These exercises build strength and endurance in your hip and pelvis. Endurance means your muscles can keep working even when they're  tired. Straight leg raises, side-lying This exercise strengthens the muscles that rotate the leg at the hip and move it away from your body. These muscles are called hip abductors. Lie on your side with your left / right leg on top. Lie so your head, shoulder, hip, and knee line up. You can bend your bottom knee to help you balance. Roll your hips slightly forward so they're stacked directly over each other. Your left / right knee should face forward. Tense the muscles in your outer thigh and hip. Lift your top leg 4-6 inches (10-15 cm) off the ground. Hold this position for __________ seconds. Slowly lower your leg back down to the starting position. Let your muscles fully relax before doing this exercise again. Repeat __________ times. Complete this exercise __________ times a day. Leg raises, prone This exercise strengthens the muscles that move the hips  backward. These muscles are called hip extensors. Lie face down (prone) on your bed or a firm surface. You can put a pillow under your hips for comfort and to support your lower back. Bend your left / right knee so your foot points straight up toward the ceiling. Keep the other leg straight and behind you. Squeeze your butt muscles. Lift your left / right thigh off the firm surface. Do not let your back arch. Tense your thigh muscle as hard as you can without having more knee pain. Hold this position for __________ seconds. Slowly lower your leg to the starting position. Allow your leg to relax all the way. Repeat __________ times. Complete this exercise __________ times a day. Hip hike  Stand sideways on a bottom step. Place your feet so that your left / right leg is on the step, and the other foot is hanging off the side. If you need support for balance, hold onto a railing or wall. Keep your knees straight and your abdomen square, meaning your hips are level. Then, lift your left / right hip up toward the ceiling. Slowly let your leg that's hanging off the step lower towards the floor. Your foot should get closer to the ground. Do not lean or bend your knees during this movement. Repeat __________ times. Complete this exercise __________ times a day. This information is not intended to replace advice given to you by your health care provider. Make sure you discuss any questions you have with your health care provider. Document Revised: 10/09/2022 Document Reviewed: 10/09/2022 Elsevier Patient Education  2024 [employer].

## 2023-11-23 ENCOUNTER — Ambulatory Visit: Payer: Commercial Managed Care - PPO | Admitting: Rheumatology

## 2023-11-28 NOTE — Patient Instructions (Addendum)
 It was great to see you again today!   I will be in touch with your labs asap Try taking 100 mg of sertraline  for a week or so- let me know what you think.  If you like the increased dose I can send in an rx for the 100 mg strength.    Cologuard due in 2 years   Please see me in 6 months if needed, a year if all is going well!

## 2023-11-28 NOTE — Progress Notes (Signed)
 [facility] [address], [address] [place], [place] [address] [phone]  Date:  12/06/2023   Name:  Tricia [name]   DOB:  09-07-1975   MRN:  [id number]  PCP:  Kaylee Partridge, MD    Chief Complaint: Annual Exam (Concerns/ questions: none/Due: colonoscopy due (cologuard done last year and was WNL), Mammogram)   History of Present Illness:  Tricia [name] is a 48 y.o. very pleasant female patient who presents with the following:  Patient seen today for physical exam; history of prediabetes, anxiety and depression Most recent visit with myself was in October when she was struggling with some social anxiety and mood variability We did a GeneSight test at that time and switched her over from Cymbalta  to Pristiq  We then switched to zoloft ; she is just on 50 mg.  This is working okay for her but not great She might like to try going up to a higher dosage to see if it helps -we will have her try taking 100 mg  She also has history of B12 deficiency-B12 level was low last year.  She is taking OTC B12.  She does not always take her vitamin D  however We found her to the very mildly positive ANA last year; she was seen by rheumatology recently-thought to have likely fibromyalgia but no autoimmune disorder.  No synovitis noted on exam  Mammogram about 1 year ago- done per GYN  Pap last year per her gynecologist Pt believe she had a cologuard last year per her GYN which was negative No family history of colon cancer  Blood work can be updated today No propranolol  this am   Wt Readings from Last 3 Encounters:  12/06/23 177 lb 3.2 oz (80.4 kg)  11/12/23 176 lb 9.6 oz (80.1 kg)  10/26/23 177 lb (80.3 kg)   She is trying to get more exercise -she is walking on her treadmill every day and notes she has lost about 10 pounds  The 10-year ASCVD risk score (Arnett DK, et al., 2019) is: 0.5%   Values used to calculate the score:      Age: 61 years     Sex: Female     Is Non-Hispanic African American: No     Diabetic: No     Tobacco smoker: No     Systolic Blood Pressure: 100 mmHg     Is BP treated: No     HDL Cholesterol: 65.2 mg/dL     Total Cholesterol: 217 mg/dL    BP Readings from Last 3 Encounters:  12/06/23 100/62  11/12/23 114/78  10/26/23 116/82    Patient Active Problem List   Diagnosis Date Noted   Prediabetes 11/28/2022   B12 deficiency 11/28/2022   Near syncope 12/07/2018   Nonspecific abnormal electrocardiogram (ECG) (EKG) 12/07/2018   Closed fracture of base of fifth metacarpal bone of right hand with routine healing, subsequent encounter 12/06/2018   Plantar fasciitis of right foot 09/28/2017   GAD (generalized anxiety disorder) 05/19/2017    Past Medical History:  Diagnosis Date   Allergic rhinitis    Anxiety    Depression    History of chicken pox    History of shingles    Hyperlipidemia     Past Surgical History:  Procedure Laterality Date   CHOLECYSTECTOMY     HERNIA REPAIR     wisdome teeth      Social History   Tobacco Use  Smoking status: Former    Types: Cigarettes    Passive exposure: Never   Smokeless tobacco: Never  Vaping Use   Vaping status: Never Used  Substance Use Topics   Alcohol use: Yes    Comment: occ   Drug use: No    Family History  Problem Relation Age of Onset   Sleep apnea Father    Heart disease Father    Atrial fibrillation Father    Colitis Brother    Heart disease Brother    Hypertension Maternal Grandmother    Hypertension Maternal Grandfather    Hypertension Paternal Grandmother    Heart disease Paternal Grandmother    Hypertension Paternal Grandfather    Heart disease Paternal Grandfather    ADD / ADHD Son    Asthma Son    Anesthesia problems Neg Hx    Hypotension Neg Hx    Pseudochol deficiency Neg Hx    Malignant hyperthermia Neg Hx    Colon cancer Neg Hx    Pancreatic cancer Neg Hx    Esophageal cancer Neg Hx     Stomach cancer Neg Hx    Liver disease Neg Hx     Allergies  Allergen Reactions   Sulfa Antibiotics     Childhood reaction    Medication list has been reviewed and updated.  Current Outpatient Medications on File Prior to Visit  Medication Sig Dispense Refill   Cyanocobalamin  (VITAMIN B 12 PO) Take by mouth.     cyclobenzaprine  (FLEXERIL ) 10 MG tablet Take 1 tablet (10 mg total) by mouth 3 (three) times daily as needed for muscle spasms. 30 tablet 1   famotidine (PEPCID) 40 MG tablet Take 40 mg by mouth daily.     levonorgestrel  (MIRENA , 52 MG,) 20 MCG/DAY IUD Take by intrauterine route.     sertraline  (ZOLOFT ) 50 MG tablet Take 1 tablet (50 mg total) by mouth daily. 30 tablet 3   simvastatin  (ZOCOR ) 20 MG tablet TAKE 1 TABLET BY MOUTH AT BEDTIME 90 tablet 3   VITAMIN D  PO Take by mouth.     No current facility-administered medications on file prior to visit.    Review of Systems:  As per HPI- otherwise negative.   Physical Examination: Vitals:   12/06/23 0856  BP: 100/62  Pulse: 77  Resp: 18  Temp: 97.9 F (36.6 C)  SpO2: 97%   Vitals:   12/06/23 0856  Weight: 177 lb 3.2 oz (80.4 kg)  Height: 5\' 2"  (1.575 m)   Body mass index is 32.41 kg/m. Ideal Body Weight: Weight in (lb) to have BMI = 25: 136.4  GEN: no acute distress.  Mildly obese, looks well HEENT: Atraumatic, Normocephalic.  Bilateral TM wnl, oropharynx normal.  PEERL,EOMI.   Ears and Nose: No external deformity. CV: RRR, No M/G/R. No JVD. No thrill. No extra heart sounds. PULM: CTA B, no wheezes, crackles, rhonchi. No retractions. No resp. distress. No accessory muscle use. ABD: S, NT, +BS. No rebound. No HSM. EXTR: No c/c/e PSYCH: Normally interactive. Conversant.    Assessment and Plan: Physical exam  Thyroid  disorder screening - Plan: TSH  Screening for deficiency anemia  Fatigue, unspecified type - Plan: CBC, TSH, VITAMIN D  25 Hydroxy (Vit-D Deficiency, Fractures), Vitamin  B12  Dyslipidemia - Plan: Lipid panel  Vitamin D  deficiency - Plan: VITAMIN D  25 Hydroxy (Vit-D Deficiency, Fractures)  B12 deficiency - Plan: Vitamin B12  Prediabetes - Plan: Comprehensive metabolic panel with GFR, Hemoglobin A1c  Social anxiety disorder - Plan:  propranolol  (INDERAL ) 10 MG tablet   Physical exam today.  Encouraged healthy diet and exercise routine Will plan further follow- up pending labs. Monitor her B12 and vitamin D  levels today Noted borderline low blood pressure.  Patient feels fine, she is not lightheaded.  She has not taken beta-blocker today Will have her try taking sertraline  100 mg for a couple of weeks and let me know, I can prescribe the 100 mg strength if she likes this better.  We can also go back to Cymbalta  at some point if she would rather   Signed Gates Kasal, MD  Received labs as below, message to patient Results for orders placed or performed in visit on 12/06/23  Cologuard   Collection Time: 11/30/22 12:00 AM  Result Value Ref Range   Cologuard Negative Negative  CBC   Collection Time: 12/06/23  9:24 AM  Result Value Ref Range   WBC 9.1 4.0 - 10.5 K/uL   RBC 4.34 3.87 - 5.11 Mil/uL   Platelets 312.0 150.0 - 400.0 K/uL   Hemoglobin 14.4 12.0 - 15.0 g/dL   HCT 16.1 09.6 - 04.5 %   MCV 99.2 78.0 - 100.0 fl   MCHC 33.5 30.0 - 36.0 g/dL   RDW 40.9 81.1 - 91.4 %  Comprehensive metabolic panel with GFR   Collection Time: 12/06/23  9:24 AM  Result Value Ref Range   Sodium 138 135 - 145 mEq/L   Potassium 4.1 3.5 - 5.1 mEq/L   Chloride 102 96 - 112 mEq/L   CO2 27 19 - 32 mEq/L   Glucose, Bld 103 (H) 70 - 99 mg/dL   BUN 12 6 - 23 mg/dL   Creatinine, Ser 7.82 0.40 - 1.20 mg/dL   Total Bilirubin 0.8 0.2 - 1.2 mg/dL   Alkaline Phosphatase 75 39 - 117 U/L   AST 18 0 - 37 U/L   ALT 17 0 - 35 U/L   Total Protein 6.9 6.0 - 8.3 g/dL   Albumin 4.1 3.5 - 5.2 g/dL   GFR 956.21 >30.86 mL/min   Calcium  9.3 8.4 - 10.5 mg/dL  Hemoglobin V7Q    Collection Time: 12/06/23  9:24 AM  Result Value Ref Range   Hgb A1c MFr Bld 5.8 4.6 - 6.5 %  Lipid panel   Collection Time: 12/06/23  9:24 AM  Result Value Ref Range   Cholesterol 217 (H) 0 - 200 mg/dL   Triglycerides 469.6 (H) 0.0 - 149.0 mg/dL   HDL 29.52 >84.13 mg/dL   VLDL 24.4 (H) 0.0 - 01.0 mg/dL   LDL Cholesterol 272 (H) 0 - 99 mg/dL   Total CHOL/HDL Ratio 3    NonHDL 152.05   TSH   Collection Time: 12/06/23  9:24 AM  Result Value Ref Range   TSH 1.05 0.35 - 5.50 uIU/mL  VITAMIN D  25 Hydroxy (Vit-D Deficiency, Fractures)   Collection Time: 12/06/23  9:24 AM  Result Value Ref Range   VITD 22.73 (L) 30.00 - 100.00 ng/mL  Vitamin B12   Collection Time: 12/06/23  9:24 AM  Result Value Ref Range   Vitamin B-12 481 211 - 911 pg/mL

## 2023-12-03 ENCOUNTER — Other Ambulatory Visit (HOSPITAL_COMMUNITY): Payer: Self-pay

## 2023-12-03 ENCOUNTER — Other Ambulatory Visit: Payer: Self-pay

## 2023-12-03 ENCOUNTER — Other Ambulatory Visit: Payer: Self-pay | Admitting: Family Medicine

## 2023-12-03 DIAGNOSIS — E785 Hyperlipidemia, unspecified: Secondary | ICD-10-CM

## 2023-12-03 MED ORDER — SIMVASTATIN 20 MG PO TABS
ORAL_TABLET | Freq: Every day | ORAL | 3 refills | Status: AC
  Filled 2023-12-03: qty 90, 90d supply, fill #0
  Filled 2024-02-28: qty 90, 90d supply, fill #1
  Filled 2024-05-26: qty 90, 90d supply, fill #2
  Filled 2024-08-24: qty 90, 90d supply, fill #3

## 2023-12-06 ENCOUNTER — Ambulatory Visit (INDEPENDENT_AMBULATORY_CARE_PROVIDER_SITE_OTHER): Payer: Commercial Managed Care - PPO | Admitting: Family Medicine

## 2023-12-06 ENCOUNTER — Encounter: Payer: Self-pay | Admitting: Family Medicine

## 2023-12-06 ENCOUNTER — Other Ambulatory Visit (HOSPITAL_COMMUNITY): Payer: Self-pay

## 2023-12-06 VITALS — BP 100/62 | HR 77 | Temp 97.9°F | Resp 18 | Ht 62.0 in | Wt 177.2 lb

## 2023-12-06 DIAGNOSIS — F4010 Social phobia, unspecified: Secondary | ICD-10-CM | POA: Diagnosis not present

## 2023-12-06 DIAGNOSIS — E785 Hyperlipidemia, unspecified: Secondary | ICD-10-CM | POA: Diagnosis not present

## 2023-12-06 DIAGNOSIS — R7303 Prediabetes: Secondary | ICD-10-CM

## 2023-12-06 DIAGNOSIS — Z0000 Encounter for general adult medical examination without abnormal findings: Secondary | ICD-10-CM

## 2023-12-06 DIAGNOSIS — Z1329 Encounter for screening for other suspected endocrine disorder: Secondary | ICD-10-CM | POA: Diagnosis not present

## 2023-12-06 DIAGNOSIS — E559 Vitamin D deficiency, unspecified: Secondary | ICD-10-CM | POA: Diagnosis not present

## 2023-12-06 DIAGNOSIS — R5383 Other fatigue: Secondary | ICD-10-CM | POA: Diagnosis not present

## 2023-12-06 DIAGNOSIS — E538 Deficiency of other specified B group vitamins: Secondary | ICD-10-CM | POA: Diagnosis not present

## 2023-12-06 DIAGNOSIS — Z130 Encounter for screening for diseases of the blood and blood-forming organs and certain disorders involving the immune mechanism: Secondary | ICD-10-CM | POA: Diagnosis not present

## 2023-12-06 LAB — COMPREHENSIVE METABOLIC PANEL WITH GFR
ALT: 17 U/L (ref 0–35)
AST: 18 U/L (ref 0–37)
Albumin: 4.1 g/dL (ref 3.5–5.2)
Alkaline Phosphatase: 75 U/L (ref 39–117)
BUN: 12 mg/dL (ref 6–23)
CO2: 27 meq/L (ref 19–32)
Calcium: 9.3 mg/dL (ref 8.4–10.5)
Chloride: 102 meq/L (ref 96–112)
Creatinine, Ser: 0.71 mg/dL (ref 0.40–1.20)
GFR: 101.87 mL/min (ref >60.00)
Glucose, Bld: 103 mg/dL — ABNORMAL HIGH (ref 70–99)
Potassium: 4.1 meq/L (ref 3.5–5.1)
Sodium: 138 meq/L (ref 135–145)
Total Bilirubin: 0.8 mg/dL (ref 0.2–1.2)
Total Protein: 6.9 g/dL (ref 6.0–8.3)

## 2023-12-06 LAB — CBC
HCT: 43 % (ref 36.0–46.0)
Hemoglobin: 14.4 g/dL (ref 12.0–15.0)
MCHC: 33.5 g/dL (ref 30.0–36.0)
MCV: 99.2 fl (ref 78.0–100.0)
Platelets: 312 10*3/uL (ref 150.0–400.0)
RBC: 4.34 Mil/uL (ref 3.87–5.11)
RDW: 13.5 % (ref 11.5–15.5)
WBC: 9.1 10*3/uL (ref 4.0–10.5)

## 2023-12-06 LAB — VITAMIN B12: Vitamin B-12: 481 pg/mL (ref 211–911)

## 2023-12-06 LAB — LIPID PANEL
Cholesterol: 217 mg/dL — ABNORMAL HIGH (ref 0–200)
HDL: 65.2 mg/dL (ref >39.00)
LDL Cholesterol: 111 mg/dL — ABNORMAL HIGH (ref 0–99)
NonHDL: 152.05
Total CHOL/HDL Ratio: 3
Triglycerides: 207 mg/dL — ABNORMAL HIGH (ref 0.0–149.0)
VLDL: 41.4 mg/dL — ABNORMAL HIGH (ref 0.0–40.0)

## 2023-12-06 LAB — HEMOGLOBIN A1C: Hgb A1c MFr Bld: 5.8 % (ref 4.6–6.5)

## 2023-12-06 LAB — TSH: TSH: 1.05 u[IU]/mL (ref 0.35–5.50)

## 2023-12-06 LAB — VITAMIN D 25 HYDROXY (VIT D DEFICIENCY, FRACTURES): VITD: 22.73 ng/mL — ABNORMAL LOW (ref 30.00–100.00)

## 2023-12-06 MED ORDER — PROPRANOLOL HCL 10 MG PO TABS
10.0000 mg | ORAL_TABLET | Freq: Two times a day (BID) | ORAL | 1 refills | Status: AC
  Filled 2023-12-06 – 2024-01-17 (×3): qty 60, 15d supply, fill #0

## 2023-12-17 ENCOUNTER — Other Ambulatory Visit (HOSPITAL_COMMUNITY): Payer: Self-pay

## 2023-12-27 ENCOUNTER — Other Ambulatory Visit (HOSPITAL_COMMUNITY): Payer: Self-pay

## 2023-12-27 ENCOUNTER — Encounter: Payer: Self-pay | Admitting: Family Medicine

## 2023-12-27 MED ORDER — SERTRALINE HCL 100 MG PO TABS
100.0000 mg | ORAL_TABLET | Freq: Every day | ORAL | 3 refills | Status: AC
  Filled 2023-12-27: qty 90, 90d supply, fill #0
  Filled 2024-03-23: qty 90, 90d supply, fill #1
  Filled 2024-06-26: qty 90, 90d supply, fill #2

## 2023-12-27 NOTE — Telephone Encounter (Signed)
 Pended, sign if appropriate.

## 2024-01-05 ENCOUNTER — Other Ambulatory Visit (HOSPITAL_COMMUNITY): Payer: Self-pay

## 2024-01-17 ENCOUNTER — Other Ambulatory Visit (HOSPITAL_COMMUNITY): Payer: Self-pay

## 2024-01-17 ENCOUNTER — Encounter (HOSPITAL_COMMUNITY): Payer: Self-pay

## 2024-01-18 ENCOUNTER — Other Ambulatory Visit: Payer: Self-pay

## 2024-01-24 DIAGNOSIS — H5201 Hypermetropia, right eye: Secondary | ICD-10-CM | POA: Diagnosis not present

## 2024-01-24 DIAGNOSIS — H52223 Regular astigmatism, bilateral: Secondary | ICD-10-CM | POA: Diagnosis not present

## 2024-01-24 DIAGNOSIS — H524 Presbyopia: Secondary | ICD-10-CM | POA: Diagnosis not present

## 2024-04-06 ENCOUNTER — Encounter: Payer: Self-pay | Admitting: Family Medicine

## 2024-04-13 ENCOUNTER — Ambulatory Visit (INDEPENDENT_AMBULATORY_CARE_PROVIDER_SITE_OTHER): Admitting: Obstetrics and Gynecology

## 2024-04-13 ENCOUNTER — Other Ambulatory Visit (HOSPITAL_COMMUNITY)
Admission: RE | Admit: 2024-04-13 | Discharge: 2024-04-13 | Disposition: A | Discharge status: Home / Self Care | Source: Ambulatory Visit | Attending: Obstetrics and Gynecology | Admitting: Obstetrics and Gynecology

## 2024-04-13 ENCOUNTER — Encounter: Payer: Self-pay | Admitting: Obstetrics and Gynecology

## 2024-04-13 VITALS — BP 113/77 | HR 72 | Ht 61.0 in | Wt 179.0 lb

## 2024-04-13 DIAGNOSIS — Z1331 Encounter for screening for depression: Secondary | ICD-10-CM

## 2024-04-13 DIAGNOSIS — Z124 Encounter for screening for malignant neoplasm of cervix: Secondary | ICD-10-CM | POA: Diagnosis not present

## 2024-04-13 DIAGNOSIS — Z01419 Encounter for gynecological examination (general) (routine) without abnormal findings: Secondary | ICD-10-CM | POA: Diagnosis not present

## 2024-04-13 DIAGNOSIS — N6323 Unspecified lump in the left breast, lower outer quadrant: Secondary | ICD-10-CM

## 2024-04-13 DIAGNOSIS — Z1151 Encounter for screening for human papillomavirus (HPV): Secondary | ICD-10-CM | POA: Insufficient documentation

## 2024-04-13 DIAGNOSIS — Z113 Encounter for screening for infections with a predominantly sexual mode of transmission: Secondary | ICD-10-CM | POA: Diagnosis not present

## 2024-04-13 NOTE — Progress Notes (Addendum)
 48 y.o. New GYN presents for AEX/PAP/STD screening.  C/o spot on L breast.

## 2024-04-13 NOTE — Progress Notes (Signed)
 ANNUAL EXAM Patient name: Tricia [name] MRN [id number]  Date of birth: [birth year] Chief Complaint:   Gynecologic Exam (NEW GYN)  History of Present Illness:   Tricia [name] is a 48 y.o. [phone] with No LMP recorded. (Menstrual status: IUD). being seen today for a routine annual exam.  Current complaints:   - Breast lump - first noticed ~9-10 months ago, drained superficially but seems to have returned. Not draining - Some occ pain w/ IC - More discharge/spotting. IUD in place since 2019  Last pap 10/30/21. Results were: ASCUS w/ HRHPV negative. Nothing requiring colpo/LEEP Last mammogram: 11/30/22. BI-RADS 1 Last colonoscopy: 12/16/22 - cologuard. Results were: normal HPV vaccine: n/a     04/13/2024   11:22 AM 03/04/2023    8:32 AM 11/30/2022   10:39 AM 12/01/2021   11:16 AM 04/30/2021   11:23 AM  Depression screen PHQ 2/9  Decreased Interest 2 1 0 2 0  Down, Depressed, Hopeless 0 1 0 1 0  PHQ - 2 Score 2 2 0 3 0  Altered sleeping 3 1  1    Tired, decreased energy 3 3  3    Change in appetite 0 1  0   Feeling bad or failure about yourself  0 1  0   Trouble concentrating 1 0  0   Moving slowly or fidgety/restless 0 0  0   Suicidal thoughts 0 0  0   PHQ-9 Score 9 8  7    Difficult doing work/chores Not difficult at all --  Somewhat difficult         04/13/2024   11:23 AM 03/04/2023    8:33 AM  GAD 7 : Generalized Anxiety Score  Nervous, Anxious, on Edge 1 2  Control/stop worrying 3 3  Worry too much - different things 3 3  Trouble relaxing 1 0  Restless 0 1  Easily annoyed or irritable 1 1  Afraid - awful might happen 1 3  Total GAD 7 Score 10 13  Anxiety Difficulty Not difficult at all --     Review of Systems:   Pertinent items are noted in HPI Denies any headaches, blurred vision, fatigue, shortness of breath, chest pain, abdominal pain, abnormal vaginal discharge/itching/odor/irritation, problems with periods, bowel movements, urination, or intercourse unless  otherwise stated above. Pertinent History Reviewed:  Reviewed past medical,surgical, social and family history.  Reviewed problem list, medications and allergies. Physical Assessment:   Vitals:   04/13/24 1118  BP: 113/77  Pulse: 72  Weight: 179 lb (81.2 kg)  Height: 5' 1 (1.549 m)  Body mass index is 33.82 kg/m.        Physical Examination:   General appearance - well appearing, and in no distress  Mental status - alert, oriented to person, place, and time  Chest - respiratory effort normal  Heart - normal peripheral perfusion  Breasts - breasts appear normal. Small (<1cm), firm superficial nodule in the lower outer quadrant of the left breast without overlying skin changes; no palpable deeper mass/lesion.  no nipple changes or axillary nodes  Abdomen - soft, nontender, non distended  Pelvic - VULVA: normal appearing vulva with no masses, tenderness or lesions  VAGINA: normal appearing vagina with normal color and discharge, no lesions. No significant levator or obturator tenderness.  CERVIX: normal appearing cervix without discharge or lesions, no CMT  Thin prep pap is done with HR HPV cotesting  UTERUS: uterus is felt to be normal size, shape, consistency and nontender . IUD  strings palpable  ADNEXA: No adnexal masses or tenderness noted.  Chaperone present for exam  No results found for this or any previous visit (from the past 24 hours).  Assessment & Plan:  1) Well-Woman Exam Mammogram: diagnostic MMG ordered, due for screening AND has palpable lesion on left breast though suspect this may be a sebaceous cyst Colonoscopy: cologuard due 2027 Pap: Collected Gardasil: n/a GC/CT: collected HIV/HCV: ordered Discussed option for IUD removal/reinsertion now that it is past 5 year mark and more BTB is expected No sig abnormality on exam, but reviewed that dyspareunia may be related to muscle spasm after IC and PFPT could be helpful. Defer for now  Labs/procedures today:    Orders Placed This Encounter  Procedures   MM 3D DIAGNOSTIC MAMMOGRAM BILATERAL BREAST   HIV antibody (with reflex)   Hepatitis C Antibody   Hepatitis B Surface AntiGEN   RPR   Meds: No orders of the defined types were placed in this encounter.  Follow-up: Return in about 1 year (around 04/13/2025) for annual exam or sooner prn.  Kieth JAYSON Carolin, MD 04/13/2024 5:38 PM

## 2024-04-14 ENCOUNTER — Other Ambulatory Visit: Payer: Self-pay | Admitting: *Deleted

## 2024-04-14 ENCOUNTER — Ambulatory Visit: Payer: Self-pay | Admitting: Obstetrics and Gynecology

## 2024-04-14 ENCOUNTER — Other Ambulatory Visit: Payer: Self-pay | Admitting: Obstetrics and Gynecology

## 2024-04-14 DIAGNOSIS — N6323 Unspecified lump in the left breast, lower outer quadrant: Secondary | ICD-10-CM

## 2024-04-14 DIAGNOSIS — G5603 Carpal tunnel syndrome, bilateral upper limbs: Secondary | ICD-10-CM

## 2024-04-14 LAB — CERVICOVAGINAL ANCILLARY ONLY
Chlamydia: NEGATIVE
Comment: NEGATIVE
Comment: NEGATIVE
Comment: NORMAL
Neisseria Gonorrhea: NEGATIVE
Trichomonas: NEGATIVE

## 2024-04-14 LAB — RPR: RPR Ser Ql: NONREACTIVE

## 2024-04-14 LAB — HEPATITIS C ANTIBODY: Hep C Virus Ab: NONREACTIVE

## 2024-04-14 LAB — HEPATITIS B SURFACE ANTIGEN: Hepatitis B Surface Ag: NEGATIVE

## 2024-04-14 LAB — HIV ANTIBODY (ROUTINE TESTING W REFLEX): HIV Screen 4th Generation wRfx: NONREACTIVE

## 2024-04-17 LAB — CYTOLOGY - PAP
Comment: NEGATIVE
Diagnosis: NEGATIVE
High risk HPV: NEGATIVE

## 2024-04-20 ENCOUNTER — Other Ambulatory Visit: Payer: Self-pay | Admitting: *Deleted

## 2024-04-20 DIAGNOSIS — G5603 Carpal tunnel syndrome, bilateral upper limbs: Secondary | ICD-10-CM

## 2024-04-27 ENCOUNTER — Other Ambulatory Visit

## 2024-04-27 ENCOUNTER — Encounter

## 2024-05-02 ENCOUNTER — Ambulatory Visit
Admission: RE | Admit: 2024-05-02 | Discharge: 2024-05-02 | Disposition: A | Discharge status: Home / Self Care | Source: Ambulatory Visit | Attending: Obstetrics and Gynecology | Admitting: Obstetrics and Gynecology

## 2024-05-02 ENCOUNTER — Ambulatory Visit
Admission: RE | Admit: 2024-05-02 | Discharge: 2024-05-02 | Disposition: A | Discharge status: Home / Self Care | Source: Ambulatory Visit | Attending: Obstetrics and Gynecology

## 2024-05-02 DIAGNOSIS — N6323 Unspecified lump in the left breast, lower outer quadrant: Secondary | ICD-10-CM

## 2024-05-02 DIAGNOSIS — N6489 Other specified disorders of breast: Secondary | ICD-10-CM | POA: Diagnosis not present

## 2024-05-02 DIAGNOSIS — R928 Other abnormal and inconclusive findings on diagnostic imaging of breast: Secondary | ICD-10-CM | POA: Diagnosis not present

## 2024-05-10 ENCOUNTER — Other Ambulatory Visit (HOSPITAL_COMMUNITY)
Admission: RE | Admit: 2024-05-10 | Discharge: 2024-05-10 | Disposition: A | Discharge status: Home / Self Care | Source: Ambulatory Visit | Attending: Obstetrics and Gynecology | Admitting: Obstetrics and Gynecology

## 2024-05-10 ENCOUNTER — Ambulatory Visit (INDEPENDENT_AMBULATORY_CARE_PROVIDER_SITE_OTHER)

## 2024-05-10 DIAGNOSIS — N898 Other specified noninflammatory disorders of vagina: Secondary | ICD-10-CM | POA: Diagnosis not present

## 2024-05-10 NOTE — Progress Notes (Signed)
 SUBJECTIVE:  48 y.o. female complains of vaginal irritation for 4 day(s). Denies abnormal vaginal bleeding or significant pelvic pain or fever. No UTI symptoms. Denies history of known exposure to STD.  No LMP recorded. (Menstrual status: IUD).  OBJECTIVE:  She appears well, afebrile. Urine dipstick: not done.  ASSESSMENT:  Vaginal Irritation   PLAN:  GC, chlamydia, trichomonas, BVAG, CVAG probe sent to lab. Treatment: To be determined once lab results are received ROV prn if symptoms persist or worsen.

## 2024-05-11 LAB — CERVICOVAGINAL ANCILLARY ONLY
Bacterial Vaginitis (gardnerella): POSITIVE — AB
Candida Glabrata: NEGATIVE
Candida Vaginitis: POSITIVE — AB
Chlamydia: NEGATIVE
Comment: NEGATIVE
Comment: NEGATIVE
Comment: NEGATIVE
Comment: NEGATIVE
Comment: NEGATIVE
Comment: NORMAL
Neisseria Gonorrhea: NEGATIVE
Trichomonas: NEGATIVE

## 2024-05-12 ENCOUNTER — Ambulatory Visit: Payer: Self-pay | Admitting: Obstetrics and Gynecology

## 2024-05-12 ENCOUNTER — Encounter: Payer: Self-pay | Admitting: Physical Medicine & Rehabilitation

## 2024-05-12 ENCOUNTER — Other Ambulatory Visit (HOSPITAL_COMMUNITY): Payer: Self-pay

## 2024-05-12 MED ORDER — FLUCONAZOLE 150 MG PO TABS
150.0000 mg | ORAL_TABLET | Freq: Once | ORAL | 0 refills | Status: AC
  Filled 2024-05-12: qty 1, 1d supply, fill #0

## 2024-05-12 MED ORDER — METRONIDAZOLE 500 MG PO TABS
500.0000 mg | ORAL_TABLET | Freq: Two times a day (BID) | ORAL | 0 refills | Status: DC
  Filled 2024-05-12: qty 14, 7d supply, fill #0

## 2024-05-13 ENCOUNTER — Other Ambulatory Visit (HOSPITAL_COMMUNITY): Payer: Self-pay

## 2024-05-17 ENCOUNTER — Other Ambulatory Visit (HOSPITAL_COMMUNITY): Payer: Self-pay

## 2024-05-18 ENCOUNTER — Other Ambulatory Visit (HOSPITAL_COMMUNITY): Payer: Self-pay

## 2024-06-22 ENCOUNTER — Other Ambulatory Visit: Payer: Self-pay

## 2024-06-26 ENCOUNTER — Other Ambulatory Visit (HOSPITAL_COMMUNITY): Payer: Self-pay

## 2024-06-26 ENCOUNTER — Other Ambulatory Visit: Payer: Self-pay

## 2024-06-27 ENCOUNTER — Encounter: Attending: Physical Medicine & Rehabilitation | Admitting: Physical Medicine & Rehabilitation

## 2024-06-27 ENCOUNTER — Encounter: Payer: Self-pay | Admitting: Physical Medicine & Rehabilitation

## 2024-06-27 VITALS — BP 110/72 | HR 95 | Ht 61.0 in | Wt 187.6 lb

## 2024-06-27 DIAGNOSIS — R200 Anesthesia of skin: Secondary | ICD-10-CM | POA: Insufficient documentation

## 2024-06-27 NOTE — Progress Notes (Signed)
 48 year old female with bilateral hand numbness going on for several years.  Has had positive ultrasound testing for carpal tunnel and good response to hydrodissection left wrist.  She has several months relief. EMG/NCV ordered to further assess hand numbness. 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 There is no evidence of ulnar neuropathy Needle EMG of the left upper extremity was normal reducing probability of brachial neuropathy or cervical radiculopathy Given the normal EMG of the more symptomatic left upper extremity the EMG testing of the right upper extremity testing was deferred. The patient will follow-up with sports medicine to discuss results. Please see report under media tab.  It takes approximately 1 week to scanned.  Fax report will be sent prior to that time to referring Dr. Follow-up physical medicine rehab on as-needed basis

## 2024-06-30 ENCOUNTER — Encounter: Payer: Self-pay | Admitting: Family Medicine

## 2024-07-12 ENCOUNTER — Encounter: Payer: Self-pay | Admitting: Physical Medicine & Rehabilitation

## 2024-07-17 ENCOUNTER — Ambulatory Visit: Admitting: Family Medicine

## 2024-07-17 ENCOUNTER — Other Ambulatory Visit: Payer: Self-pay

## 2024-07-17 VITALS — BP 126/82 | Ht 61.0 in | Wt 180.0 lb

## 2024-07-17 DIAGNOSIS — G5602 Carpal tunnel syndrome, left upper limb: Secondary | ICD-10-CM

## 2024-07-17 MED ORDER — METHYLPREDNISOLONE ACETATE 40 MG/ML IJ SUSP
40.0000 mg | Freq: Once | INTRAMUSCULAR | Status: AC
  Administered 2024-07-17: 40 mg via INTRA_ARTICULAR

## 2024-07-17 NOTE — Progress Notes (Signed)
 PCP: Copland, Harlene BROCKS, MD  Patient is a 48 y.o. female here for carpal tunnel follow up and injection.  Bilateral carpal tunnel syndrome (L>R) Patient seen last on 2/17 for bilateral carpal tunnel syndrome, noted to have chronic tingling in the 1st through 3rd digits on the left greater than the right. Has not had benefit with splint. Received a left carpal tunnel injection that day on 2/17. Patient experienced improvement, but in August paresthesias resumed. Nerve conduction testing 11/18 showed moderate carpal tunnel L>R. Patient now returns reporting persistent symptoms requesting repeat injection.   Past Medical History:  Diagnosis Date   Allergic rhinitis    Anxiety    Depression    Fibromyalgia    Headache    History of chicken pox    History of shingles    Hyperlipidemia    IBS (irritable bowel syndrome)    Vaginal Pap smear, abnormal     Current Outpatient Medications on File Prior to Visit  Medication Sig Dispense Refill   Cyanocobalamin  (VITAMIN B 12 PO) Take by mouth.     cyclobenzaprine  (FLEXERIL ) 10 MG tablet Take 1 tablet (10 mg total) by mouth 3 (three) times daily as needed for muscle spasms. 30 tablet 1   famotidine (PEPCID) 40 MG tablet Take 40 mg by mouth daily.     levonorgestrel  (MIRENA , 52 MG,) 20 MCG/DAY IUD Take by intrauterine route.     propranolol  (INDERAL ) 10 MG tablet Take 1 - 2 tablets (10 - 20 mg total) by mouth 2 times daily. Use as needed for anxiety 60 tablet 1   sertraline  (ZOLOFT ) 100 MG tablet Take 1 tablet (100 mg total) by mouth daily. 90 tablet 3   simvastatin  (ZOCOR ) 20 MG tablet TAKE 1 TABLET BY MOUTH AT BEDTIME 90 tablet 3   VITAMIN D  PO Take by mouth.     No current facility-administered medications on file prior to visit.    Past Surgical History:  Procedure Laterality Date   CHOLECYSTECTOMY     HERNIA REPAIR     wisdome teeth      Allergies  Allergen Reactions   Sulfa Antibiotics     Childhood reaction    BP 126/82    Ht 5' 1 (1.549 m)   Wt 180 lb (81.6 kg)   BMI 34.01 kg/m      09/08/2021    3:42 PM  [facility]  Frequency of aerobic exercise (# of days/week) 0  Average time in minutes 0  Frequency of strengthening activities (# of days/week) 0        No data to display              Objective:  Physical Exam:  Gen: NAD, comfortable in exam room  Wrist exam not repeated today  Assessment and Plan:  Left carpal tunnel syndrome - reviewed diagnosis following her NCV/EMG testing showing moderate left carpal tunnel syndrome - here to repeat injection which was performed today.  Continue wrist brace.  Consider referral to hand surgery if still not improving.  After informed written consent timeout was performed, patient was seated on exam table.  Area overlying left carpal tunnel prepped with alcohol swabs then utilizing ultrasound guidance, patient's left carpal tunnel injected with 2:1 lidocaine : depomedrol.  Patient tolerated procedure well without immediate complications.

## 2024-08-07 ENCOUNTER — Other Ambulatory Visit (HOSPITAL_COMMUNITY)
Admission: RE | Admit: 2024-08-07 | Discharge: 2024-08-07 | Disposition: A | Source: Ambulatory Visit | Attending: Obstetrics and Gynecology | Admitting: Obstetrics and Gynecology

## 2024-08-07 ENCOUNTER — Ambulatory Visit: Admitting: General Practice

## 2024-08-07 DIAGNOSIS — N898 Other specified noninflammatory disorders of vagina: Secondary | ICD-10-CM | POA: Insufficient documentation

## 2024-08-07 NOTE — Progress Notes (Signed)
 SUBJECTIVE:  48 y.o. female complains of white vaginal discharge for 3 month(s). Denies abnormal vaginal bleeding or significant pelvic pain or fever. No UTI symptoms. Denies history of known exposure to STD.  No LMP recorded. (Menstrual status: IUD).  OBJECTIVE:  She appears well, afebrile. Urine dipstick: not done.  ASSESSMENT:  Vaginal Discharge  Vaginal Odor   PLAN:  GC, chlamydia, trichomonas, BVAG, CVAG probe sent to lab. Treatment: To be determined once lab results are received ROV prn if symptoms persist or worsen.

## 2024-08-08 ENCOUNTER — Ambulatory Visit: Payer: Self-pay | Admitting: Obstetrics and Gynecology

## 2024-08-08 ENCOUNTER — Other Ambulatory Visit (HOSPITAL_COMMUNITY): Payer: Self-pay

## 2024-08-08 LAB — CERVICOVAGINAL ANCILLARY ONLY
Bacterial Vaginitis (gardnerella): NEGATIVE
Candida Glabrata: NEGATIVE
Candida Vaginitis: POSITIVE — AB
Chlamydia: NEGATIVE
Comment: NEGATIVE
Comment: NEGATIVE
Comment: NEGATIVE
Comment: NEGATIVE
Comment: NEGATIVE
Comment: NORMAL
Neisseria Gonorrhea: NEGATIVE
Trichomonas: NEGATIVE

## 2024-08-08 MED ORDER — FLUCONAZOLE 150 MG PO TABS
150.0000 mg | ORAL_TABLET | Freq: Once | ORAL | 0 refills | Status: AC
  Filled 2024-08-08: qty 1, 1d supply, fill #0

## 2024-12-06 ENCOUNTER — Encounter: Admitting: Family Medicine
